# Patient Record
Sex: Female | Born: 1970
Health system: Southern US, Community
[De-identification: ages and names within clinical notes are randomized; demographics above are authoritative.]

## PROBLEM LIST (undated history)

## (undated) DIAGNOSIS — IMO0002 Reserved for concepts with insufficient information to code with codable children: Secondary | ICD-10-CM

## (undated) DIAGNOSIS — Z789 Other specified health status: Secondary | ICD-10-CM

## (undated) DIAGNOSIS — T85618A Breakdown (mechanical) of other specified internal prosthetic devices, implants and grafts, initial encounter: Secondary | ICD-10-CM

## (undated) DIAGNOSIS — R339 Retention of urine, unspecified: Secondary | ICD-10-CM

## (undated) DIAGNOSIS — M1990 Unspecified osteoarthritis, unspecified site: Secondary | ICD-10-CM

## (undated) DIAGNOSIS — R112 Nausea with vomiting, unspecified: Secondary | ICD-10-CM

## (undated) DIAGNOSIS — Z973 Presence of spectacles and contact lenses: Secondary | ICD-10-CM

## (undated) DIAGNOSIS — Z9889 Other specified postprocedural states: Secondary | ICD-10-CM

## (undated) DIAGNOSIS — J189 Pneumonia, unspecified organism: Secondary | ICD-10-CM

## (undated) DIAGNOSIS — Z8679 Personal history of other diseases of the circulatory system: Secondary | ICD-10-CM

## (undated) DIAGNOSIS — E876 Hypokalemia: Secondary | ICD-10-CM

## (undated) DIAGNOSIS — D3501 Benign neoplasm of right adrenal gland: Secondary | ICD-10-CM

## (undated) DIAGNOSIS — K219 Gastro-esophageal reflux disease without esophagitis: Secondary | ICD-10-CM

## (undated) DIAGNOSIS — G894 Chronic pain syndrome: Secondary | ICD-10-CM

## (undated) DIAGNOSIS — Z87442 Personal history of urinary calculi: Secondary | ICD-10-CM

## (undated) DIAGNOSIS — N319 Neuromuscular dysfunction of bladder, unspecified: Secondary | ICD-10-CM

## (undated) DIAGNOSIS — T85698A Other mechanical complication of other specified internal prosthetic devices, implants and grafts, initial encounter: Secondary | ICD-10-CM

## (undated) DIAGNOSIS — U071 COVID-19: Secondary | ICD-10-CM

## (undated) DIAGNOSIS — J45909 Unspecified asthma, uncomplicated: Secondary | ICD-10-CM

## (undated) DIAGNOSIS — E039 Hypothyroidism, unspecified: Secondary | ICD-10-CM

## (undated) DIAGNOSIS — Z8781 Personal history of (healed) traumatic fracture: Secondary | ICD-10-CM

## (undated) HISTORY — DX: COVID-19: U07.1

## (undated) HISTORY — DX: Hypothyroidism, unspecified: E03.9

## (undated) HISTORY — PX: INTERSTIM IMPLANT PLACEMENT: SHX5130

## (undated) HISTORY — PX: CARDIAC ELECTROPHYSIOLOGY STUDY AND ABLATION: SHX1294

---

## 1992-08-21 HISTORY — PX: APPENDECTOMY: SHX54

## 1996-08-21 HISTORY — PX: TONSILLECTOMY: SUR1361

## 1999-08-22 HISTORY — PX: LAPAROSCOPIC CHOLECYSTECTOMY: SUR755

## 2001-08-21 DIAGNOSIS — E278 Other specified disorders of adrenal gland: Secondary | ICD-10-CM | POA: Insufficient documentation

## 2001-08-21 HISTORY — PX: TOTAL VAGINAL HYSTERECTOMY: SHX2548

## 2001-08-21 HISTORY — PX: VAGINAL HYSTERECTOMY: SUR661

## 2005-09-23 ENCOUNTER — Ambulatory Visit: Payer: Self-pay

## 2005-09-29 DIAGNOSIS — G894 Chronic pain syndrome: Secondary | ICD-10-CM | POA: Insufficient documentation

## 2005-09-29 DIAGNOSIS — E039 Hypothyroidism, unspecified: Secondary | ICD-10-CM | POA: Insufficient documentation

## 2005-09-29 DIAGNOSIS — E876 Hypokalemia: Secondary | ICD-10-CM | POA: Insufficient documentation

## 2005-09-29 DIAGNOSIS — J45909 Unspecified asthma, uncomplicated: Secondary | ICD-10-CM | POA: Insufficient documentation

## 2006-02-20 ENCOUNTER — Emergency Department: Payer: Self-pay | Admitting: Emergency Medicine

## 2006-03-09 ENCOUNTER — Other Ambulatory Visit: Payer: Self-pay

## 2006-03-09 ENCOUNTER — Emergency Department: Payer: Self-pay | Admitting: Emergency Medicine

## 2006-03-14 ENCOUNTER — Ambulatory Visit: Payer: Self-pay | Admitting: Family Medicine

## 2006-03-16 ENCOUNTER — Ambulatory Visit: Payer: Self-pay | Admitting: Family Medicine

## 2006-03-21 ENCOUNTER — Ambulatory Visit: Payer: Self-pay | Admitting: Family Medicine

## 2006-03-28 ENCOUNTER — Ambulatory Visit: Payer: Self-pay | Admitting: Family Medicine

## 2006-04-05 ENCOUNTER — Ambulatory Visit: Payer: Self-pay | Admitting: Family Medicine

## 2006-04-24 ENCOUNTER — Ambulatory Visit: Payer: Self-pay | Admitting: Family Medicine

## 2006-04-24 DIAGNOSIS — R002 Palpitations: Secondary | ICD-10-CM | POA: Insufficient documentation

## 2006-05-08 ENCOUNTER — Ambulatory Visit: Payer: Self-pay | Admitting: Family Medicine

## 2006-05-11 ENCOUNTER — Emergency Department: Payer: Self-pay | Admitting: Emergency Medicine

## 2006-05-11 ENCOUNTER — Other Ambulatory Visit: Payer: Self-pay

## 2006-05-15 ENCOUNTER — Encounter: Payer: Self-pay | Admitting: Family Medicine

## 2006-05-17 ENCOUNTER — Inpatient Hospital Stay: Payer: Self-pay | Admitting: Internal Medicine

## 2006-05-17 ENCOUNTER — Other Ambulatory Visit: Payer: Self-pay

## 2006-05-18 ENCOUNTER — Other Ambulatory Visit: Payer: Self-pay

## 2006-05-21 ENCOUNTER — Encounter: Payer: Self-pay | Admitting: Family Medicine

## 2006-06-21 ENCOUNTER — Encounter: Payer: Self-pay | Admitting: Family Medicine

## 2006-07-21 ENCOUNTER — Encounter: Payer: Self-pay | Admitting: Family Medicine

## 2006-08-21 ENCOUNTER — Encounter: Payer: Self-pay | Admitting: Family Medicine

## 2006-09-21 ENCOUNTER — Encounter: Payer: Self-pay | Admitting: Family Medicine

## 2006-11-01 ENCOUNTER — Encounter: Payer: Self-pay | Admitting: Family Medicine

## 2006-11-20 ENCOUNTER — Encounter: Payer: Self-pay | Admitting: Family Medicine

## 2006-12-18 ENCOUNTER — Ambulatory Visit: Payer: Self-pay | Admitting: Nephrology

## 2007-04-18 ENCOUNTER — Encounter: Payer: Self-pay | Admitting: Family Medicine

## 2007-04-22 ENCOUNTER — Encounter: Payer: Self-pay | Admitting: Family Medicine

## 2007-10-18 ENCOUNTER — Other Ambulatory Visit: Payer: Self-pay

## 2007-10-18 ENCOUNTER — Ambulatory Visit: Payer: Self-pay | Admitting: Unknown Physician Specialty

## 2007-10-25 ENCOUNTER — Ambulatory Visit: Payer: Self-pay | Admitting: Unknown Physician Specialty

## 2010-04-21 ENCOUNTER — Ambulatory Visit: Payer: Self-pay | Admitting: Cardiothoracic Surgery

## 2010-04-22 ENCOUNTER — Ambulatory Visit: Payer: Self-pay | Admitting: Otolaryngology

## 2010-04-26 ENCOUNTER — Emergency Department: Payer: Self-pay | Admitting: Emergency Medicine

## 2010-05-13 ENCOUNTER — Ambulatory Visit: Payer: Self-pay | Admitting: Specialist

## 2010-05-18 ENCOUNTER — Ambulatory Visit: Payer: Self-pay | Admitting: Cardiothoracic Surgery

## 2010-06-24 ENCOUNTER — Emergency Department: Payer: Self-pay | Admitting: Emergency Medicine

## 2011-06-29 DIAGNOSIS — R0600 Dyspnea, unspecified: Secondary | ICD-10-CM | POA: Insufficient documentation

## 2011-06-29 DIAGNOSIS — R911 Solitary pulmonary nodule: Secondary | ICD-10-CM | POA: Insufficient documentation

## 2011-09-27 ENCOUNTER — Other Ambulatory Visit: Payer: Self-pay | Admitting: Urology

## 2011-09-29 ENCOUNTER — Encounter (HOSPITAL_BASED_OUTPATIENT_CLINIC_OR_DEPARTMENT_OTHER): Payer: Self-pay | Admitting: *Deleted

## 2011-10-02 ENCOUNTER — Encounter (HOSPITAL_BASED_OUTPATIENT_CLINIC_OR_DEPARTMENT_OTHER): Payer: Self-pay | Admitting: *Deleted

## 2011-10-02 NOTE — Progress Notes (Signed)
NPO AFTER MN. ARRIVES AT 0945. NEEDS ISTAT. CURRENT EKG, NOTE, STRESS TEST AND ECHO TO BE FAXED FROM [facility].  WILL TAKE GABAPENTIN, ZANTAC, AND SYNTHROID AM OF SURG. W/ SIP OF WATER (AND BRING INHALER).

## 2011-10-04 NOTE — H&P (Signed)
History of Present Illness   Kayla Curry again said that when the device was turned off she had little pain.  She understands the lead is currently on her left side and I palpated the generator on her low right side. My plan is to remove the generator and left lead and we talked about pros, cons, risks and also retained fragments. Unless it is very easy, I am not going to try to remove the right side. I am going to try to place the lead on the right away from the sensitized area and make certain I test sensation intraoperatively.  Review of systems: No change in bowel or neurologic status.   She understands that our goal is to get her voiding and not have pain. She understands that this cannot be guaranteed. She says the right side used to work in the past for her voiding dysfunction.    Past Medical History Problems  1. History of  Asthma 493.90 2. History of  Cardiac Cath Indications: Cardiac Arrhythmia 3. History of  Esophageal Reflux 530.81 4. History of  Hypothyroidism 244.9 5. History of  Murmurs 785.2  Surgical History Problems  1. History of  Appendectomy 2. History of  Gallbladder Surgery 3. History of  Heart Surgery 4. History of  Hysterectomy V45.77 5. History of  Tonsillectomy 6. History of  Tubal Ligation V25.2  Current Meds 1. Flexeril 10 MG Oral Tablet; Therapy: (Recorded:16Jan2013) to 2. Gabapentin 600 MG Oral Tablet; Therapy: (Recorded:16Jan2013) to 3. Lunesta 3 MG Oral Tablet; Therapy: (Recorded:16Jan2013) to 4. Nitrofurantoin Macrocrystal 100 MG Oral Capsule; Take 1 capsule twice daily; Therapy:  05Feb2013 to (Last Rx:05Feb2013)  Requested for: 05Feb2013; Status: ACTIVE - Retrospective  Authorization 5. Promethazine HCl 25 MG Oral Tablet; TAKE 1 TABLET Every 8 hours; Therapy: 29Jan2013 to  (Last Rx:29Jan2013)  Requested for: 29Jan2013 6. Ranitidine HCl 300 MG Oral Tablet; Therapy: (Recorded:16Jan2013) to 7. Spironolactone TABS; Therapy: (Recorded:16Jan2013)  to 8. Synthroid 75 MCG Oral Tablet; Therapy: (Recorded:16Jan2013) to  Allergies Medication  1. Levaquin TABS 2. Potassimin TABS 3. Advair Diskus MISC 4. Albuterol Powder 5. Ativan TABS 6. Atrovent HFA AERS 7. Bethanechol Chloride TABS 8. Compazine TABS 9. FLU 10. Symbicort AERO 11. Xopenex HFA AERO  Family History Problems  1. Family history of  Family Health Status Number Of Children 1 son1 daughter 2. Paternal history of  Hypertension V17.49  Social History Problems  1. Caffeine Use 4 drinks daily 2. Marital History - Currently Married 3. 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 History of  Alcohol Use 6. History of  Tobacco Use  Vitals Vital Signs [Data Includes: Last 1 Day]  05Feb2013 02:09PM  Blood Pressure: 136 / 85 Temperature: 98.8 F Heart Rate: 108  Assessment Assessed  1. Incomplete Emptying Of Bladder 788.21  Plan Incomplete Emptying Of Bladder (788.21)  1. Follow-up Schedule Surgery Office  Follow-up  Done: 05Feb2013  Discussion/Summary   [name] will be scheduled as soon as possible to replace her Interstim. We will proceed accordingly. We will make sure that she knows today that she had a positive urine culture and a prescription was sent to the pharmacy.  After a thorough review of the management options for the patient's condition the patient  elected to proceed with surgical therapy as noted above. We have discussed the potential benefits and risks of the procedure, side effects of the proposed treatment, the likelihood of the patient achieving the goals of the procedure, and any potential problems that might  occur during the procedure or recuperation. Informed consent has been obtained.

## 2011-10-05 ENCOUNTER — Ambulatory Visit (HOSPITAL_BASED_OUTPATIENT_CLINIC_OR_DEPARTMENT_OTHER): Payer: PRIVATE HEALTH INSURANCE | Admitting: Anesthesiology

## 2011-10-05 ENCOUNTER — Encounter (HOSPITAL_BASED_OUTPATIENT_CLINIC_OR_DEPARTMENT_OTHER): Payer: Self-pay | Admitting: *Deleted

## 2011-10-05 ENCOUNTER — Encounter (HOSPITAL_BASED_OUTPATIENT_CLINIC_OR_DEPARTMENT_OTHER): Payer: Self-pay | Admitting: Anesthesiology

## 2011-10-05 ENCOUNTER — Ambulatory Visit (HOSPITAL_BASED_OUTPATIENT_CLINIC_OR_DEPARTMENT_OTHER)
Admission: RE | Admit: 2011-10-05 | Discharge: 2011-10-05 | Disposition: A | Discharge status: Home / Self Care | Payer: PRIVATE HEALTH INSURANCE | Source: Ambulatory Visit | Attending: Urology | Admitting: Urology

## 2011-10-05 ENCOUNTER — Other Ambulatory Visit: Payer: Self-pay

## 2011-10-05 ENCOUNTER — Ambulatory Visit (HOSPITAL_COMMUNITY): Payer: PRIVATE HEALTH INSURANCE

## 2011-10-05 ENCOUNTER — Encounter (HOSPITAL_BASED_OUTPATIENT_CLINIC_OR_DEPARTMENT_OTHER)
Admission: RE | Disposition: A | Discharge status: Home / Self Care | Payer: Self-pay | Source: Ambulatory Visit | Attending: Urology

## 2011-10-05 DIAGNOSIS — K219 Gastro-esophageal reflux disease without esophagitis: Secondary | ICD-10-CM | POA: Insufficient documentation

## 2011-10-05 DIAGNOSIS — T8389XA Other specified complication of genitourinary prosthetic devices, implants and grafts, initial encounter: Secondary | ICD-10-CM | POA: Insufficient documentation

## 2011-10-05 DIAGNOSIS — E039 Hypothyroidism, unspecified: Secondary | ICD-10-CM | POA: Insufficient documentation

## 2011-10-05 DIAGNOSIS — J45909 Unspecified asthma, uncomplicated: Secondary | ICD-10-CM | POA: Insufficient documentation

## 2011-10-05 DIAGNOSIS — R339 Retention of urine, unspecified: Secondary | ICD-10-CM | POA: Insufficient documentation

## 2011-10-05 DIAGNOSIS — Y831 Surgical operation with implant of artificial internal device as the cause of abnormal reaction of the patient, or of later complication, without mention of misadventure at the time of the procedure: Secondary | ICD-10-CM | POA: Insufficient documentation

## 2011-10-05 DIAGNOSIS — Z01812 Encounter for preprocedural laboratory examination: Secondary | ICD-10-CM | POA: Insufficient documentation

## 2011-10-05 DIAGNOSIS — N949 Unspecified condition associated with female genital organs and menstrual cycle: Secondary | ICD-10-CM | POA: Insufficient documentation

## 2011-10-05 DIAGNOSIS — Z79899 Other long term (current) drug therapy: Secondary | ICD-10-CM | POA: Insufficient documentation

## 2011-10-05 DIAGNOSIS — Z01810 Encounter for preprocedural cardiovascular examination: Secondary | ICD-10-CM | POA: Insufficient documentation

## 2011-10-05 HISTORY — DX: Reserved for concepts with insufficient information to code with codable children: IMO0002

## 2011-10-05 HISTORY — DX: Personal history of other diseases of the circulatory system: Z86.79

## 2011-10-05 HISTORY — DX: Gastro-esophageal reflux disease without esophagitis: K21.9

## 2011-10-05 HISTORY — DX: Personal history of (healed) traumatic fracture: Z87.81

## 2011-10-05 HISTORY — DX: Other specified postprocedural states: Z98.890

## 2011-10-05 HISTORY — PX: INTERSTIM IMPLANT PLACEMENT: SHX5130

## 2011-10-05 HISTORY — DX: Retention of urine, unspecified: R33.9

## 2011-10-05 HISTORY — DX: Nausea with vomiting, unspecified: R11.2

## 2011-10-05 SURGERY — INSERTION, SACRAL NERVE STIMULATOR, INTERSTIM, STAGE 1
Anesthesia: Monitor Anesthesia Care | Site: Back | Wound class: Clean

## 2011-10-05 MED ORDER — ONDANSETRON HCL 4 MG/2ML IJ SOLN
INTRAMUSCULAR | Status: DC | PRN
  Administered 2011-10-05: 4 mg via INTRAVENOUS

## 2011-10-05 MED ORDER — OXYCODONE-ACETAMINOPHEN 10-650 MG PO TABS: 1.0000 | ORAL_TABLET | Freq: Four times a day (QID) | ORAL | Status: AC | PRN

## 2011-10-05 MED ORDER — LACTATED RINGERS IV SOLN
INTRAVENOUS | Status: DC
  Administered 2011-10-05 (×3): via INTRAVENOUS

## 2011-10-05 MED ORDER — PROPOFOL 10 MG/ML IV EMUL
INTRAVENOUS | Status: DC | PRN
  Administered 2011-10-05: 50 ug/kg/min via INTRAVENOUS
  Administered 2011-10-05: 75 ug/kg/min via INTRAVENOUS

## 2011-10-05 MED ORDER — CEPHALEXIN 250 MG PO CAPS: 250.0000 mg | ORAL_CAPSULE | Freq: Three times a day (TID) | ORAL | Status: AC

## 2011-10-05 MED ORDER — LIDOCAINE HCL (CARDIAC) 20 MG/ML IV SOLN
INTRAVENOUS | Status: DC | PRN
  Administered 2011-10-05: 60 mg via INTRAVENOUS

## 2011-10-05 MED ORDER — FENTANYL CITRATE 0.05 MG/ML IJ SOLN
INTRAMUSCULAR | Status: DC | PRN
  Administered 2011-10-05 (×5): 25 ug via INTRAVENOUS
  Administered 2011-10-05: 50 ug via INTRAVENOUS
  Administered 2011-10-05: 25 ug via INTRAVENOUS

## 2011-10-05 MED ORDER — MIDAZOLAM HCL 5 MG/5ML IJ SOLN
INTRAMUSCULAR | Status: DC | PRN
  Administered 2011-10-05: .5 mg via INTRAVENOUS
  Administered 2011-10-05: 1 mg via INTRAVENOUS
  Administered 2011-10-05: 0.5 mg via INTRAVENOUS

## 2011-10-05 MED ORDER — PROMETHAZINE HCL 25 MG/ML IJ SOLN
6.2500 mg | INTRAMUSCULAR | Status: DC | PRN
  Administered 2011-10-05: 6.25 mg via INTRAVENOUS

## 2011-10-05 MED ORDER — BUPIVACAINE-EPINEPHRINE 0.5% -1:200000 IJ SOLN
INTRAMUSCULAR | Status: DC | PRN
  Administered 2011-10-05: 4 mL

## 2011-10-05 MED ORDER — FENTANYL CITRATE 0.05 MG/ML IJ SOLN
25.0000 ug | INTRAMUSCULAR | Status: DC | PRN
  Administered 2011-10-05: 50 ug via INTRAVENOUS

## 2011-10-05 MED ORDER — STERILE WATER FOR IRRIGATION IR SOLN
Status: DC | PRN
  Administered 2011-10-05: 500 mL

## 2011-10-05 MED ORDER — CEFAZOLIN SODIUM 1-5 GM-% IV SOLN
1.0000 g | INTRAVENOUS | Status: AC
  Administered 2011-10-05: 1 g via INTRAVENOUS

## 2011-10-05 MED ORDER — PROPOFOL 10 MG/ML IV EMUL
INTRAVENOUS | Status: DC | PRN
  Administered 2011-10-05: 40 mg via INTRAVENOUS
  Administered 2011-10-05: 30 mg via INTRAVENOUS

## 2011-10-05 MED ORDER — LIDOCAINE-EPINEPHRINE (PF) 1 %-1:200000 IJ SOLN
INTRAMUSCULAR | Status: DC | PRN
  Administered 2011-10-05: 24 mL via INTRADERMAL

## 2011-10-05 SURGICAL SUPPLY — 54 items
BAG URINE DRAINAGE (UROLOGICAL SUPPLIES) ×2 IMPLANT
BAG URINE LEG 500ML (DRAIN) IMPLANT
BANDAGE ADHESIVE 1X3 (GAUZE/BANDAGES/DRESSINGS) ×4 IMPLANT
BENZOIN TINCTURE PRP APPL 2/3 (GAUZE/BANDAGES/DRESSINGS) ×4 IMPLANT
BLADE HEX COATED 2.75 (ELECTRODE) ×2 IMPLANT
BLADE SURG 15 STRL LF DISP TIS (BLADE) ×1 IMPLANT
BLADE SURG 15 STRL SS (BLADE) ×1
CATH FOLEY 2WAY SLVR  5CC 16FR (CATHETERS) ×1
CATH FOLEY 2WAY SLVR 5CC 16FR (CATHETERS) ×1 IMPLANT
CLOSURE STERI STRIP 1/2 X4 (GAUZE/BANDAGES/DRESSINGS) ×2 IMPLANT
CLOTH BEACON ORANGE TIMEOUT ST (SAFETY) ×2 IMPLANT
COVER MAYO STAND STRL (DRAPES) ×2 IMPLANT
COVER PROBE W GEL 5X96 (DRAPES) ×2 IMPLANT
COVER TABLE BACK 60X90 (DRAPES) ×2 IMPLANT
DERMABOND ADVANCED (GAUZE/BANDAGES/DRESSINGS)
DERMABOND ADVANCED .7 DNX12 (GAUZE/BANDAGES/DRESSINGS) IMPLANT
DRAPE C-ARM 42X72 X-RAY (DRAPES) ×2 IMPLANT
DRAPE INCISE 23X17 IOBAN STRL (DRAPES) ×1
DRAPE INCISE IOBAN 23X17 STRL (DRAPES) ×1 IMPLANT
DRAPE LAPAROSCOPIC ABDOMINAL (DRAPES) ×2 IMPLANT
DRAPE LG THREE QUARTER DISP (DRAPES) ×4 IMPLANT
DRESSING TELFA 8X3 (GAUZE/BANDAGES/DRESSINGS) ×2 IMPLANT
DRSG TEGADERM 4X4.75 (GAUZE/BANDAGES/DRESSINGS) ×2 IMPLANT
ELECT REM PT RETURN 9FT ADLT (ELECTROSURGICAL) ×2
ELECTRODE REM PT RTRN 9FT ADLT (ELECTROSURGICAL) ×1 IMPLANT
GAUZE SPONGE 4X4 12PLY STRL LF (GAUZE/BANDAGES/DRESSINGS) ×4 IMPLANT
GLOVE BIO SURGEON STRL SZ7.5 (GLOVE) ×2 IMPLANT
GOWN PREVENTION PLUS LG XLONG (DISPOSABLE) ×2 IMPLANT
GOWN STRL REIN XL XLG (GOWN DISPOSABLE) ×2 IMPLANT
HOLDER FOLEY CATH W/STRAP (MISCELLANEOUS) ×2 IMPLANT
INTRODUCER GUIDE DILATR SHEATH (SET/KITS/TRAYS/PACK) ×2 IMPLANT
LEAD (Lead) ×2 IMPLANT
MEDTRONIC LEAD INTRO KIT ×2 IMPLANT
NEEDLE FORAMEN 20GA 3.5  9CM (NEEDLE) IMPLANT
NEEDLE FORAMEN 20GA 5  12.5CM (NEEDLE) IMPLANT
NEEDLE HYPO 22GX1.5 SAFETY (NEEDLE) ×2 IMPLANT
PACK BASIN DAY SURGERY FS (CUSTOM PROCEDURE TRAY) ×2 IMPLANT
PENCIL BUTTON HOLSTER BLD 10FT (ELECTRODE) IMPLANT
PROGRAMMER ANTENNA EXT (UROLOGICAL SUPPLIES) ×2 IMPLANT
PROGRAMMER STIMUL 2.2X1.1X3.7 (UROLOGICAL SUPPLIES) ×2 IMPLANT
STAPLER VISISTAT 35W (STAPLE) IMPLANT
STIMULATOR INTERSTIM 2X1.7X.3 (Orthopedic Implant) ×2 IMPLANT
STRIP CLOSURE SKIN 1/2X4 (GAUZE/BANDAGES/DRESSINGS) ×2 IMPLANT
SUT SILK 2 0 (SUTURE) ×1
SUT SILK 2-0 18XBRD TIE 12 (SUTURE) ×1 IMPLANT
SUT VIC AB 3-0 SH 27 (SUTURE) ×2
SUT VIC AB 3-0 SH 27X BRD (SUTURE) ×2 IMPLANT
SUT VICRYL 4-0 PS2 18IN ABS (SUTURE) ×10 IMPLANT
SYR BULB IRRIGATION 50ML (SYRINGE) ×2 IMPLANT
SYR CONTROL 10ML LL (SYRINGE) ×2 IMPLANT
SYRINGE 10CC LL (SYRINGE) ×2 IMPLANT
TOWEL OR 17X24 6PK STRL BLUE (TOWEL DISPOSABLE) ×4 IMPLANT
TRAY DSU PREP LF (CUSTOM PROCEDURE TRAY) ×2 IMPLANT
WATER STERILE IRR 500ML POUR (IV SOLUTION) ×2 IMPLANT

## 2011-10-05 NOTE — Anesthesia Preprocedure Evaluation (Addendum)
Anesthesia Evaluation  Patient identified by MRN, date of birth, ID band Patient awake    Reviewed: Allergy & Precautions, H&P , NPO status , Patient's Chart, lab work & pertinent test results, reviewed documented beta blocker date and time   History of Anesthesia Complications (+) PONV  Airway Mallampati: II TM Distance: >3 FB Neck ROM: Full    Dental  (+) Teeth Intact   Pulmonary asthma ,  Inhaler prn clear to auscultation        Cardiovascular Regular Normal S/p ablation 2005 for SVT, no recurrence, no Rx EKG-NSR, short PR    Neuro/Psych Negative Neurological ROS  Negative Psych ROS   GI/Hepatic negative GI ROS, Neg liver ROS,   Endo/Other  Hypothyroidism Thyroid replacement  Renal/GU negative Renal ROS   Urinary retention Pelvic pain    Musculoskeletal negative musculoskeletal ROS (+)   Abdominal   Peds negative pediatric ROS (+)  Hematology negative hematology ROS (+)   Anesthesia Other Findings   Reproductive/Obstetrics negative OB ROS                          Anesthesia Physical Anesthesia Plan  ASA: II  Anesthesia Plan: MAC   Post-op Pain Management:    Induction: Intravenous  Airway Management Planned: Mask  Additional Equipment:   Intra-op Plan:   Post-operative Plan:   Informed Consent: I have reviewed the patients History and Physical, chart, labs and discussed the procedure including the risks, benefits and alternatives for the proposed anesthesia with the patient or authorized representative who has indicated his/her understanding and acceptance.   Dental advisory given  Plan Discussed with: CRNA and Surgeon  Anesthesia Plan Comments:         Anesthesia Quick Evaluation

## 2011-10-05 NOTE — Progress Notes (Signed)
Three times request for information from [facility] was requested.  The signed release form was sent also a phone call was made to let [facility] know that the request was sent.  [facility] called this afternoon and informed us that they never received the request even though they had [facility] phone number and fax number Kayla Curry [email or web address]

## 2011-10-05 NOTE — Interval H&P Note (Signed)
History and Physical Interval Note:  10/05/2011 7:20 AM  Kayla Curry  has presented today for surgery, with the diagnosis of URINARY RETENTION  The various methods of treatment have been discussed with the patient and family. After consideration of risks, benefits and other options for treatment, the patient has consented to  Procedure(s) (LRB): INTERSTIM IMPLANT FIRST STAGE (N/A) INTERSTIM IMPLANT SECOND STAGE (N/A) as a surgical intervention .  The patients' history has been reviewed, patient examined, no change in status, stable for surgery.  I have reviewed the patients' chart and labs.  Questions were answered to the patient's satisfaction.     Anjela Cassara A

## 2011-10-05 NOTE — Transfer of Care (Signed)
Immediate Anesthesia Transfer of Care Note  Patient: Kayla Curry  Procedure(s) Performed: Procedure(s) (LRB): INTERSTIM IMPLANT FIRST STAGE (N/A) INTERSTIM IMPLANT SECOND STAGE (N/A)  Patient Location: PACU  Anesthesia Type: General  Level of Consciousness: awake, sedated, patient cooperative and responds to stimulation  Airway & Oxygen Therapy: Patient Spontanous Breathing and Patient connected to face mask oxygen  Post-op Assessment: Report given to PACU RN, Post -op Vital signs reviewed and stable and Patient moving all extremities  Post vital signs: Reviewed and stable  Complications: No apparent anesthesia complications

## 2011-10-05 NOTE — Op Note (Signed)
Preoperative diagnosis: Malfunctioning InterStim plus urinary retention and pelvic pain Postoperative diagnosis: Malfunctioning InterStim plus urinary retention and pelvic pain Surgery: Removal of InterStim; removal of retained lead; stage I and stage II InterStim placement; impedance check Surgeon: Dr. Lorin Picket Chaeli Judy  Miss Notaro has a complicated InterStim history. I can easily palpate the generator on the right side. Her lead is on the left side. She hasn't an active retained lead on the right that was almost a cut at the level of a previous left-sided pocket. She consented to the above procedure. Preoperative antibiotics were given. MAC anesthesia was utilized. Fluoroscopy was utilized in AP and lateral view to get hard copies of the described InterStim.  After instilling of 50% Marcaine/lidocaine mixture of approximately 15 cc I opened her I buttock incision and entered the pseudocapsule easily removing the generator. I cut the lead and placed a hemostat on the retained part.  Using fluoroscopy and wiggling the lead and utilizing a previous skin incision I marked where her active left-sided lead was below the skin. I made a 2 cm incision and later a little bit longer cell I could easily find the lead and dissect sharply and bluntly down to the bone table. I used lateral fluoroscopy to make certain I had gone to the bone table with a hemostat as well as direct palpation. I was able to remove the entire lead under fluoroscopic guidance.  The same technique was used for the right-sided retained lead making another 2-3 cm incision. It was removed in its entirety  Through the right side incision and utilizing fluoroscopy I placed a short foramen needle through the S3 foramina. She had excellent toe and motor bellow response below 2-1/2. I tried checks injury after waking her up for several minutes but she did not feel a lot. I did not feel it the position could be improved.  I replaced the core  needle with the usual technique removing the framing needle. I placed the white trocar to the appropriate depth. I removed his inner sheath. I placed the lead to appropriate depth pulling back the white sheath to the most proximal white line. She excellent motor responses at all 4 positions  I removed the white trocar checking the position and was unchanged. All 4 positions were checked again with the same excellent responses  I used the tunneling device to bring the lead laterally out through the lateral incision. I hooked it to the generator appropriately. I laid in the generator in its pocket.  Impedance check was done under sterile technique and all 4 impedance checks were normal.  Irrigation was used for all incisions. 3-0 Vicryl for subcutaneous tissue and 4-0 interrupted was used for the midline incisions and a 4-0 subcuticular for the right buttock incision. Sterile dressings were applied  I was pleased with the procedure and x-rays were taken ended dictation

## 2011-10-05 NOTE — Anesthesia Postprocedure Evaluation (Signed)
  Anesthesia Post-op Note  Patient: Kayla Curry  Procedure(s) Performed: Procedure(s) (LRB): INTERSTIM IMPLANT FIRST STAGE (N/A) INTERSTIM IMPLANT SECOND STAGE (N/A)  Patient Location: PACU  Anesthesia Type: MAC  Level of Consciousness: oriented and sedated  Airway and Oxygen Therapy: Patient Spontanous Breathing  Post-op Pain: mild  Post-op Assessment: Post-op Vital signs reviewed, Patient's Cardiovascular Status Stable, Respiratory Function Stable and Patent Airway  Post-op Vital Signs: stable  Complications: No apparent anesthesia complications

## 2011-10-05 NOTE — Discharge Instructions (Signed)
I have reviewed discharge instructions in detail with the patient. They will follow-up with me or their physician as scheduled. My nurse will also be calling the patients as per protocol.   

## 2011-10-06 ENCOUNTER — Encounter (HOSPITAL_BASED_OUTPATIENT_CLINIC_OR_DEPARTMENT_OTHER): Payer: Self-pay | Admitting: Urology

## 2011-10-06 NOTE — Progress Notes (Signed)
Ok to call pt per pt

## 2011-12-05 ENCOUNTER — Encounter: Payer: Self-pay | Admitting: Family Medicine

## 2011-12-20 ENCOUNTER — Encounter: Payer: Self-pay | Admitting: Family Medicine

## 2012-01-20 ENCOUNTER — Encounter: Payer: Self-pay | Admitting: Family Medicine

## 2012-05-01 ENCOUNTER — Encounter: Payer: Self-pay | Admitting: Gastroenterology

## 2012-05-28 ENCOUNTER — Other Ambulatory Visit (INDEPENDENT_AMBULATORY_CARE_PROVIDER_SITE_OTHER): Payer: PRIVATE HEALTH INSURANCE

## 2012-05-28 ENCOUNTER — Ambulatory Visit (INDEPENDENT_AMBULATORY_CARE_PROVIDER_SITE_OTHER): Payer: PRIVATE HEALTH INSURANCE | Admitting: Gastroenterology

## 2012-05-28 ENCOUNTER — Encounter: Payer: Self-pay | Admitting: Gastroenterology

## 2012-05-28 VITALS — BP 122/70 | HR 67 | Ht 62.0 in | Wt 116.4 lb

## 2012-05-28 DIAGNOSIS — R112 Nausea with vomiting, unspecified: Secondary | ICD-10-CM

## 2012-05-28 DIAGNOSIS — R1013 Epigastric pain: Secondary | ICD-10-CM

## 2012-05-28 LAB — CBC WITH DIFFERENTIAL/PLATELET
Basophils Absolute: 0 10*3/uL (ref 0.0–0.1)
Eosinophils Relative: 0.8 % (ref 0.0–5.0)
Lymphocytes Relative: 25.2 % (ref 12.0–46.0)
Monocytes Relative: 2.6 % — ABNORMAL LOW (ref 3.0–12.0)
Neutrophils Relative %: 71 % (ref 43.0–77.0)
Platelets: 243 10*3/uL (ref 150.0–400.0)
RDW: 12.4 % (ref 11.5–14.6)
WBC: 6.9 10*3/uL (ref 4.5–10.5)

## 2012-05-28 LAB — HEPATIC FUNCTION PANEL
ALT: 19 U/L (ref 0–35)
AST: 22 U/L (ref 0–37)
Alkaline Phosphatase: 35 U/L — ABNORMAL LOW (ref 39–117)
Bilirubin, Direct: 0 mg/dL (ref 0.0–0.3)
Total Bilirubin: 0.5 mg/dL (ref 0.3–1.2)

## 2012-05-28 LAB — BASIC METABOLIC PANEL
BUN: 9 mg/dL (ref 6–23)
CO2: 23 mEq/L (ref 19–32)
Calcium: 9.5 mg/dL (ref 8.4–10.5)
Creatinine, Ser: 0.7 mg/dL (ref 0.4–1.2)
Glucose, Bld: 67 mg/dL — ABNORMAL LOW (ref 70–99)
Sodium: 139 mEq/L (ref 135–145)

## 2012-05-28 NOTE — Progress Notes (Signed)
History of Present Illness: This is a 42 year old female who relates a 6-8 week history of intermittent epigastric pain which has worsened over the past few weeks. It has been associated with intermittent nausea and vomiting for the past 4-5 weeks. She's been treated with ranitidine 300 mg daily for GERD for several years. She tried Prilosec and Prevacid recently with no change in symptoms. She denies aspirin and NSAID usage. Denies weight loss, constipation, diarrhea, change in stool caliber, melena, hematochezia, dysphagia, chest pain.  Review of Systems: Pertinent positive and negative review of systems were noted in the above HPI section. All other review of systems were otherwise negative.  Current Medications, Allergies, Past Medical History, Past Surgical History, Family History and Social History were reviewed in [employer] record.  Physical Exam: General: Well developed , well nourished, no acute distress Head: Normocephalic and atraumatic Eyes:  sclerae anicteric, EOMI Ears: Normal auditory acuity Mouth: No deformity or lesions Neck: Supple, no masses or thyromegaly Lungs: Clear throughout to auscultation Heart: Regular rate and rhythm; no murmurs, rubs or bruits Abdomen: Soft, moderate epigastric tenderness without rebound or guarding  and non distended. No masses, hepatosplenomegaly or hernias noted. Normal Bowel sounds Musculoskeletal: Symmetrical with no gross deformities  Skin: No lesions on visible extremities Pulses:  Normal pulses noted Extremities: No clubbing, cyanosis, edema or deformities noted Neurological: Alert oriented x 4, grossly nonfocal Cervical Nodes:  No significant cervical adenopathy Inguinal Nodes: No significant inguinal adenopathy Psychological:  Alert and cooperative. Normal mood and affect  Assessment and Recommendations:  1. Epigastric pain with nausea and vomiting. Rule out ulcer disease, gastritis, GERD, cholelithiasis.  Increase ranitidine to 300 mg twice a day. Obtain blood work today. Schedule upper endoscopy. The risks, benefits, and alternatives to endoscopy with possible biopsy and possible dilation were discussed with the patient and they consent to proceed. If the upper endoscopy is nondiagnostic proceed with an abdominal ultrasound.

## 2012-05-28 NOTE — Patient Instructions (Addendum)
Your physician has requested that you go to the basement for the following lab work before leaving today: CBC, Cmet, Lipase, TSH, LFT's.  You have been scheduled for an endoscopy with propofol. Please follow written instructions given to you at your visit today. If you use inhalers (even only as needed), please bring them with you on the day of your procedure.  cc: Mila Merry, MD

## 2012-05-29 ENCOUNTER — Telehealth: Payer: Self-pay | Admitting: *Deleted

## 2012-05-29 ENCOUNTER — Encounter: Payer: Self-pay | Admitting: Gastroenterology

## 2012-05-29 MED ORDER — ONDANSETRON HCL 4 MG PO TABS: ORAL_TABLET | ORAL | Status: DC

## 2012-05-29 MED ORDER — OMEPRAZOLE 40 MG PO CPDR: 40.0000 mg | DELAYED_RELEASE_CAPSULE | Freq: Two times a day (BID) | ORAL | Status: DC

## 2012-05-29 NOTE — Telephone Encounter (Signed)
Initially, when I used Pepto Bismol it helped with the epigastric pain and vomiting but now it is not working and the pain is unbearable today, can you give me any other suggestions to try. Thank Bonita Quin, Darl Pikes Avitabile-[phone]

## 2012-05-29 NOTE — Telephone Encounter (Signed)
Spoke with patient. She was seen yesterday by Dr. Russella Dar. She states she has increased the Ranitidine to BID. She has been taking Pepto Bismol and it had been helping her epigastric pain and vomiting but today it is not helping. States she has vomited x 2 today and feels like she is being punched in the stomach. She has taken Oxycodone today also. She is scheduled for EGD on 06/04/12. Please, advise.

## 2012-05-29 NOTE — Telephone Encounter (Signed)
Change to omeprazole 40 mg po bid, #60, 5 refills (hold ranitidine) Zofran 4 mg 1-2 po q4-6 prn N/V, #30, 2 refills (can also use before meals if this helps) Light diet

## 2012-05-29 NOTE — Telephone Encounter (Signed)
Rx sent. Spoke with patient and gave her Dr. Ardell Isaacs recommendations. She states her insurance will not pay for Zofran and it is expensive. Suggested she get a few pills and see if it helps her. She will think about doing this. She will get the Omeprazole rx.

## 2012-06-04 ENCOUNTER — Telehealth: Payer: Self-pay

## 2012-06-04 ENCOUNTER — Ambulatory Visit (AMBULATORY_SURGERY_CENTER): Payer: PRIVATE HEALTH INSURANCE | Admitting: Gastroenterology

## 2012-06-04 ENCOUNTER — Encounter: Payer: Self-pay | Admitting: Gastroenterology

## 2012-06-04 VITALS — BP 125/75 | HR 77 | Temp 97.8°F | Resp 17 | Ht 62.0 in | Wt 116.0 lb

## 2012-06-04 DIAGNOSIS — R112 Nausea with vomiting, unspecified: Secondary | ICD-10-CM

## 2012-06-04 DIAGNOSIS — R1013 Epigastric pain: Secondary | ICD-10-CM

## 2012-06-04 DIAGNOSIS — R1110 Vomiting, unspecified: Secondary | ICD-10-CM

## 2012-06-04 MED ORDER — SODIUM CHLORIDE 0.9 % IV SOLN: 500.0000 mL | INTRAVENOUS | Status: DC

## 2012-06-04 MED ORDER — ESOMEPRAZOLE MAGNESIUM 40 MG PO CPDR: 40.0000 mg | DELAYED_RELEASE_CAPSULE | Freq: Every day | ORAL | Status: DC

## 2012-06-04 NOTE — Telephone Encounter (Signed)
Per EGD report from 06/04/12, patient to have abdominal US.  It has been scheduled at [facility] for 06/11/12 8:30.  I have left a message for the patient to call back and discuss

## 2012-06-04 NOTE — Patient Instructions (Addendum)
YOU HAD AN ENDOSCOPIC PROCEDURE TODAY AT THE [facility]: Refer to the procedure report that was given to you for any specific questions about what was found during the examination.  If the procedure report does not answer your questions, please call your gastroenterologist to clarify.  If you requested that your care partner not be given the details of your procedure findings, then the procedure report has been included in a sealed envelope for you to review at your convenience later.  YOU SHOULD EXPECT: Some feelings of bloating in the abdomen. Passage of more gas than usual.  Walking can help get rid of the air that was put into your GI tract during the procedure and reduce the bloating. If you had a lower endoscopy (such as a colonoscopy or flexible sigmoidoscopy) you may notice spotting of blood in your stool or on the toilet paper. If you underwent a bowel prep for your procedure, then you may not have a normal bowel movement for a few days.  DIET: Your first meal following the procedure should be a light meal and then it is ok to progress to your normal diet.  A half-sandwich or bowl of soup is an example of a good first meal.  Heavy or fried foods are harder to digest and may make you feel nauseous or bloated.  Likewise meals heavy in dairy and vegetables can cause extra gas to form and this can also increase the bloating.  Drink plenty of fluids but you should avoid alcoholic beverages for 24 hours.  ACTIVITY: Your care partner should take you home directly after the procedure.  You should plan to take it easy, moving slowly for the rest of the day.  You can resume normal activity the day after the procedure however you should NOT DRIVE or use heavy machinery for 24 hours (because of the sedation medicines used during the test).    SYMPTOMS TO REPORT IMMEDIATELY: A gastroenterologist can be reached at any hour.  During normal business hours, 8:30 AM to 5:00 PM Monday through Friday,  call [phone].  After hours and on weekends, please call the GI answering service at [phone] who will take a message and have the physician on call contact you.    Following upper endoscopy (EGD)  Vomiting of blood or coffee ground material  New chest pain or pain under the shoulder blades  Painful or persistently difficult swallowing  New shortness of breath  Fever of 100F or higher  Black, tarry-looking stools  FOLLOW UP: If any biopsies were taken you will be contacted by phone or by letter within the next 1-3 weeks.  Call your gastroenterologist if you have not heard about the biopsies in 3 weeks.  Our staff will call the home number listed on your records the next business day following your procedure to check on you and address any questions or concerns that you may have at that time regarding the information given to you following your procedure. This is a courtesy call and so if there is no answer at the home number and we have not heard from you through the emergency physician on call, we will assume that you have returned to your regular daily activities without incident.  SIGNATURES/CONFIDENTIALITY: You and/or your care partner have signed paperwork which will be entered into your electronic medical record.  These signatures attest to the fact that that the information above on your After Visit Summary has been reviewed and is understood.  Full   responsibility of the confidentiality of this discharge information lies with you and/or your care-partner.   Resume medication. Call office to schedule follow up appt. For 4-6 weeks.

## 2012-06-04 NOTE — Op Note (Signed)
[facility] [address] [place], [address]   ENDOSCOPY PROCEDURE REPORT  PATIENT: Kayla, Curry  MR#: [id number] BIRTHDATE: [birth year] , 41  yrs. old GENDER: Female ENDOSCOPIST: Meryl Dare, MD, [facility] PROCEDURE DATE:  06/04/2012 PROCEDURE:  EGD, diagnostic ASA CLASS:     Class II INDICATIONS:  epigastric pain,  vomiting. MEDICATIONS: MAC sedation, administered by CRNA and propofol (Diprivan) 100mg  IV TOPICAL ANESTHETIC: none DESCRIPTION OF PROCEDURE: After the risks benefits and alternatives of the procedure were thoroughly explained, informed consent was obtained.  The LB [id number] endoscope was introduced through the mouth and advanced to the second portion of the duodenum. Without limitations.  The instrument was slowly withdrawn as the mucosa was fully examined.   ESOPHAGUS: The mucosa of the esophagus appeared normal. STOMACH: The mucosa of the stomach appeared normal.   The gastric folds were normal. DUODENUM: The duodenal mucosa showed no abnormalities in the bulb and second portion of the duodenum.  Retroflexed views revealed no abnormalities.     The scope was then withdrawn from the patient and the procedure completed.  COMPLICATIONS: There were no complications.  ENDOSCOPIC IMPRESSION: 1.   The EGD appeared normal  RECOMMENDATIONS: 1. My office will arrange for you to have an abdominal ultrasound performed.    eSigned:  Meryl Dare, MD, [facility] 06/04/2012 9:48 AM   XB:[id number] Sherrie Mustache, MD

## 2012-06-04 NOTE — Progress Notes (Addendum)
Pt. C/o nausea and dry hieving upon admission to recovery,physcian made aware. N.O. For Zofran 2mg  recived I.V. zofran diluted with 20ml of N.S. And administered slowly over . Pt. Denied any pain during administering medication.pt. Stated it felt a little better and then after reassessing pt. Stated it is not any better, physcian made aware and new order received to give pt. 6 more mg of zofran.zofran administered as order.pt. Stated it feels better. zofran diluted with more than 20ml of N.S. Cool compress applied to forehead and neck throughout stay here in recovery.pt. Stated that it helped a little,but it is coming back. physcian made aware and instructed writer to discharge pt. And instruct pt. To take ppi and eat some food, Instructions given to pt. And family pt. D/C.

## 2012-06-04 NOTE — Progress Notes (Signed)
Patient did not experience any of the following events: a burn prior to discharge; a fall within the facility; wrong site/side/patient/procedure/implant event; or a hospital transfer or hospital admission upon discharge from the facility. (G8907) Patient did not have preoperative order for IV antibiotic SSI prophylaxis. (G8918)  

## 2012-06-04 NOTE — Progress Notes (Signed)
Propofol per m smith crna. See scanned intra procedure report. ewm 

## 2012-06-05 ENCOUNTER — Telehealth: Payer: Self-pay | Admitting: *Deleted

## 2012-06-05 NOTE — Telephone Encounter (Signed)
  Follow up Call-  Call back number 06/04/2012  Post procedure Call Back phone  # [phone]  Permission to leave phone message Yes     Left message,no answer.

## 2012-06-05 NOTE — Telephone Encounter (Signed)
Patient was unable to come on 06/11/12, I have helped her reschedule to 06/12/12 8:30.  She verbalized understanding of instructions and to be NPO

## 2012-06-05 NOTE — Telephone Encounter (Signed)
Left message for patient to call back  

## 2012-06-11 ENCOUNTER — Ambulatory Visit (HOSPITAL_COMMUNITY): Payer: PRIVATE HEALTH INSURANCE

## 2012-06-12 ENCOUNTER — Ambulatory Visit (HOSPITAL_COMMUNITY)
Admission: RE | Admit: 2012-06-12 | Discharge: 2012-06-12 | Disposition: A | Discharge status: Home / Self Care | Payer: PRIVATE HEALTH INSURANCE | Source: Ambulatory Visit | Attending: Gastroenterology | Admitting: Gastroenterology

## 2012-06-12 DIAGNOSIS — R1013 Epigastric pain: Secondary | ICD-10-CM | POA: Insufficient documentation

## 2012-06-12 DIAGNOSIS — Z9089 Acquired absence of other organs: Secondary | ICD-10-CM | POA: Insufficient documentation

## 2012-06-12 DIAGNOSIS — R1110 Vomiting, unspecified: Secondary | ICD-10-CM | POA: Insufficient documentation

## 2012-10-05 ENCOUNTER — Other Ambulatory Visit: Payer: Self-pay

## 2013-01-31 ENCOUNTER — Other Ambulatory Visit: Payer: Self-pay | Admitting: Family Medicine

## 2013-01-31 LAB — COMPREHENSIVE METABOLIC PANEL
Albumin: 4.1 g/dL (ref 3.4–5.0)
BUN: 10 mg/dL (ref 7–18)
Chloride: 107 mmol/L (ref 98–107)
Co2: 29 mmol/L (ref 21–32)
Creatinine: 0.84 mg/dL (ref 0.60–1.30)
Glucose: 99 mg/dL (ref 65–99)
Osmolality: 282 (ref 275–301)
Potassium: 4.1 mmol/L (ref 3.5–5.1)
Total Protein: 6.9 g/dL (ref 6.4–8.2)

## 2013-01-31 LAB — TSH: Thyroid Stimulating Horm: 2.75 u[IU]/mL

## 2013-02-14 ENCOUNTER — Other Ambulatory Visit: Payer: Self-pay | Admitting: Urology

## 2013-02-28 ENCOUNTER — Encounter (HOSPITAL_BASED_OUTPATIENT_CLINIC_OR_DEPARTMENT_OTHER): Payer: Self-pay | Admitting: *Deleted

## 2013-02-28 NOTE — Progress Notes (Signed)
NPO AFTER MN. ARRIVES AT 0600. NEEDS HG AND EKG. WILL TAKE GABAPENTIN, NEXIUM, AND SYNTHROID AM OF SURG W/ SIP OF WATER.

## 2013-03-05 NOTE — H&P (Signed)
History of Present Illness   Kayla Curry had Interstim replaced in January 2013 for retention requiring clean intermittent catheterization. She had a low residual in May and was doing great. Three weeks ago, she said she started to cath again. She voided a small amount yesterday with a 500 cc residual but basically is cath dependent 3-4 times a day.  She did not fall. She said there was no other precipitating factors.   There is no other modifying factors or associated signs or symptoms. There is no other aggravating or relieving factors. The presentation is moderate to severe in severity and came on acutely.    Past Medical History Problems  1. History of  Asthma 493.90 2. History of  Cardiac Cath Indications: Cardiac Arrhythmia 3. History of  Esophageal Reflux 530.81 4. History of  Hypothyroidism 244.9 5. History of  Murmurs 785.2  Surgical History Problems  1. History of  Appendectomy 2. History of  Gallbladder Surgery 3. History of  Heart Surgery 4. History of  Hysterectomy V45.77 5. History of  Peripheral Nerve Neurostimulator Revision 6. History of  Peripheral Nerve Neurostimulator Revision Of Pulse Generator 7. History of  Tonsillectomy 8. History of  Tubal Ligation V25.2  Current Meds 1. Clobetasol Propionate 0.05 % External Cream; APPLY 1 INCH As needed; Therapy: 18Feb2013  to (Last Rx:18Feb2013)  Requested for: 19Feb2013 2. Flexeril 10 MG TABS; Therapy: (Recorded:16Jan2013) to 3. Gabapentin 600 MG Oral Tablet; Therapy: (Recorded:16Jan2013) to 4. Lunesta 3 MG Oral Tablet; Therapy: (Recorded:16Jan2013) to 5. Nitrofurantoin Macrocrystal 100 MG Oral Capsule; Take 1 capsule twice daily; Therapy:  05Feb2013 to (Last Rx:05Feb2013)  Requested for: 06Feb2013 6. Promethazine HCl 25 MG Oral Tablet; TAKE 1 TABLET Every 8 hours; Therapy: 06Feb2013 to  (Last Rx:06Feb2013)  Requested for: 06Feb2013 7. Promethazine HCl 25 MG Oral Tablet; TAKE 1 TABLET Every 8 hours; Therapy:  29Jan2013 to  (Last Rx:29Jan2013)  Requested for: 29Jan2013 8. Ranitidine HCl 300 MG Oral Tablet; Therapy: (Recorded:16Jan2013) to 9. Spironolactone TABS; Therapy: (Recorded:16Jan2013) to 10. Synthroid 75 MCG Oral Tablet; Therapy: (Recorded:16Jan2013) to  Allergies Medication  1. Levaquin TABS 2. Potassimin TABS 3. Advair Diskus MISC 4. Albuterol POWD 5. Ativan TABS 6. Atrovent HFA AERS 7. Bethanechol Chloride TABS 8. Compazine TABS 9. FLU 10. Symbicort AERO 11. Xopenex HFA AERO  Family History Problems  1. Family history of  Family Health Status Number Of Children 1 son1 daughter 2. Paternal history of  Hypertension V17.49  Social History Problems  1. Caffeine Use 4 drinks daily 2. Marital History - Currently Married 3. 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 History of  Alcohol Use 6. History of  Tobacco Use  Assessment Assessed  1. Incomplete Emptying Of Bladder 788.21 2. Urinary Stream Is Smaller 788.62  Plan   Discussion/Summary   Increased amplitude was required to get a motor response in the ____ because there was no vaginal or perirectal sensation.  I reviewed the APPROXIMATELY and lateral pelvic x-rays and I did not see any abnormalities or change in position that was obvious.  Impedance checks were all normal. There were no fast or slow circuits.   Kayla and I had a long talk. Of course, watchful waiting, staying on intermittent self-catheterization ____, she would like to do a lead revision. I do not think the battery needs to be changed. I would try to put it in the same position but I would use the motor and sensory responses that we normally do. She understands I cannot promise  that it would work, but hopefully it will. It likely has moved minimally, and that is a change that we cannot detect. Infection rates could be a little bit higher and this was discussed.  Pros, cons, success and failure rates of Interstim were discussed. We talked  about the test stimulation (office/operating room) and the second stage procedure. Risks were described but not limited to the risk of persistent, de novo, or worsening incontinence. Risks of pain, bleeding, infection, and neuropathy were discussed. Risk of malfunction, migration, and breakage were discussed. Trouble-shooting, battery life, and the need for explanation and reoperation were discussed. MRI issues were discussed. The patient understands that she might not reach her treatment goal and that she might be worse following surgery.  After a thorough review of the management options for the patient's condition the patient  elected to proceed with surgical therapy as noted above. We have discussed the potential benefits and risks of the procedure, side effects of the proposed treatment, the likelihood of the patient achieving the goals of the procedure, and any potential problems that might occur during the procedure or recuperation. Informed consent has been obtained.

## 2013-03-06 ENCOUNTER — Encounter (HOSPITAL_BASED_OUTPATIENT_CLINIC_OR_DEPARTMENT_OTHER)
Admission: RE | Disposition: A | Discharge status: Home / Self Care | Payer: Self-pay | Source: Ambulatory Visit | Attending: Urology

## 2013-03-06 ENCOUNTER — Encounter (HOSPITAL_BASED_OUTPATIENT_CLINIC_OR_DEPARTMENT_OTHER): Payer: Self-pay

## 2013-03-06 ENCOUNTER — Encounter (HOSPITAL_BASED_OUTPATIENT_CLINIC_OR_DEPARTMENT_OTHER): Payer: Self-pay | Admitting: Anesthesiology

## 2013-03-06 ENCOUNTER — Ambulatory Visit (HOSPITAL_BASED_OUTPATIENT_CLINIC_OR_DEPARTMENT_OTHER): Payer: PRIVATE HEALTH INSURANCE | Admitting: Anesthesiology

## 2013-03-06 ENCOUNTER — Ambulatory Visit (HOSPITAL_BASED_OUTPATIENT_CLINIC_OR_DEPARTMENT_OTHER)
Admission: RE | Admit: 2013-03-06 | Discharge: 2013-03-06 | Disposition: A | Discharge status: Home / Self Care | Payer: PRIVATE HEALTH INSURANCE | Source: Ambulatory Visit | Attending: Urology | Admitting: Urology

## 2013-03-06 ENCOUNTER — Ambulatory Visit (HOSPITAL_COMMUNITY): Payer: PRIVATE HEALTH INSURANCE

## 2013-03-06 DIAGNOSIS — K219 Gastro-esophageal reflux disease without esophagitis: Secondary | ICD-10-CM | POA: Insufficient documentation

## 2013-03-06 DIAGNOSIS — E039 Hypothyroidism, unspecified: Secondary | ICD-10-CM | POA: Insufficient documentation

## 2013-03-06 DIAGNOSIS — Y831 Surgical operation with implant of artificial internal device as the cause of abnormal reaction of the patient, or of later complication, without mention of misadventure at the time of the procedure: Secondary | ICD-10-CM | POA: Insufficient documentation

## 2013-03-06 DIAGNOSIS — T85695A Other mechanical complication of other nervous system device, implant or graft, initial encounter: Secondary | ICD-10-CM | POA: Insufficient documentation

## 2013-03-06 DIAGNOSIS — Z79899 Other long term (current) drug therapy: Secondary | ICD-10-CM | POA: Insufficient documentation

## 2013-03-06 HISTORY — PX: INTERSTIM IMPLANT REVISION: SHX5138

## 2013-03-06 HISTORY — DX: Other specified health status: Z78.9

## 2013-03-06 SURGERY — REVISION, SACRAL NERVE STIMULATOR, INTERSTIM
Anesthesia: Monitor Anesthesia Care | Site: Flank | Wound class: Clean

## 2013-03-06 MED ORDER — SODIUM CHLORIDE 0.9 % IV SOLN
INTRAVENOUS | Status: DC
  Filled 2013-03-06: qty 1000

## 2013-03-06 MED ORDER — PROPOFOL 10 MG/ML IV EMUL
INTRAVENOUS | Status: DC | PRN
  Administered 2013-03-06: 120 ug/kg/min via INTRAVENOUS

## 2013-03-06 MED ORDER — CEFAZOLIN SODIUM-DEXTROSE 2-3 GM-% IV SOLR
2.0000 g | INTRAVENOUS | Status: AC
  Administered 2013-03-06: 2 g via INTRAVENOUS
  Filled 2013-03-06: qty 50

## 2013-03-06 MED ORDER — LACTATED RINGERS IV SOLN
INTRAVENOUS | Status: DC
  Administered 2013-03-06 (×2): via INTRAVENOUS
  Filled 2013-03-06: qty 1000

## 2013-03-06 MED ORDER — KETOROLAC TROMETHAMINE 30 MG/ML IJ SOLN
15.0000 mg | Freq: Once | INTRAMUSCULAR | Status: DC | PRN
  Filled 2013-03-06: qty 1

## 2013-03-06 MED ORDER — PROMETHAZINE HCL 25 MG/ML IJ SOLN
6.2500 mg | INTRAMUSCULAR | Status: DC | PRN
  Filled 2013-03-06: qty 1

## 2013-03-06 MED ORDER — OXYCODONE-ACETAMINOPHEN 10-650 MG PO TABS: 1.0000 | ORAL_TABLET | Freq: Four times a day (QID) | ORAL | Status: DC | PRN

## 2013-03-06 MED ORDER — LIDOCAINE HCL (CARDIAC) 20 MG/ML IV SOLN
INTRAVENOUS | Status: DC | PRN
  Administered 2013-03-06: 50 mg via INTRAVENOUS

## 2013-03-06 MED ORDER — CEFAZOLIN SODIUM 1-5 GM-% IV SOLN
1.0000 g | INTRAVENOUS | Status: DC
  Filled 2013-03-06: qty 50

## 2013-03-06 MED ORDER — PROMETHAZINE HCL 25 MG/ML IJ SOLN
6.2500 mg | INTRAMUSCULAR | Status: AC | PRN
  Administered 2013-03-06 (×2): 6.25 mg via INTRAVENOUS
  Filled 2013-03-06: qty 1

## 2013-03-06 MED ORDER — DEXAMETHASONE SODIUM PHOSPHATE 4 MG/ML IJ SOLN
INTRAMUSCULAR | Status: DC | PRN
  Administered 2013-03-06: 4 mg via INTRAVENOUS

## 2013-03-06 MED ORDER — FENTANYL CITRATE 0.05 MG/ML IJ SOLN
25.0000 ug | INTRAMUSCULAR | Status: DC | PRN
  Administered 2013-03-06 (×2): 25 ug via INTRAVENOUS
  Filled 2013-03-06: qty 1

## 2013-03-06 MED ORDER — BUPIVACAINE-EPINEPHRINE 0.5% -1:200000 IJ SOLN
INTRAMUSCULAR | Status: DC | PRN
  Administered 2013-03-06: 13 mL

## 2013-03-06 MED ORDER — FENTANYL CITRATE 0.05 MG/ML IJ SOLN
INTRAMUSCULAR | Status: DC | PRN
  Administered 2013-03-06 (×4): 25 ug via INTRAVENOUS

## 2013-03-06 MED ORDER — LIDOCAINE-EPINEPHRINE (PF) 1 %-1:200000 IJ SOLN
INTRAMUSCULAR | Status: DC | PRN
  Administered 2013-03-06: 13 mL

## 2013-03-06 MED ORDER — MIDAZOLAM HCL 5 MG/5ML IJ SOLN
INTRAMUSCULAR | Status: DC | PRN
  Administered 2013-03-06 (×2): 1 mg via INTRAVENOUS

## 2013-03-06 MED ORDER — FENTANYL CITRATE 0.05 MG/ML IJ SOLN
25.0000 ug | INTRAMUSCULAR | Status: DC | PRN
  Filled 2013-03-06: qty 1

## 2013-03-06 SURGICAL SUPPLY — 48 items
BAG URINE LEG 500ML (DRAIN) ×2 IMPLANT
BANDAGE ADHESIVE 1X3 (GAUZE/BANDAGES/DRESSINGS) IMPLANT
BENZOIN TINCTURE PRP APPL 2/3 (GAUZE/BANDAGES/DRESSINGS) ×4 IMPLANT
BLADE HEX COATED 2.75 (ELECTRODE) IMPLANT
BLADE SURG 15 STRL LF DISP TIS (BLADE) ×1 IMPLANT
BLADE SURG 15 STRL SS (BLADE) ×1
CATH FOLEY 2WAY SLVR  5CC 16FR (CATHETERS) ×1
CATH FOLEY 2WAY SLVR 5CC 16FR (CATHETERS) ×1 IMPLANT
CLOTH BEACON ORANGE TIMEOUT ST (SAFETY) ×2 IMPLANT
COVER MAYO STAND STRL (DRAPES) ×2 IMPLANT
COVER PROBE W GEL 5X96 (DRAPES) ×2 IMPLANT
COVER TABLE BACK 60X90 (DRAPES) ×2 IMPLANT
DERMABOND ADVANCED (GAUZE/BANDAGES/DRESSINGS) ×1
DERMABOND ADVANCED .7 DNX12 (GAUZE/BANDAGES/DRESSINGS) ×1 IMPLANT
DRAPE C-ARM 42X72 X-RAY (DRAPES) ×2 IMPLANT
DRAPE INCISE 23X17 IOBAN STRL (DRAPES) ×1
DRAPE INCISE IOBAN 23X17 STRL (DRAPES) ×1 IMPLANT
DRAPE LAPAROSCOPIC ABDOMINAL (DRAPES) ×2 IMPLANT
DRESSING TELFA 8X3 (GAUZE/BANDAGES/DRESSINGS) ×2 IMPLANT
DRSG TEGADERM 2-3/8X2-3/4 SM (GAUZE/BANDAGES/DRESSINGS) ×2 IMPLANT
DRSG TEGADERM 4X4.75 (GAUZE/BANDAGES/DRESSINGS) ×2 IMPLANT
ELECT REM PT RETURN 9FT ADLT (ELECTROSURGICAL) ×2
ELECTRODE REM PT RTRN 9FT ADLT (ELECTROSURGICAL) ×1 IMPLANT
GAUZE SPONGE 4X4 12PLY STRL LF (GAUZE/BANDAGES/DRESSINGS) IMPLANT
GLOVE BIO SURGEON STRL SZ 6.5 (GLOVE) ×2 IMPLANT
GLOVE BIO SURGEON STRL SZ7.5 (GLOVE) ×2 IMPLANT
GLOVE ECLIPSE 6.0 STRL STRAW (GLOVE) ×2 IMPLANT
GLOVE INDICATOR 6.5 STRL GRN (GLOVE) ×2 IMPLANT
GOWN PREVENTION PLUS LG XLONG (DISPOSABLE) ×2 IMPLANT
GOWN STRL REIN XL XLG (GOWN DISPOSABLE) ×4 IMPLANT
INTRODUCER GUIDE DILATR SHEATH (SET/KITS/TRAYS/PACK) ×2 IMPLANT
LEAD (Lead) ×2 IMPLANT
NEEDLE FORAMEN 20GA 3.5  9CM (NEEDLE) IMPLANT
NEEDLE FORAMEN 20GA 5  12.5CM (NEEDLE) IMPLANT
NEEDLE HYPO 22GX1.5 SAFETY (NEEDLE) ×2 IMPLANT
PACK BASIN DAY SURGERY FS (CUSTOM PROCEDURE TRAY) ×2 IMPLANT
PENCIL BUTTON HOLSTER BLD 10FT (ELECTRODE) ×2 IMPLANT
PROGRAMMER ANTENNA EXT (UROLOGICAL SUPPLIES) ×2 IMPLANT
PROGRAMMER STIMUL 2.2X1.1X3.7 (UROLOGICAL SUPPLIES) ×2 IMPLANT
STAPLER VISISTAT 35W (STAPLE) IMPLANT
STIMULATOR INTERSTIM 2X1.7X.3 (Orthopedic Implant) ×2 IMPLANT
STRIP CLOSURE SKIN 1/2X4 (GAUZE/BANDAGES/DRESSINGS) IMPLANT
SUT VIC AB 3-0 SH 27 (SUTURE) ×2
SUT VIC AB 3-0 SH 27X BRD (SUTURE) ×2 IMPLANT
SUT VICRYL 4-0 PS2 18IN ABS (SUTURE) ×6 IMPLANT
SYR BULB IRRIGATION 50ML (SYRINGE) ×2 IMPLANT
SYR CONTROL 10ML LL (SYRINGE) ×2 IMPLANT
TOWEL OR 17X24 6PK STRL BLUE (TOWEL DISPOSABLE) ×4 IMPLANT

## 2013-03-06 NOTE — Transfer of Care (Signed)
Immediate Anesthesia Transfer of Care Note  Patient: Kayla Curry  Procedure(s) Performed: Procedure(s) (LRB): REVISION OF INTERSTIM (N/A)  Patient Location: PACU  Anesthesia Type:MAC  Level of Consciousness: awake, alert  and oriented  Airway & Oxygen Therapy: Patient Spontanous Breathing and Patient connected to face mask oxygen  Post-op Assessment: Report given to PACU RN and Post -op Vital signs reviewed and stable  Post vital signs: Reviewed and stable  Complications: No apparent anesthesia complications

## 2013-03-06 NOTE — Op Note (Signed)
Preoperative diagnosis: Malfunctioning InterStim Postoperative diagnosis: Malfunctioning InterStim Surgery: Replacement of InterStim stage I and stage II and impedance check Surgeon: Dr. Lorin Picket Jamael Hoffmann  The patient has the above diagnoses and consented above procedure. Preoperative antibiotics were given. Extra care was taken with th skin preparation and patient positioning  Appropriate anesthesia was given. I marked the right upper buttock incision and used a 15 blade after instilling 10 cc of a lidocaine epinephrine and Marcaine mixture. I open the pseudocapsule remove the IPG disconnecting it.  Using direct vision and fluoroscopy I could see the midline incision with some dimpling and marked where would open incision 2-1/2 cm. I dissected down with a hemostat and easily delivered the lead.  With sharp dissection good retraction I freed the lead almost to the bone table and under fluoroscopic guidance remove the lead in total with no retained tip  I then easily pass a 3.5 inch framing needle through the S3 foramina on the right. She had excellent bellows with almost no stimulus an excellent toe response. I was very happy with the angle of the framing needle and its medial aspect relative to the S3 foramina  Inner core of framing needle removed. Guide introduced appropriate depth. White trocar introduced to the appropriate depth. Flexible lead passed with good angle to the appropriate depth.  She had an excellent toe and bellows at position 0 one 2 and 3 with less than 1 in energy.  Under fluoroscopic guidance I removed the white trocar. AP and lateral x-rays were taken.  With the delivery device I passed the lead from medial to lateral. I connected to the IPG with the screwdriver. I urinated all incisions.  As a separate procedure impedance was checked in the impedance was normal in all 4 positions.  3-0 Vicryl subcutaneous and 4-0 subcuticular was used for both incisions. My usual  sterile dressing applied. Is very pleased the procedure

## 2013-03-06 NOTE — Anesthesia Preprocedure Evaluation (Signed)
Anesthesia Evaluation  Patient identified by MRN, date of birth, ID band Patient awake    Reviewed: Allergy & Precautions, H&P , NPO status , Patient's Chart, lab work & pertinent test results  Airway Mallampati: II TM Distance: >3 FB Neck ROM: Full    Dental no notable dental hx.    Pulmonary asthma ,  breath sounds clear to auscultation  Pulmonary exam normal       Cardiovascular negative cardio ROS  + dysrhythmias Supra Ventricular Tachycardia Rhythm:Regular Rate:Normal     Neuro/Psych negative neurological ROS  negative psych ROS   GI/Hepatic Neg liver ROS, GERD-  ,  Endo/Other  negative endocrine ROS  Renal/GU negative Renal ROS  negative genitourinary   Musculoskeletal negative musculoskeletal ROS (+)   Abdominal   Peds negative pediatric ROS (+)  Hematology negative hematology ROS (+)   Anesthesia Other Findings   Reproductive/Obstetrics negative OB ROS                           Anesthesia Physical Anesthesia Plan  ASA: II  Anesthesia Plan: MAC   Post-op Pain Management:    Induction: Intravenous  Airway Management Planned: Simple Face Mask  Additional Equipment:   Intra-op Plan:   Post-operative Plan:   Informed Consent: I have reviewed the patients History and Physical, chart, labs and discussed the procedure including the risks, benefits and alternatives for the proposed anesthesia with the patient or authorized representative who has indicated his/her understanding and acceptance.     Plan Discussed with: CRNA and Surgeon  Anesthesia Plan Comments:         Anesthesia Quick Evaluation

## 2013-03-06 NOTE — Interval H&P Note (Signed)
History and Physical Interval Note:  03/06/2013 7:19 AM  Kayla Curry  has presented today for surgery, with the diagnosis of MALFUNCTION INTERSTIM  The various methods of treatment have been discussed with the patient and family. After consideration of risks, benefits and other options for treatment, the patient has consented to  Procedure(s): REVISION OF INTERSTIM (N/A) as a surgical intervention .  The patient's history has been reviewed, patient examined, no change in status, stable for surgery.  I have reviewed the patient's chart and labs.  Questions were answered to the patient's satisfaction.     Giovonnie Trettel A

## 2013-03-06 NOTE — Anesthesia Postprocedure Evaluation (Signed)
  Anesthesia Post-op Note  Patient: Kayla Curry  Procedure(s) Performed: Procedure(s) (LRB): REVISION OF INTERSTIM (N/A)  Patient Location: PACU  Anesthesia Type: MAC  Level of Consciousness: awake and alert   Airway and Oxygen Therapy: Patient Spontanous Breathing  Post-op Pain: mild  Post-op Assessment: Post-op Vital signs reviewed, Patient's Cardiovascular Status Stable, Respiratory Function Stable, Patent Airway and No signs of Nausea or vomiting  Last Vitals:  Filed Vitals:   03/06/13 1000  BP: 132/66  Pulse: 71  Temp:   Resp: 19    Post-op Vital Signs: stable   Complications: No apparent anesthesia complications

## 2013-03-07 ENCOUNTER — Encounter (HOSPITAL_BASED_OUTPATIENT_CLINIC_OR_DEPARTMENT_OTHER): Payer: Self-pay | Admitting: Urology

## 2013-03-07 LAB — POCT HEMOGLOBIN-HEMACUE: Hemoglobin: 13.2 g/dL (ref 12.0–15.0)

## 2013-06-26 ENCOUNTER — Other Ambulatory Visit: Payer: Self-pay

## 2014-05-28 LAB — TSH: TSH: 6.56 u[IU]/mL — AB (ref <=5.90)

## 2014-05-28 LAB — BASIC METABOLIC PANEL
BUN: 21 mg/dL (ref 4–21)
Creatinine: 0.9 mg/dL (ref <=1.1)
GLUCOSE: 72 mg/dL
SODIUM: 139 mmol/L (ref 137–147)

## 2014-11-30 ENCOUNTER — Ambulatory Visit
Admit: 2014-11-30 | Disposition: A | Discharge status: Home / Self Care | Payer: Self-pay | Attending: General Practice | Admitting: General Practice

## 2014-12-10 ENCOUNTER — Institutional Professional Consult (permissible substitution): Payer: PRIVATE HEALTH INSURANCE | Admitting: Pulmonary Disease

## 2015-01-13 ENCOUNTER — Encounter: Payer: Self-pay | Admitting: General Practice

## 2015-01-13 ENCOUNTER — Other Ambulatory Visit: Payer: Self-pay

## 2015-01-13 ENCOUNTER — Emergency Department: Payer: PRIVATE HEALTH INSURANCE

## 2015-01-13 ENCOUNTER — Emergency Department
Admission: EM | Admit: 2015-01-13 | Discharge: 2015-01-13 | Disposition: A | Discharge status: Home / Self Care | Payer: PRIVATE HEALTH INSURANCE | Attending: Emergency Medicine | Admitting: Emergency Medicine

## 2015-01-13 DIAGNOSIS — A09 Infectious gastroenteritis and colitis, unspecified: Secondary | ICD-10-CM | POA: Insufficient documentation

## 2015-01-13 DIAGNOSIS — Z79899 Other long term (current) drug therapy: Secondary | ICD-10-CM | POA: Insufficient documentation

## 2015-01-13 DIAGNOSIS — K219 Gastro-esophageal reflux disease without esophagitis: Secondary | ICD-10-CM | POA: Insufficient documentation

## 2015-01-13 DIAGNOSIS — R1013 Epigastric pain: Secondary | ICD-10-CM | POA: Diagnosis present

## 2015-01-13 LAB — CBC WITH DIFFERENTIAL/PLATELET
Basophils Absolute: 0 10*3/uL (ref 0–0.1)
Basophils Relative: 0 %
Eosinophils Absolute: 0 10*3/uL (ref 0–0.7)
Eosinophils Relative: 0 %
HEMATOCRIT: 44.2 % (ref 35.0–47.0)
HEMOGLOBIN: 14.7 g/dL (ref 12.0–16.0)
Lymphocytes Relative: 9 %
Lymphs Abs: 1.1 10*3/uL (ref 1.0–3.6)
MCH: 29.7 pg (ref 26.0–34.0)
MCHC: 33.3 g/dL (ref 32.0–36.0)
MCV: 89.1 fL (ref 80.0–100.0)
MONOS PCT: 4 %
Monocytes Absolute: 0.4 10*3/uL (ref 0.2–0.9)
NEUTROS PCT: 87 %
Neutro Abs: 10.3 10*3/uL — ABNORMAL HIGH (ref 1.4–6.5)
PLATELETS: 282 10*3/uL (ref 150–440)
RBC: 4.96 MIL/uL (ref 3.80–5.20)
RDW: 12.8 % (ref 11.5–14.5)
WBC: 11.9 10*3/uL — ABNORMAL HIGH (ref 3.6–11.0)

## 2015-01-13 LAB — COMPREHENSIVE METABOLIC PANEL
ALT: 56 U/L — ABNORMAL HIGH (ref 14–54)
AST: 52 U/L — ABNORMAL HIGH (ref 15–41)
Albumin: 5.1 g/dL — ABNORMAL HIGH (ref 3.5–5.0)
Alkaline Phosphatase: 68 U/L (ref 38–126)
Anion gap: 11 (ref 5–15)
BUN: 15 mg/dL (ref 6–20)
CO2: 32 mmol/L (ref 22–32)
Calcium: 9.6 mg/dL (ref 8.9–10.3)
Chloride: 95 mmol/L — ABNORMAL LOW (ref 101–111)
Creatinine, Ser: 1.06 mg/dL — ABNORMAL HIGH (ref 0.44–1.00)
GFR calc Af Amer: >60 mL/min (ref >60)
GFR calc non Af Amer: >60 mL/min (ref >60)
Glucose, Bld: 116 mg/dL — ABNORMAL HIGH (ref 65–99)
Potassium: 2.9 mmol/L — CL (ref 3.5–5.1)
Sodium: 138 mmol/L (ref 135–145)
Total Bilirubin: 0.4 mg/dL (ref 0.3–1.2)
Total Protein: 8.3 g/dL — ABNORMAL HIGH (ref 6.5–8.1)

## 2015-01-13 LAB — TROPONIN I: Troponin I: <0.03 ng/mL (ref <0.031)

## 2015-01-13 LAB — LIPASE, BLOOD: LIPASE: 36 U/L (ref 22–51)

## 2015-01-13 MED ORDER — MORPHINE SULFATE 4 MG/ML IJ SOLN
4.0000 mg | Freq: Once | INTRAMUSCULAR | Status: AC
  Administered 2015-01-13: 4 mg via INTRAVENOUS

## 2015-01-13 MED ORDER — PROMETHAZINE HCL 25 MG PO TABS: 25.0000 mg | ORAL_TABLET | Freq: Four times a day (QID) | ORAL | Status: DC | PRN

## 2015-01-13 MED ORDER — PROMETHAZINE HCL 25 MG/ML IJ SOLN
25.0000 mg | Freq: Once | INTRAMUSCULAR | Status: AC
  Administered 2015-01-13: 25 mg via INTRAVENOUS

## 2015-01-13 MED ORDER — MORPHINE SULFATE 4 MG/ML IJ SOLN
INTRAMUSCULAR | Status: AC
  Administered 2015-01-13: 4 mg via INTRAVENOUS
  Filled 2015-01-13: qty 1

## 2015-01-13 MED ORDER — METRONIDAZOLE 250 MG PO TABS: 250.0000 mg | ORAL_TABLET | Freq: Three times a day (TID) | ORAL | Status: AC

## 2015-01-13 MED ORDER — PROMETHAZINE HCL 25 MG/ML IJ SOLN
INTRAMUSCULAR | Status: AC
  Administered 2015-01-13: 25 mg via INTRAVENOUS
  Filled 2015-01-13: qty 1

## 2015-01-13 MED ORDER — GI COCKTAIL ~~LOC~~
30.0000 mL | Freq: Once | ORAL | Status: AC
  Administered 2015-01-13: 30 mL via ORAL

## 2015-01-13 MED ORDER — SODIUM CHLORIDE 0.9 % IV SOLN
1000.0000 mL | Freq: Once | INTRAVENOUS | Status: AC
Start: 2015-01-13 — End: 2015-01-13
  Administered 2015-01-13: 1000 mL via INTRAVENOUS

## 2015-01-13 MED ORDER — IOHEXOL 240 MG/ML SOLN: 25.0000 mL | INTRAMUSCULAR | Status: AC

## 2015-01-13 MED ORDER — GI COCKTAIL ~~LOC~~
ORAL | Status: AC
  Administered 2015-01-13: 30 mL via ORAL
  Filled 2015-01-13: qty 30

## 2015-01-13 MED ORDER — IOHEXOL 350 MG/ML SOLN
80.0000 mL | Freq: Once | INTRAVENOUS | Status: AC | PRN
  Administered 2015-01-13: 80 mL via INTRAVENOUS

## 2015-01-13 NOTE — Discharge Instructions (Signed)

## 2015-01-13 NOTE — ED Notes (Signed)
Pt. Arrived to ed from home with reports of sudden onset of epigastric pain, upper abdomen, that started around 9am. PT verbalized nausea but denies vomiting at this time. Pt states "i have cold sweats".  Alert and oriented x 3.

## 2015-01-13 NOTE — ED Provider Notes (Signed)
[facility] Emergency Department Provider Note  ____________________________________________  Time seen: 2:50 PM  I have reviewed the triage vital signs and the nursing notes.   HISTORY  Chief Complaint Abdominal Pain; Nausea; and Chills      HPI Kayla Curry is a 44 y.o. female who presents with onset of severe epigastric pain at approximately 9:30 AM this morning.She reports she has had this pain in the past but typically resolves after taking some Pepto-Bismol. She has a history of cholecystectomy. She reports mild nausea but no vomiting. Normal bowel movements. No fevers chills. She reports she has had an endoscopy before which showed "reflux"     Past Medical History  Diagnosis Date  . Urinary retention SELF-CATH TID AND PRN  . Injury of pelvis or lower limb peripheral nerve, late effect HX PELVIC FX-- RESIDUAL URINARY RETENTION  . Asthma   . S/P ablation of ventricular arrhythmia HX SVT  [facility]--  LAST VISIT 2012 -- PT RELEASED FROM CARE ON PRN BASIS  . GERD (gastroesophageal reflux disease)   . Pelvic pain   . Recurrent UTI   . History of pelvic fracture   . History of supraventricular tachycardia     PER PT ON 02-28-2013 NO ISSUES SINCE ABLATION IN 2005  . Hypothyroidism   . Murmur   . Mass of adrenal gland   . Self-catheterizes urinary bladder     TID AND PRN  . Chronic pain     There are no active problems to display for this patient.   Past Surgical History  Procedure Laterality Date  . Interstim implant placement  [facility]  . Cardiac electrophysiology study and ablation  2005- AT [facility]    SVT--  PT STATES NO ISSUES SINCE   . Interstim implant placement  10/05/2011    Procedure: INTERSTIM IMPLANT FIRST STAGE;  Surgeon: [name], MD;  Location: [facility];  Service: Urology;  Laterality: N/A;  . Interstim implant placement  10/05/2011    Procedure:  INTERSTIM IMPLANT SECOND STAGE;  Surgeon: [name], MD;  Location: [facility];  Service: Urology;  Laterality: N/A;  . Laparoscopic cholecystectomy  2001  . Appendectomy  1994    EXPL. LAP.  Marland Kitchen Vaginal hysterectomy  2003  . Tonsillectomy  1998  . Interstim implant revision N/A 03/06/2013    Procedure: REVISION OF Barrie Lyme;  Surgeon: [name], MD;  Location: [facility];  Service: Urology;  Laterality: N/A;    Current Outpatient Rx  Name  Route  Sig  Dispense  Refill  . cyclobenzaprine (FLEXERIL) 10 MG tablet   Oral   Take 10 mg by mouth 2 (two) times daily as needed.         Marland Kitchen esomeprazole (NEXIUM) 40 MG capsule   Oral   Take 1 capsule (40 mg total) by mouth daily before breakfast.   30 capsule   11   . gabapentin (NEURONTIN) 600 MG tablet   Oral   Take 600 mg by mouth 3 (three) times daily.         Marland Kitchen levothyroxine (SYNTHROID, LEVOTHROID) 75 MCG tablet   Oral   Take 75 mcg by mouth every morning.         . Metaproterenol Sulfate (ALUPENT IN)   Inhalation   Inhale into the lungs as needed.         . metoCLOPramide (REGLAN)  10 MG tablet   Oral   Take 10 mg by mouth 3 (three) times daily.         . nitrofurantoin (MACRODANTIN) 100 MG capsule   Oral   Take 100 mg by mouth 2 (two) times daily.         Marland Kitchen oxyCODONE-acetaminophen (PERCOCET) 10-650 MG per tablet   Oral   Take 1 tablet by mouth every 6 (six) hours as needed for pain.   30 tablet   0   . oxyCODONE-acetaminophen (PERCOCET) 5-325 MG per tablet   Oral   Take 1 tablet by mouth every 4 (four) hours as needed.         Marland Kitchen spironolactone (ALDACTONE) 100 MG tablet   Oral   Take 100 mg by mouth 2 (two) times daily.           Allergies Advair diskus; Atrovent; Bethanechol; Budesonide-formoterol fumarate; Influenza virus vaccine split; Xopenex; Compazine; Fluoride preparations; Lorazepam; Levofloxacin; Potassium-containing compounds; and  Zofran  Family History  Problem Relation Age of Onset  . Colon cancer Neg Hx     Social History History  Substance Use Topics  . Smoking status: Never Smoker   . Smokeless tobacco: Never Used  . Alcohol Use: No    Review of Systems  Constitutional: Negative for fever. Eyes: Negative for visual changes. ENT: Negative for sore throat Cardiovascular: Negative for chest pain. Respiratory: Negative for shortness of breath. Gastrointestinal: Positive for abdominal pain, nausea Genitourinary: Negative for dysuria. Musculoskeletal: Negative for back pain. Skin: Negative for rash. Neurological: Negative for headaches or focal weakness   10-point ROS otherwise negative.  ____________________________________________   PHYSICAL EXAM:  VITAL SIGNS: ED Triage Vitals  Enc Vitals Group     BP 01/13/15 1314 125/75 mmHg     Pulse Rate 01/13/15 1314 101     Resp 01/13/15 1314 19     Temp 01/13/15 1314 97.8 F (36.6 C)     Temp Source 01/13/15 1314 Oral     SpO2 01/13/15 1314 100 %     Weight 01/13/15 1314 120 lb (54.432 kg)     Height 01/13/15 1314 5\' 2"  (1.575 m)     Head Cir --      Peak Flow --      Pain Score 01/13/15 1315 10     Pain Loc --      Pain Edu? --      Excl. in [place]? --      Constitutional: Alert and oriented. Appears uncomfortable Eyes: Conjunctivae are normal. PERRL. ENT   Head: Normocephalic and atraumatic.   Nose: No rhinnorhea.   Mouth/Throat: Mucous membranes are moist. Cardiovascular: Normal rate, regular rhythm. Normal and symmetric distal pulses are present in all extremities. No murmurs, rubs, or gallops. Respiratory: Normal respiratory effort without tachypnea nor retractions. Breath sounds are clear and equal bilaterally.  Gastrointestinal: Tender in the right upper quadrant and epigastrium. No distention. There is no CVA tenderness. Genitourinary: deferred Musculoskeletal: Nontender with normal range of motion in all extremities. No  lower extremity tenderness nor edema. Neurologic:  Normal speech and language. No gross focal neurologic deficits are appreciated. Skin:  Skin is warm, dry and intact. No rash noted. Psychiatric: Mood and affect are normal. Patient exhibits appropriate insight and judgment.  ____________________________________________    LABS (pertinent positives/negatives)  Labs Reviewed  CBC WITH DIFFERENTIAL/PLATELET - Abnormal; Notable for the following:    WBC 11.9 (*)    Neutro Abs 10.3 (*)    All  other components within normal limits  COMPREHENSIVE METABOLIC PANEL - Abnormal; Notable for the following:    Potassium 2.9 (*)    Chloride 95 (*)    Glucose, Bld 116 (*)    Creatinine, Ser 1.06 (*)    Total Protein 8.3 (*)    Albumin 5.1 (*)    AST 52 (*)    ALT 56 (*)    All other components within normal limits  LIPASE, BLOOD  TROPONIN I  URINALYSIS COMPLETEWITH MICROSCOPIC ([facility])     ____________________________________________   EKG  ED ECG REPORT I, Lavonia Drafts, the attending physician, personally viewed and interpreted this ECG.   Date: 01/13/2015  EKG Time: 1:22 PM  Rate: 96  Rhythm: normal sinus rhythm, nonspecific ST and T waves changes  Axis: Normal axis  Intervals:none  ST&T Change: Nonspecific changes   ____________________________________________    RADIOLOGY  CT abdomen pelvis consistent with enteritis  ____________________________________________   PROCEDURES  Procedure(s) performed:  18-gauge peripheral IV placed in the right antecubital fossa under ultrasound guidance by me. Patient tolerated well.  Critical Care performed: none  ____________________________________________   INITIAL IMPRESSION / ASSESSMENT AND PLAN / ED COURSE  Pertinent labs & imaging results that were available during my care of the patient were reviewed by me and considered in my medical decision making (see chart for details).  Patient obviously uncomfortable, pain  is limited to the epigastrium. We will try GI cocktail and that relieve her pain we will try IV analgesia.  ____________________________________________ ----------------------------------------- 9:41 PM on 01/13/2015 -----------------------------------------  Patient with enteritis on CT abdomen pelvis. We will treat her with by mouth antibiotics pain medication and antiemetics. She will follow-up with her PCP. Patient agrees with plan. Return precautions given.  FINAL CLINICAL IMPRESSION(S) / ED DIAGNOSES  Final diagnoses:  Infectious enteritis, unspecified infectious agent     Lavonia Drafts, MD 01/13/15 2142

## 2015-01-29 ENCOUNTER — Other Ambulatory Visit: Payer: Self-pay | Admitting: Family Medicine

## 2015-02-04 ENCOUNTER — Other Ambulatory Visit: Payer: Self-pay | Admitting: Family Medicine

## 2015-02-04 NOTE — Telephone Encounter (Signed)
Pt contacted office for refill request on the following medications:oxyCODONE-acetaminophen (PERCOCET) 10-650 MG.  CB#[phone]/MJ

## 2015-02-05 MED ORDER — OXYCODONE-ACETAMINOPHEN 10-650 MG PO TABS: 1.0000 | ORAL_TABLET | ORAL | Status: DC | PRN

## 2015-02-08 ENCOUNTER — Other Ambulatory Visit: Payer: Self-pay | Admitting: Family Medicine

## 2015-02-10 ENCOUNTER — Other Ambulatory Visit: Payer: Self-pay | Admitting: Family Medicine

## 2015-02-10 DIAGNOSIS — R102 Pelvic and perineal pain: Secondary | ICD-10-CM

## 2015-02-11 NOTE — Telephone Encounter (Signed)
Your patient 

## 2015-03-03 ENCOUNTER — Other Ambulatory Visit: Payer: Self-pay | Admitting: Family Medicine

## 2015-03-03 NOTE — Telephone Encounter (Signed)
Pt contacted office for refill request on the following medications: oxyCODONE-acetaminophen (PERCOCET) 7.5-325 MG  Pt stated she will run out Monday 03/08/15. Thanks TNP

## 2015-03-04 MED ORDER — OXYCODONE-ACETAMINOPHEN 7.5-325 MG PO TABS: ORAL_TABLET | ORAL | Status: DC

## 2015-03-04 NOTE — Telephone Encounter (Signed)
This medication does not appear to have been filled by provider before. Please advise?

## 2015-03-30 ENCOUNTER — Other Ambulatory Visit: Payer: Self-pay | Admitting: Family Medicine

## 2015-03-30 NOTE — Telephone Encounter (Signed)
Pt contacted office for refill request on the following medications: oxyCODONE-acetaminophen (PERCOCET) 7.5-325 MG per tablet. Pt stated she will be out of medication Saturday 04/03/15. Rx was last written on 03/04/15 and last OV was on 05/29/15. Thanks TNP

## 2015-03-31 MED ORDER — OXYCODONE-ACETAMINOPHEN 7.5-325 MG PO TABS: ORAL_TABLET | ORAL | Status: DC

## 2015-04-28 ENCOUNTER — Other Ambulatory Visit: Payer: Self-pay | Admitting: Family Medicine

## 2015-04-28 MED ORDER — OXYCODONE-ACETAMINOPHEN 7.5-325 MG PO TABS: ORAL_TABLET | ORAL | Status: DC

## 2015-04-28 NOTE — Telephone Encounter (Signed)
Pt contacted office for refill request on the following medications: °oxyCODONE-acetaminophen (PERCOCET) 7.5-325 MG per tablet. Thanks TNP °

## 2015-05-19 ENCOUNTER — Other Ambulatory Visit: Payer: Self-pay | Admitting: Family Medicine

## 2015-05-19 NOTE — Telephone Encounter (Signed)
Pt needs refill on oxyCODONE-acetaminophen (PERCOCET) 7.5-325 MG per tablet 04/28/15 -- [name], MD every four to six hours as needed  Call back is [phone]  Thanks Con Memos

## 2015-05-20 MED ORDER — OXYCODONE-ACETAMINOPHEN 7.5-325 MG PO TABS: ORAL_TABLET | ORAL | Status: DC

## 2015-05-23 ENCOUNTER — Other Ambulatory Visit: Payer: Self-pay | Admitting: Family Medicine

## 2015-05-31 ENCOUNTER — Other Ambulatory Visit: Payer: Self-pay | Admitting: Family Medicine

## 2015-06-06 ENCOUNTER — Other Ambulatory Visit: Payer: Self-pay | Admitting: Family Medicine

## 2015-06-10 DIAGNOSIS — R609 Edema, unspecified: Secondary | ICD-10-CM | POA: Insufficient documentation

## 2015-06-10 DIAGNOSIS — N959 Unspecified menopausal and perimenopausal disorder: Secondary | ICD-10-CM | POA: Insufficient documentation

## 2015-06-10 DIAGNOSIS — M549 Dorsalgia, unspecified: Secondary | ICD-10-CM | POA: Insufficient documentation

## 2015-06-10 DIAGNOSIS — K219 Gastro-esophageal reflux disease without esophagitis: Secondary | ICD-10-CM | POA: Insufficient documentation

## 2015-06-10 DIAGNOSIS — G4700 Insomnia, unspecified: Secondary | ICD-10-CM | POA: Insufficient documentation

## 2015-06-10 DIAGNOSIS — N319 Neuromuscular dysfunction of bladder, unspecified: Secondary | ICD-10-CM | POA: Insufficient documentation

## 2015-06-11 ENCOUNTER — Ambulatory Visit (INDEPENDENT_AMBULATORY_CARE_PROVIDER_SITE_OTHER): Payer: PRIVATE HEALTH INSURANCE | Admitting: Family Medicine

## 2015-06-11 ENCOUNTER — Encounter: Payer: Self-pay | Admitting: Family Medicine

## 2015-06-11 VITALS — BP 120/70 | HR 83 | Temp 98.0°F | Resp 16 | Ht 62.0 in | Wt 110.0 lb

## 2015-06-11 DIAGNOSIS — E876 Hypokalemia: Secondary | ICD-10-CM | POA: Diagnosis not present

## 2015-06-11 DIAGNOSIS — K219 Gastro-esophageal reflux disease without esophagitis: Secondary | ICD-10-CM | POA: Diagnosis not present

## 2015-06-11 DIAGNOSIS — E039 Hypothyroidism, unspecified: Secondary | ICD-10-CM | POA: Diagnosis not present

## 2015-06-11 DIAGNOSIS — J45909 Unspecified asthma, uncomplicated: Secondary | ICD-10-CM | POA: Diagnosis not present

## 2015-06-11 MED ORDER — OXYCODONE-ACETAMINOPHEN 7.5-325 MG PO TABS: ORAL_TABLET | ORAL | Status: DC

## 2015-06-11 MED ORDER — TIZANIDINE HCL 4 MG PO TABS: 4.0000 mg | ORAL_TABLET | Freq: Two times a day (BID) | ORAL | Status: DC

## 2015-06-11 NOTE — Progress Notes (Signed)
Patient: Kayla Curry Female    DOB: 1971/03/03   44 y.o.   MRN: [id number] Visit Date: 06/11/2015  Today's Provider: Lelon Huh, MD   Chief Complaint  Patient presents with  . Hypothyroidism    follow up  . Gastroesophageal Reflux    follow up  . Pain    follow up   Subjective:    HPI   GERD, Follow up:  The patient was last seen for GERD 1 months ago. Changes made since that visit include none.  She reports good compliance with treatment. She is not having side effects. .  She IS experiencing stomach pain. She is NOT experiencing abdominal bloating, chest pain, cough or dysphagia  Patient has an appointment to see GI specialist 06/22/2015.  ------------------------------------------------------------------------  Hypothyroidism Follow up: Last office visit was 1 year ago and no changes were made. Patient reports good compliance with treatment and good tolerance.   Follow up Chronic pain: Last office visit was 1 year ago and no changes were made. Patient currently takes 4-5 Percocet  Tablets daily. Patient reports current treatment has provided good symptom control. However she states that she took tizanidine in the past which worked better than cyclobenzaprine, and she would like to change medication.   Vitamin D Deficiency: Last visit was 1 year ago and no changes were made. Patient is not currently taking any OTC Vitamin supplements.   Hypokalemia She has long history of idiopathic hypokalemia which had been normalized on spironolactone. She was in the ER for abdominal pain, nausea and vomiting in May and potassium was found to be 2.9. Ct at that time found stable right adrenal nodule which has been worked up previously.      Allergies  Allergen Reactions  . Advair Diskus [Fluticasone-Salmeterol] Shortness Of Breath  . Albuterol Sulfate Shortness Of Breath  . Atrovent Shortness Of Breath  . Bethanechol Shortness Of Breath and Other (See Comments)     RESPIRATORY DISTRESS  . Budesonide-Formoterol Fumarate Shortness Of Breath  . Influenza Virus Vaccine Split Anaphylaxis  . Xopenex [Levalbuterol] Shortness Of Breath  . Compazine Other (See Comments)    DYSTONIC  . Fluoride Preparations   . Lorazepam Other (See Comments)    "HAS OPPOSITE EFFECT"  . Levofloxacin Rash  . Potassium-Containing Compounds Nausea And Vomiting    Just the po potassium  No problem with iv  . Zofran [Ondansetron Hcl] Rash   Previous Medications   BACLOFEN (LIORESAL) 20 MG TABLET    TAKE 1 TABLET BY MOUTH 3 TIMES A DAY AS NEEDED   CYCLOBENZAPRINE (FLEXERIL) 10 MG TABLET    TAKE 1 TABLET BY MOUTH TWICE A DAY   ESOMEPRAZOLE (NEXIUM) 40 MG CAPSULE    Take 1 capsule (40 mg total) by mouth daily before breakfast.   GABAPENTIN (NEURONTIN) 600 MG TABLET    Take 600 mg by mouth 3 (three) times daily.   METAPROTERENOL SULFATE (ALUPENT IN)    Inhale into the lungs as needed.   METOCLOPRAMIDE (REGLAN) 10 MG TABLET    Take 1 tablet (10 mg total) by mouth 3 (three) times daily before meals.   OXYCODONE-ACETAMINOPHEN (PERCOCET) 7.5-325 MG TABLET    every four to six hours as needed   PROMETHAZINE (PHENERGAN) 25 MG TABLET    Take 1 tablet (25 mg total) by mouth every 6 (six) hours as needed for nausea or vomiting.   SPIRONOLACTONE (ALDACTONE) 100 MG TABLET    Take 100 mg by  mouth 2 (two) times daily.   SYNTHROID 75 MCG TABLET    TAKE 1 TABLET BY MOUTH DAILY    Review of Systems  Constitutional: Negative for fever, chills, appetite change and fatigue.  Respiratory: Negative for chest tightness and shortness of breath.   Cardiovascular: Negative for chest pain and palpitations.  Gastrointestinal: Positive for abdominal pain. Negative for nausea and vomiting.  Neurological: Negative for dizziness and weakness.    Social History  Substance Use Topics  . Smoking status: Never Smoker   . Smokeless tobacco: Never Used  . Alcohol Use: No   Objective:   BP 120/70 mmHg   Pulse 83  Temp(Src) 98 F (36.7 C) (Oral)  Resp 16  Ht 5\' 2"  (1.575 m)  Wt 110 lb (49.896 kg)  BMI 20.11 kg/m2  SpO2 100%  Physical Exam   General Appearance:    Alert, cooperative, no distress  Eyes:    PERRL, conjunctiva/corneas clear, EOM's intact       Lungs:     Clear to auscultation bilaterally, respirations unlabored  Heart:    Regular rate and rhythm  Neurologic:   Awake, alert, oriented x 3. No apparent focal neurological           defect.          Assessment & Plan:     1. Asthma, extrinsic, unspecified asthma severity, uncomplicated Well controlled, rarely requiring inhaled beta-agonists  2. Gastroesophageal reflux disease without esophagitis Asymptomatic on OTC Nexium and QAC Reglan.   3. Hypothyroidism, unspecified hypothyroidism type  - TSH  4. Hypokalemia Continue spironolactone.  - Renal function panel       Lelon Huh, MD  [facility]

## 2015-06-12 LAB — RENAL FUNCTION PANEL
Albumin: 4.8 g/dL (ref 3.5–5.5)
BUN/Creatinine Ratio: 10 (ref 9–23)
BUN: 7 mg/dL (ref 6–24)
CO2: 26 mmol/L (ref 18–29)
CREATININE: 0.68 mg/dL (ref 0.57–1.00)
Calcium: 9.6 mg/dL (ref 8.7–10.2)
Chloride: 98 mmol/L (ref 97–106)
GFR calc Af Amer: 124 mL/min/{1.73_m2} (ref >59)
GFR calc non Af Amer: 107 mL/min/{1.73_m2} (ref >59)
Glucose: 82 mg/dL (ref 65–99)
PHOSPHORUS: 3.7 mg/dL (ref 2.5–4.5)
Potassium: 2.9 mmol/L — ABNORMAL LOW (ref 3.5–5.2)
SODIUM: 143 mmol/L (ref 136–144)

## 2015-06-12 LAB — TSH: TSH: 1.69 u[IU]/mL (ref 0.450–4.500)

## 2015-06-14 ENCOUNTER — Encounter: Payer: Self-pay | Admitting: Family Medicine

## 2015-06-14 ENCOUNTER — Telehealth: Payer: Self-pay | Admitting: *Deleted

## 2015-06-14 NOTE — Telephone Encounter (Signed)
Patient was notified of results. Patient stated that she has an allergic reaction to potassium. Patient stated that the only way for her to take potassium was through IV, however she does not want to do that at this time. Patient also said that she takes spirolactone to help with potassium and wanted to know if that could be increased instead?

## 2015-06-14 NOTE — Telephone Encounter (Signed)
She is already taking high dose of spironolactone. Just try to get eat more potassium rich foods.

## 2015-06-14 NOTE — Telephone Encounter (Signed)
-----   Message from [name], MD sent at 06/12/2015  8:14 AM EDT ----- Normal thyroid functions. Potassium level is low at 2.9. Recommend she start klor-con 10 meQ one tablet daily and recheck potassium levels in a month. Continue other medications unchanged.

## 2015-06-15 NOTE — Telephone Encounter (Signed)
Patient notified. Patient expressed understanding.  

## 2015-06-21 ENCOUNTER — Other Ambulatory Visit: Payer: Self-pay

## 2015-06-22 ENCOUNTER — Ambulatory Visit (INDEPENDENT_AMBULATORY_CARE_PROVIDER_SITE_OTHER): Payer: PRIVATE HEALTH INSURANCE | Admitting: Gastroenterology

## 2015-06-22 ENCOUNTER — Encounter: Payer: Self-pay | Admitting: Gastroenterology

## 2015-06-22 VITALS — BP 138/79 | HR 94 | Temp 97.7°F | Ht 62.0 in | Wt 113.6 lb

## 2015-06-22 DIAGNOSIS — R1013 Epigastric pain: Secondary | ICD-10-CM | POA: Diagnosis not present

## 2015-06-22 DIAGNOSIS — G8929 Other chronic pain: Secondary | ICD-10-CM | POA: Diagnosis not present

## 2015-06-22 NOTE — Progress Notes (Signed)
Gastroenterology Consultation  Referring Provider:     [name], MD Primary Care Physician:  Lelon Huh, MD Primary Gastroenterologist:  Dr. Allen Norris     Reason for Consultation:     Abdominal pain        HPI:   Kayla Curry is a 44 y.o. y/o female referred for consultation & management of abdominal pain by Dr. Lelon Huh, MD.  Patient comes today with a report of episodes every 1-2 months when she has severe abdominal pain in the epigastric area that goes down to the rest of her abdomen. The patient states it is so painful she is unable to even touch her abdomen. The patient was in the emergency room in the past for this. The patient states that she has been helped in the past with Pepto-Bismol but states it takes a few hours to relieve the symptoms. She also states that she was given pain medication emergency room which did not help her much. The patient reports the pain to be a constant sharp pain but not a colicky pain. She denies any unexplained weight loss. There is no association with eating or drinking when she has the pain. She also denies any change in bowel habits. There is no report of any vomiting although she states when the pain gets very severe she can have some nausea. She denies any dysphagia. There is also no association with any fatty foods or greasy foods.  Past Medical History  Diagnosis Date  . Urinary retention     Self cath TID and prn  . Injury of pelvis or lower limb peripheral nerve, late effect     HX PELVIC FX-- RESIDUAL URINARY RETENTION  . S/P ablation of ventricular arrhythmia HX SVT  [place]--  LAST VISIT 2012 -- PT RELEASED FROM CARE ON PRN BASIS  . Pelvic pain   . History of pelvic fracture   . History of supraventricular tachycardia     PER PT ON 02-28-2013 NO ISSUES SINCE ABLATION IN 2005  . Mass of adrenal gland [facility])     Past Surgical History  Procedure Laterality Date  . Interstim implant  placement  2009[facility]  . Cardiac electrophysiology study and ablation  2005- AT [facility]    SVT--  PT STATES NO ISSUES SINCE   . Interstim implant placement  10/05/2011    Procedure: INTERSTIM IMPLANT FIRST STAGE;  Surgeon: [name], MD;  Location: [facility];  Service: Urology;  Laterality: N/A;  . Interstim implant placement  10/05/2011    Procedure: INTERSTIM IMPLANT SECOND STAGE;  Surgeon: [name], MD;  Location[facility];  Service: Urology;  Laterality: N/A;  . Laparoscopic cholecystectomy  2001  . Appendectomy  1994    EXPL. LAP.  Marland Kitchen Vaginal hysterectomy  2003  . Tonsillectomy  1998  . Interstim implant revision N/A 03/06/2013    Procedure: REVISION OF Barrie Lyme;  Surgeon: [name], MD;  Location: [facility];  Service: Urology;  Laterality: N/A;    Prior to Admission medications   Medication Sig Start Date End Date Taking? Authorizing Provider  baclofen (LIORESAL) 20 MG tablet TAKE 1 TABLET BY MOUTH 3 TIMES A DAY AS NEEDED 06/06/15  Yes [name], MD  esomeprazole (NEXIUM) 40 MG capsule Take 1 capsule (40 mg total) by mouth daily before breakfast. 06/04/12  Yes Ladene Artist, MD  gabapentin (NEURONTIN) 600 MG tablet Take 600 mg by mouth 3 (three) times daily.   Yes Historical Provider, MD  Metaproterenol Sulfate (ALUPENT IN) Inhale into the lungs as needed.   Yes Historical Provider, MD  metoCLOPramide (REGLAN) 10 MG tablet Take 1 tablet (10 mg total) by mouth 3 (three) times daily before meals. 01/29/15  Yes [name], MD  oxyCODONE-acetaminophen (PERCOCET) 7.5-325 MG tablet every four to six hours as needed 06/11/15  Yes [name], MD  spironolactone (ALDACTONE) 100 MG tablet Take 100 mg by mouth 2 (two) times daily.   Yes Historical Provider, MD  SYNTHROID 75 MCG tablet TAKE 1 TABLET BY MOUTH DAILY 05/31/15  Yes [name], MD  tiZANidine (ZANAFLEX) 4 MG tablet Take 1  tablet (4 mg total) by mouth 2 (two) times daily. 06/11/15  Yes [name], MD  cyclobenzaprine (FLEXERIL) 10 MG tablet Take 10 mg by mouth 2 (two) times daily. 05/23/15   Historical Provider, MD  promethazine (PHENERGAN) 25 MG tablet Take 1 tablet (25 mg total) by mouth every 6 (six) hours as needed for nausea or vomiting. Patient not taking: Reported on 06/11/2015 01/13/15   Lavonia Drafts, MD    Family History  Problem Relation Age of Onset  . Colon cancer Neg Hx   . Hypertension Father      Social History  Substance Use Topics  . Smoking status: Never Smoker   . Smokeless tobacco: Never Used  . Alcohol Use: No    Allergies as of 06/22/2015 - Review Complete 06/22/2015  Allergen Reaction Noted  . Advair diskus [fluticasone-salmeterol] Shortness Of Breath 10/05/2011  . Albuterol sulfate Shortness Of Breath 06/10/2015  . Atrovent Shortness Of Breath 10/05/2011  . Bethanechol Shortness Of Breath and Other (See Comments) 10/02/2011  . Budesonide-formoterol fumarate Shortness Of Breath 10/02/2011  . Influenza virus vaccine split Anaphylaxis 10/05/2011  . Levofloxacin in d5w Rash 06/21/2015  . Lorazepam Other (See Comments) 06/21/2015  . Xopenex [levalbuterol] Shortness Of Breath 10/02/2011  . Compazine Other (See Comments) 10/02/2011  . Fluoride preparations  10/05/2011  . Lorazepam Other (See Comments) 10/02/2011  . Levofloxacin Rash 10/02/2011  . Potassium-containing compounds Nausea And Vomiting 10/05/2011  . Zofran [ondansetron hcl] Rash 02/28/2013    Review of Systems:    All systems reviewed and negative except where noted in HPI.   Physical Exam:  BP 138/79 mmHg  Pulse 94  Temp(Src) 97.7 F (36.5 C) (Oral)  Ht 5\' 2"  (1.575 m)  Wt 113 lb 9.6 oz (51.529 kg)  BMI 20.77 kg/m2 No LMP recorded. Patient has had a hysterectomy. Psych:  Alert and cooperative. Normal mood and affect. General:   Alert,  Well-developed, well-nourished, pleasant and cooperative in  NAD Head:  Normocephalic and atraumatic. Eyes:  Sclera clear, no icterus.   Conjunctiva pink. Ears:  Normal auditory acuity. Nose:  No deformity, discharge, or lesions. Mouth:  No deformity or lesions,oropharynx pink & moist. Neck:  Supple; no masses or thyromegaly. Lungs:  Respirations even and unlabored.  Clear throughout to auscultation.   No wheezes, crackles, or rhonchi. No acute distress. Heart:  Regular rate and rhythm; no murmurs, clicks, rubs, or gallops. Abdomen:  Normal bowel sounds.  No bruits.  Soft, non-tender and non-distended without masses, hepatosplenomegaly or hernias noted.  No guarding or rebound tenderness.  Negative Carnett sign.   Rectal:  Deferred.  Msk:  Symmetrical without gross deformities.  Good, equal movement & strength bilaterally. Pulses:  Normal pulses noted. Extremities:  No clubbing or edema.  No cyanosis. Neurologic:  Alert and oriented x3;  grossly normal neurologically. Skin:  Intact without significant lesions or rashes.  No jaundice. Lymph Nodes:  No significant cervical adenopathy. Psych:  Alert and cooperative. Normal mood and affect.  Imaging Studies: No results found.  Assessment and Plan:   Kayla Curry is a 44 y.o. y/o female who comes in today with episodic abdominal pain that is a her pain in the epigastric area that radiates downwards and is sensitive to the touch. She also reports chronic back pain. The patient has no association of eating or drinking as exacerbating or relieving the symptoms. The patient also had a CT scan that showed a thickening of the ileum attributes to an infection back in May. The patient has been told to pay attention She has the discomfort and tried to raise her legs thereby flexing the abdominal wall muscles and see if this exacerbates the pain to 1 finger palpation. She has no GI symptoms associated with the pain such as dyspepsia or pain being made worse by what she eats or drinks. If the patient's pain is not  muscular skeletal at the next occurrence she will contact me and she will be set up for an upper endoscopy. Patient has been explained the plan and agrees with it.   Note: This dictation was prepared with Dragon dictation along with smaller phrase technology. Any transcriptional errors that result from this process are unintentional.

## 2015-06-29 ENCOUNTER — Other Ambulatory Visit: Payer: Self-pay | Admitting: *Deleted

## 2015-06-29 MED ORDER — BACLOFEN 20 MG PO TABS: 20.0000 mg | ORAL_TABLET | Freq: Four times a day (QID) | ORAL | Status: DC

## 2015-06-29 NOTE — Telephone Encounter (Signed)
Patient is requesting that rx for baclofen be changed to x4 tablets qd?

## 2015-07-08 ENCOUNTER — Other Ambulatory Visit: Payer: Self-pay | Admitting: Family Medicine

## 2015-07-08 NOTE — Telephone Encounter (Signed)
Pt needs refill on her oxyCODONE-acetaminophen (PERCOCET) 7.5-325 MG tablet   Call back when ready [phone]  Thanks teri

## 2015-07-09 MED ORDER — OXYCODONE-ACETAMINOPHEN 7.5-325 MG PO TABS: ORAL_TABLET | ORAL | Status: DC

## 2015-07-12 ENCOUNTER — Telehealth: Payer: Self-pay | Admitting: Family Medicine

## 2015-07-12 NOTE — Telephone Encounter (Signed)
Pharmacist needs the number of pills pt. Is to take every 4-6 hours prn.  It was not on prescription.

## 2015-07-12 NOTE — Telephone Encounter (Signed)
Clarification given. 

## 2015-07-12 NOTE — Telephone Encounter (Signed)
one

## 2015-07-12 NOTE — Telephone Encounter (Signed)
Pt called about the RX and request that we call [facility] back today if possible b/c she will be out tomorrow. Please see messages below. Thanks TNP

## 2015-08-04 ENCOUNTER — Other Ambulatory Visit: Payer: Self-pay | Admitting: Family Medicine

## 2015-08-04 NOTE — Telephone Encounter (Signed)
Pt contacted office for refill request on the following medications: ° °oxyCODONE-acetaminophen (PERCOCET) 7.5-325 MG tablet  ° °CB#[phone]/MW °

## 2015-08-05 MED ORDER — OXYCODONE-ACETAMINOPHEN 7.5-325 MG PO TABS: ORAL_TABLET | ORAL | Status: DC

## 2015-08-06 ENCOUNTER — Other Ambulatory Visit: Payer: Self-pay | Admitting: Family Medicine

## 2015-08-06 MED ORDER — OXYCODONE-ACETAMINOPHEN 7.5-325 MG PO TABS: ORAL_TABLET | ORAL | Status: DC

## 2015-08-06 MED ORDER — OXYCODONE-ACETAMINOPHEN 7.5-325 MG PO TABS: 1.0000 | ORAL_TABLET | Freq: Four times a day (QID) | ORAL | Status: DC | PRN

## 2015-08-18 ENCOUNTER — Ambulatory Visit: Payer: Self-pay | Admitting: Physician Assistant

## 2015-08-18 ENCOUNTER — Encounter: Payer: Self-pay | Admitting: Physician Assistant

## 2015-08-18 VITALS — BP 130/70 | HR 75 | Temp 98.1°F

## 2015-08-18 DIAGNOSIS — J069 Acute upper respiratory infection, unspecified: Secondary | ICD-10-CM

## 2015-08-18 MED ORDER — FLUCONAZOLE 150 MG PO TABS: 150.0000 mg | ORAL_TABLET | Freq: Once | ORAL | Status: DC

## 2015-08-18 MED ORDER — AMOXICILLIN 875 MG PO TABS: 875.0000 mg | ORAL_TABLET | Freq: Two times a day (BID) | ORAL | Status: DC

## 2015-08-18 NOTE — Progress Notes (Signed)
S: C/o runny nose and congestion for 6 days, no fever, chills, cp/sob, v/d; mucus is green and thick, cough is sporadic, c/o of facial and dental pain.   Using otc meds:   O: PE: vitals wnl, nad,  perrl eomi, normocephalic, tms dull, nasal mucosa red and swollen, throat injected, neck supple no lymph, lungs c t a, cv rrr, neuro intact  A:  Acute uri   P: amoxil 875mg  bid, diflucan, drink fluids, continue regular meds , use otc meds of choice, return if not improving in 5 days, return earlier if worsening

## 2015-08-26 ENCOUNTER — Telehealth: Payer: Self-pay | Admitting: *Deleted

## 2015-08-26 MED ORDER — ESZOPICLONE 2 MG PO TABS: 2.0000 mg | ORAL_TABLET | Freq: Every evening | ORAL | Status: DC | PRN

## 2015-08-26 NOTE — Telephone Encounter (Signed)
Medication called into the pharmacy. Patient advised.

## 2015-08-26 NOTE — Telephone Encounter (Signed)
Please call in Lunesta

## 2015-08-26 NOTE — Telephone Encounter (Signed)
Patient is requesting an rx for lunesta. Patient stated that she use to take Lunesta a few years ago. Now to difficulty sleeping she wants to go back on it.

## 2015-09-01 ENCOUNTER — Other Ambulatory Visit: Payer: Self-pay | Admitting: Family Medicine

## 2015-09-01 MED ORDER — OXYCODONE-ACETAMINOPHEN 7.5-325 MG PO TABS: ORAL_TABLET | ORAL | Status: DC

## 2015-09-01 NOTE — Telephone Encounter (Signed)
Pt contacted office for refill request on the following medications: °oxyCODONE-acetaminophen (PERCOCET) 7.5-325 MG tablet. Thanks TNP °

## 2015-09-28 ENCOUNTER — Other Ambulatory Visit: Payer: Self-pay | Admitting: Family Medicine

## 2015-09-28 MED ORDER — OXYCODONE-ACETAMINOPHEN 7.5-325 MG PO TABS: ORAL_TABLET | ORAL | Status: DC

## 2015-09-28 NOTE — Telephone Encounter (Signed)
Pt contacted office for refill request on the following medications: ° °oxyCODONE-acetaminophen (PERCOCET) 7.5-325 MG tablet  ° °CB#[phone]/MW °

## 2015-10-22 ENCOUNTER — Other Ambulatory Visit: Payer: Self-pay | Admitting: Family Medicine

## 2015-10-22 MED ORDER — OXYCODONE-ACETAMINOPHEN 7.5-325 MG PO TABS: ORAL_TABLET | ORAL | Status: DC

## 2015-10-22 NOTE — Telephone Encounter (Signed)
Pt contacted office for refill request on the following medications: ° °oxyCODONE-acetaminophen (PERCOCET) 7.5-325 MG tablet  ° °CB#[phone]/MW °

## 2015-11-16 ENCOUNTER — Other Ambulatory Visit: Payer: Self-pay | Admitting: Family Medicine

## 2015-11-16 MED ORDER — OXYCODONE-ACETAMINOPHEN 7.5-325 MG PO TABS: ORAL_TABLET | ORAL | Status: DC

## 2015-11-16 NOTE — Telephone Encounter (Signed)
Pt contacted office for refill request on the following medications: oxyCODONE-acetaminophen (PERCOCET) 7.5-325 MG tablet. Pt stated that she will run out of the medication on Friday 11/19/15. Last written on 10/22/15. Please advise. Thanks TNP

## 2015-11-22 ENCOUNTER — Other Ambulatory Visit: Payer: Self-pay | Admitting: Family Medicine

## 2015-12-14 ENCOUNTER — Other Ambulatory Visit: Payer: Self-pay | Admitting: Family Medicine

## 2015-12-14 MED ORDER — OXYCODONE-ACETAMINOPHEN 7.5-325 MG PO TABS: ORAL_TABLET | ORAL | Status: DC

## 2015-12-14 NOTE — Telephone Encounter (Signed)
Pt needs refill oxyCODONE-acetaminophen (PERCOCET) 7.5-325 MG tablet  Thanks Con Memos

## 2015-12-16 ENCOUNTER — Ambulatory Visit: Payer: Self-pay | Admitting: Physician Assistant

## 2015-12-16 ENCOUNTER — Encounter: Payer: Self-pay | Admitting: Physician Assistant

## 2015-12-16 VITALS — BP 150/70 | HR 88 | Temp 98.5°F

## 2015-12-16 DIAGNOSIS — J069 Acute upper respiratory infection, unspecified: Secondary | ICD-10-CM

## 2015-12-16 MED ORDER — AZITHROMYCIN 250 MG PO TABS: ORAL_TABLET | ORAL | Status: DC

## 2015-12-16 MED ORDER — METHYLPREDNISOLONE 4 MG PO TBPK: ORAL_TABLET | ORAL | Status: DC

## 2015-12-16 NOTE — Progress Notes (Signed)
S: C/o cough and congestion for 2 weeks, no fever, chills, cp/sob, v/d; , cough is sporadic, dry, no body aches, used regular meds and alupent nebules which she just ran out of, does ok on methlyprednisone but not reg prednisone  Using otc meds:   O: PE: vitals wnl, nad, perrl eomi, normocephalic, tms dull, nasal mucosa red and swollen, throat injected, neck supple no lymph, lungs c t a, cv rrr, neuro intact  A:  Acute  uri   P: zpack, medrol dose pack, drink fluids, continue regular meds , use otc meds of choice, return if not improving in 5 days, return earlier if worsening , called several pharmacies for alupent without success

## 2015-12-21 ENCOUNTER — Other Ambulatory Visit: Payer: Self-pay | Admitting: Family Medicine

## 2016-01-07 ENCOUNTER — Other Ambulatory Visit: Payer: Self-pay | Admitting: Family Medicine

## 2016-01-07 NOTE — Telephone Encounter (Signed)
Pt needs refill oxyCODONE-acetaminophen (PERCOCET) 7.5-325 MG tablet Taking 12/14/15 -- [name], MD One tablet every 4-5 hours as needed   Thanks Con Memos

## 2016-01-10 MED ORDER — OXYCODONE-ACETAMINOPHEN 7.5-325 MG PO TABS: ORAL_TABLET | ORAL | Status: DC

## 2016-01-18 ENCOUNTER — Other Ambulatory Visit: Payer: Self-pay | Admitting: *Deleted

## 2016-01-18 MED ORDER — GABAPENTIN 600 MG PO TABS: 600.0000 mg | ORAL_TABLET | Freq: Three times a day (TID) | ORAL | Status: DC

## 2016-02-02 ENCOUNTER — Other Ambulatory Visit: Payer: Self-pay | Admitting: Family Medicine

## 2016-02-02 MED ORDER — OXYCODONE-ACETAMINOPHEN 7.5-325 MG PO TABS: ORAL_TABLET | ORAL | Status: DC

## 2016-02-02 NOTE — Telephone Encounter (Signed)
Pt is requesting a refill on oxyCODONE-acetaminophen (PERCOCET) 7.5-325 MG tablet.  Please let pt know when it is ready for pick-up

## 2016-02-08 ENCOUNTER — Telehealth: Payer: Self-pay | Admitting: *Deleted

## 2016-02-09 NOTE — Telephone Encounter (Signed)
Per Liam Graham message from patient, patient states albuterol aerosol for nebulizer is no longer being made. Please check with pharmacist about this. Ii have not heard of it being discontinued. This message doesn't sound right. Thanks.

## 2016-02-09 NOTE — Telephone Encounter (Signed)
Pharmacist stated that this medication is unavailable in solution and pill form. Patient stated that she is unable to take albuterol inhaler due to med causing her respiratory distress. Patient stated that she [facility] ago about what she thought was the alupent in pill form however she could have been mistaken. Pt said that she will [facility] and talk to pharmacist again tomorrow to see which medication he was talking about.

## 2016-02-09 NOTE — Telephone Encounter (Signed)
Patient is requesting rx for Alupent in pill form.

## 2016-02-14 MED ORDER — METAPROTERENOL SULFATE 20 MG PO TABS: 20.0000 mg | ORAL_TABLET | Freq: Three times a day (TID) | ORAL | Status: DC | PRN

## 2016-02-14 NOTE — Telephone Encounter (Signed)
Pharmacist Ro from [facility] called back regarding this medication. Patient has been adamant about getting  Metaproterenol inhaler refilled. Ro  States that it is no longer made as an inhaler, but is available in pill form. The directions for the pill is Metaproterenol 20mg  1 tablet 3-4 times daily as needed. Pharmacist wants to know if you would like to prescribe the pill form that is indicated for patient with Asthma and COPD. Patient told Ro then she cant use albuterol inhalers.  I advised Ro that Dr. Caryn Section was out of the office this week and a message would be sent to him. Call back for Pharmacist Ro is [phone]

## 2016-02-29 ENCOUNTER — Other Ambulatory Visit: Payer: Self-pay | Admitting: Family Medicine

## 2016-02-29 NOTE — Telephone Encounter (Signed)
Pt contacted office for refill request on the following medications: ° °oxyCODONE-acetaminophen (PERCOCET) 7.5-325 MG tablet  ° °CB#[phone]/MW °

## 2016-03-01 MED ORDER — OXYCODONE-ACETAMINOPHEN 7.5-325 MG PO TABS: ORAL_TABLET | ORAL | Status: DC

## 2016-03-28 ENCOUNTER — Other Ambulatory Visit: Payer: Self-pay | Admitting: Family Medicine

## 2016-03-28 MED ORDER — OXYCODONE-ACETAMINOPHEN 7.5-325 MG PO TABS: ORAL_TABLET | ORAL | 0 refills | Status: DC

## 2016-03-28 NOTE — Telephone Encounter (Signed)
Pt contacted office for refill request on the following medications:  oxyCODONE-acetaminophen (PERCOCET) 7.5-325 MG tablet  Pt stated she will be out of the medication on Friday 03/31/16.  Last written: 03/01/16 Last OV: 06/11/15  Please advise. Thanks TNP

## 2016-04-04 ENCOUNTER — Telehealth: Payer: Self-pay | Admitting: Family Medicine

## 2016-04-04 DIAGNOSIS — E876 Hypokalemia: Secondary | ICD-10-CM

## 2016-04-04 NOTE — Telephone Encounter (Signed)
Patient advised.

## 2016-04-04 NOTE — Telephone Encounter (Signed)
Pt is requesting a lab slip to have her potassium checked due to having a lot of throwing up.  CB#[phone]/MW

## 2016-04-04 NOTE — Telephone Encounter (Signed)
Order is ready to pick up at front desk.

## 2016-04-05 ENCOUNTER — Other Ambulatory Visit: Payer: Self-pay | Admitting: Family Medicine

## 2016-04-05 ENCOUNTER — Telehealth: Payer: Self-pay

## 2016-04-05 LAB — POTASSIUM: Potassium: 2.6 mmol/L — ABNORMAL LOW (ref 3.5–5.2)

## 2016-04-05 NOTE — Telephone Encounter (Signed)
-----   Message from [name], MD sent at 04/05/2016  7:48 AM EDT ----- Potassium level is very low at 2.6. If she is having nausea and vomiting she should go to ER for IV fluids. If not she needs to double her potassium supplement.

## 2016-04-05 NOTE — Telephone Encounter (Signed)
Patient advised as below. Patient reports that she has tried taking potassium supplements in the past and has caused her to have more vomiting. Patient reports that she will go to ER tomorrow morning if she is not feeling better.

## 2016-04-06 ENCOUNTER — Emergency Department: Payer: Managed Care, Other (non HMO)

## 2016-04-06 ENCOUNTER — Encounter: Payer: Self-pay | Admitting: Emergency Medicine

## 2016-04-06 ENCOUNTER — Observation Stay
Admission: EM | Admit: 2016-04-06 | Discharge: 2016-04-07 | Disposition: A | Discharge status: Home / Self Care | Payer: Managed Care, Other (non HMO) | Attending: Specialist | Admitting: Specialist

## 2016-04-06 DIAGNOSIS — R079 Chest pain, unspecified: Secondary | ICD-10-CM | POA: Diagnosis present

## 2016-04-06 DIAGNOSIS — Z888 Allergy status to other drugs, medicaments and biological substances status: Secondary | ICD-10-CM | POA: Diagnosis not present

## 2016-04-06 DIAGNOSIS — Z9049 Acquired absence of other specified parts of digestive tract: Secondary | ICD-10-CM | POA: Insufficient documentation

## 2016-04-06 DIAGNOSIS — E876 Hypokalemia: Principal | ICD-10-CM | POA: Insufficient documentation

## 2016-04-06 DIAGNOSIS — Z9889 Other specified postprocedural states: Secondary | ICD-10-CM | POA: Insufficient documentation

## 2016-04-06 DIAGNOSIS — Z90710 Acquired absence of both cervix and uterus: Secondary | ICD-10-CM | POA: Diagnosis not present

## 2016-04-06 DIAGNOSIS — E278 Other specified disorders of adrenal gland: Secondary | ICD-10-CM | POA: Diagnosis not present

## 2016-04-06 DIAGNOSIS — G8929 Other chronic pain: Secondary | ICD-10-CM | POA: Diagnosis not present

## 2016-04-06 DIAGNOSIS — R1013 Epigastric pain: Secondary | ICD-10-CM

## 2016-04-06 DIAGNOSIS — K219 Gastro-esophageal reflux disease without esophagitis: Secondary | ICD-10-CM | POA: Insufficient documentation

## 2016-04-06 DIAGNOSIS — Z79891 Long term (current) use of opiate analgesic: Secondary | ICD-10-CM | POA: Insufficient documentation

## 2016-04-06 DIAGNOSIS — Z8679 Personal history of other diseases of the circulatory system: Secondary | ICD-10-CM | POA: Diagnosis not present

## 2016-04-06 DIAGNOSIS — Z8249 Family history of ischemic heart disease and other diseases of the circulatory system: Secondary | ICD-10-CM | POA: Insufficient documentation

## 2016-04-06 DIAGNOSIS — R339 Retention of urine, unspecified: Secondary | ICD-10-CM | POA: Insufficient documentation

## 2016-04-06 DIAGNOSIS — Z79899 Other long term (current) drug therapy: Secondary | ICD-10-CM | POA: Insufficient documentation

## 2016-04-06 DIAGNOSIS — R109 Unspecified abdominal pain: Secondary | ICD-10-CM | POA: Diagnosis not present

## 2016-04-06 DIAGNOSIS — Z9109 Other allergy status, other than to drugs and biological substances: Secondary | ICD-10-CM | POA: Diagnosis not present

## 2016-04-06 DIAGNOSIS — E039 Hypothyroidism, unspecified: Secondary | ICD-10-CM | POA: Insufficient documentation

## 2016-04-06 DIAGNOSIS — Z887 Allergy status to serum and vaccine status: Secondary | ICD-10-CM | POA: Insufficient documentation

## 2016-04-06 LAB — CBC
HEMATOCRIT: 43.6 % (ref 35.0–47.0)
HEMOGLOBIN: 15.3 g/dL (ref 12.0–16.0)
MCH: 31.3 pg (ref 26.0–34.0)
MCHC: 35.1 g/dL (ref 32.0–36.0)
MCV: 89.3 fL (ref 80.0–100.0)
Platelets: 236 10*3/uL (ref 150–440)
RBC: 4.88 MIL/uL (ref 3.80–5.20)
RDW: 12.7 % (ref 11.5–14.5)
WBC: 9.4 10*3/uL (ref 3.6–11.0)

## 2016-04-06 LAB — BASIC METABOLIC PANEL
Anion gap: 12 (ref 5–15)
BUN: 8 mg/dL (ref 6–20)
CO2: 29 mmol/L (ref 22–32)
Calcium: 10.5 mg/dL — ABNORMAL HIGH (ref 8.9–10.3)
Chloride: 98 mmol/L — ABNORMAL LOW (ref 101–111)
Creatinine, Ser: 0.67 mg/dL (ref 0.44–1.00)
GFR calc Af Amer: >60 mL/min (ref >60)
GLUCOSE: 98 mg/dL (ref 65–99)
POTASSIUM: 2.1 mmol/L — AB (ref 3.5–5.1)
Sodium: 139 mmol/L (ref 135–145)

## 2016-04-06 LAB — POTASSIUM: Potassium: 2.5 mmol/L — CL (ref 3.5–5.1)

## 2016-04-06 LAB — MAGNESIUM: Magnesium: 1.8 mg/dL (ref 1.7–2.4)

## 2016-04-06 LAB — TSH: TSH: 2.304 u[IU]/mL (ref 0.350–4.500)

## 2016-04-06 LAB — TROPONIN I: Troponin I: <0.03 ng/mL (ref <0.03)

## 2016-04-06 MED ORDER — ENOXAPARIN SODIUM 40 MG/0.4ML ~~LOC~~ SOLN
40.0000 mg | SUBCUTANEOUS | Status: DC
  Administered 2016-04-06: 40 mg via SUBCUTANEOUS
  Filled 2016-04-06: qty 0.4

## 2016-04-06 MED ORDER — ATORVASTATIN CALCIUM 10 MG PO TABS
10.0000 mg | ORAL_TABLET | Freq: Every day | ORAL | Status: DC
  Filled 2016-04-06: qty 1

## 2016-04-06 MED ORDER — PROMETHAZINE HCL 25 MG/ML IJ SOLN
12.5000 mg | Freq: Once | INTRAMUSCULAR | Status: AC
  Administered 2016-04-06: 12.5 mg via INTRAVENOUS
  Filled 2016-04-06: qty 1

## 2016-04-06 MED ORDER — GABAPENTIN 300 MG PO CAPS
600.0000 mg | ORAL_CAPSULE | Freq: Three times a day (TID) | ORAL | Status: DC
  Administered 2016-04-06 – 2016-04-07 (×3): 600 mg via ORAL
  Filled 2016-04-06 (×3): qty 2

## 2016-04-06 MED ORDER — ASPIRIN EC 325 MG PO TBEC
325.0000 mg | DELAYED_RELEASE_TABLET | Freq: Every day | ORAL | Status: DC
  Administered 2016-04-06 – 2016-04-07 (×2): 325 mg via ORAL
  Filled 2016-04-06: qty 1

## 2016-04-06 MED ORDER — MORPHINE SULFATE (PF) 4 MG/ML IV SOLN
4.0000 mg | Freq: Once | INTRAVENOUS | Status: AC
  Administered 2016-04-06: 4 mg via INTRAVENOUS
  Filled 2016-04-06: qty 1

## 2016-04-06 MED ORDER — SODIUM CHLORIDE 0.9% FLUSH: 3.0000 mL | Freq: Two times a day (BID) | INTRAVENOUS | Status: DC

## 2016-04-06 MED ORDER — PROMETHAZINE HCL 25 MG PO TABS
12.5000 mg | ORAL_TABLET | Freq: Four times a day (QID) | ORAL | Status: DC | PRN
  Administered 2016-04-07: 12.5 mg via ORAL
  Filled 2016-04-06: qty 1

## 2016-04-06 MED ORDER — SPIRONOLACTONE 25 MG PO TABS
100.0000 mg | ORAL_TABLET | Freq: Two times a day (BID) | ORAL | Status: DC
  Administered 2016-04-06 – 2016-04-07 (×2): 100 mg via ORAL
  Filled 2016-04-06 (×2): qty 4

## 2016-04-06 MED ORDER — PANTOPRAZOLE SODIUM 40 MG PO TBEC
40.0000 mg | DELAYED_RELEASE_TABLET | Freq: Every day | ORAL | Status: DC
  Administered 2016-04-07: 40 mg via ORAL
  Filled 2016-04-06: qty 1

## 2016-04-06 MED ORDER — OXYCODONE-ACETAMINOPHEN 7.5-325 MG PO TABS: 1.0000 | ORAL_TABLET | Freq: Four times a day (QID) | ORAL | Status: DC | PRN

## 2016-04-06 MED ORDER — POTASSIUM CHLORIDE IN NACL 20-0.9 MEQ/L-% IV SOLN
INTRAVENOUS | Status: AC
  Administered 2016-04-06 – 2016-04-07 (×2): via INTRAVENOUS
  Filled 2016-04-06 (×2): qty 1000

## 2016-04-06 MED ORDER — BACLOFEN 10 MG PO TABS
20.0000 mg | ORAL_TABLET | Freq: Four times a day (QID) | ORAL | Status: DC
  Administered 2016-04-06 – 2016-04-07 (×3): 20 mg via ORAL
  Filled 2016-04-06 (×3): qty 2

## 2016-04-06 MED ORDER — ASPIRIN EC 325 MG PO TBEC
DELAYED_RELEASE_TABLET | ORAL | Status: AC
  Filled 2016-04-06: qty 1

## 2016-04-06 MED ORDER — ASPIRIN EC 325 MG PO TBEC: 325.0000 mg | DELAYED_RELEASE_TABLET | Freq: Every day | ORAL | Status: DC

## 2016-04-06 MED ORDER — POTASSIUM CHLORIDE 10 MEQ/100ML IV SOLN
10.0000 meq | INTRAVENOUS | Status: AC
  Administered 2016-04-06 (×2): 10 meq via INTRAVENOUS
  Filled 2016-04-06 (×4): qty 100

## 2016-04-06 MED ORDER — KETOROLAC TROMETHAMINE 15 MG/ML IJ SOLN: 15.0000 mg | Freq: Four times a day (QID) | INTRAMUSCULAR | Status: DC

## 2016-04-06 MED ORDER — KETOROLAC TROMETHAMINE 15 MG/ML IJ SOLN
15.0000 mg | Freq: Four times a day (QID) | INTRAMUSCULAR | Status: DC | PRN
  Administered 2016-04-06 – 2016-04-07 (×3): 15 mg via INTRAVENOUS
  Filled 2016-04-06 (×3): qty 1

## 2016-04-06 MED ORDER — ACETAMINOPHEN 650 MG RE SUPP: 650.0000 mg | Freq: Four times a day (QID) | RECTAL | Status: DC | PRN

## 2016-04-06 MED ORDER — ASPIRIN 81 MG PO CHEW
CHEWABLE_TABLET | ORAL | Status: AC
  Filled 2016-04-06: qty 4

## 2016-04-06 MED ORDER — ACETAMINOPHEN 325 MG PO TABS: 650.0000 mg | ORAL_TABLET | Freq: Four times a day (QID) | ORAL | Status: DC | PRN

## 2016-04-06 MED ORDER — LEVOTHYROXINE SODIUM 75 MCG PO TABS
75.0000 ug | ORAL_TABLET | Freq: Every day | ORAL | Status: DC
  Filled 2016-04-06: qty 1

## 2016-04-06 MED ORDER — POTASSIUM CHLORIDE IN NACL 20-0.9 MEQ/L-% IV SOLN
INTRAVENOUS | Status: AC
  Filled 2016-04-06: qty 1000

## 2016-04-06 MED ORDER — DOCUSATE SODIUM 100 MG PO CAPS
100.0000 mg | ORAL_CAPSULE | Freq: Two times a day (BID) | ORAL | Status: DC
  Administered 2016-04-06 – 2016-04-07 (×2): 100 mg via ORAL
  Filled 2016-04-06 (×2): qty 1

## 2016-04-06 NOTE — Progress Notes (Signed)
Anticoagulation monitoring(Lovenox):  45 yo  female ordered Lovenox 30 mg Q24h  Filed Weights   04/06/16 1141 04/06/16 1644  Weight: 103 lb (46.7 kg) 102 lb 4.8 oz (46.4 kg)   BMI    Lab Results  Component Value Date   CREATININE 0.67 04/06/2016   CREATININE 0.68 06/11/2015   CREATININE 1.06 (H) 01/13/2015   Estimated Creatinine Clearance: 66.4 mL/min (by C-G formula based on SCr of 0.8 mg/dL). Hemoglobin & Hematocrit     Component Value Date/Time   HGB 15.3 04/06/2016 1036   HCT 43.6 04/06/2016 1036     Per Protocol for Patient with estCrcl > 30 ml/min and BMI > 40, will transition to Lovenox 40 mg Q24h.

## 2016-04-06 NOTE — ED Triage Notes (Signed)
Reports ongoing abd pain for several months, seeing a specialist for it but today having chest pain also. Skin w/d with good color.

## 2016-04-06 NOTE — ED Provider Notes (Signed)
[facility] Emergency Department Provider Note  ____________________________________________   First MD Initiated Contact with Patient 04/06/16 1138     (approximate)  I have reviewed the triage vital signs and the nursing notes.   HISTORY  Chief Complaint Chest Pain    HPI Kayla Curry is a 45 y.o. female with a history of chronic abdominal pain as well as nausea vomiting and low potassium was presenting today for chest pain. Said that the chest pain is in the middle of her chest and is 9 out of 10. Says that the pain feels sharp and radiates through her back. Does not report any associated shortness of breath. Says that she also has epigastric pain which is chronic. She's been seen by Dr. Allen Norris in the past gastroenterology service who thought it was more musculoskeletal pain. She says the pain is epigastric and sharp. She says that antacids help to relieve the pain. Says that she has a chronic vomiting every several days but denies any diarrhea.   Past Medical History:  Diagnosis Date  . History of pelvic fracture   . History of supraventricular tachycardia    PER PT ON 02-28-2013 NO ISSUES SINCE ABLATION IN 2005  . 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 Mass of adrenal gland ([place])   . Pelvic pain   . S/P ablation of ventricular arrhythmia HX SVT  [place]--  LAST VISIT 2012 -- PT RELEASED FROM CARE ON PRN BASIS  . Urinary retention    Self cath TID and prn    Patient Active Problem List   Diagnosis Date Noted  . Back ache 06/10/2015  . GERD (gastroesophageal reflux disease) 06/10/2015  . Insomnia 06/10/2015  . Menopausal disorder 06/10/2015  . Neurogenic bladder disorder 06/10/2015  . Pulmonary nodule 06/29/2011  . Vitamin D deficiency 11/07/2009  . Alkalosis 11/04/2009  . Palpitations 04/24/2006  . Asthma, extrinsic 09/29/2005  .  Chronic pain associated with significant psychosocial dysfunction 09/29/2005  . Hypokalemia 09/29/2005  . Hypothyroidism 09/29/2005  . Adrenal mass ([place]) 08/21/2001    Past Surgical History:  Procedure Laterality Date  . APPENDECTOMY  1994   EXPL. LAP.  Marland Kitchen CARDIAC ELECTROPHYSIOLOGY STUDY AND ABLATION  2005- AT [facility]   SVT--  PT STATES NO ISSUES SINCE   . INTERSTIM IMPLANT PLACEMENT  [facility]  . INTERSTIM IMPLANT PLACEMENT  10/05/2011   Procedure: Barrie Lyme IMPLANT FIRST STAGE;  Surgeon: [name], MD;  Location: [facility];  Service: Urology;  Laterality: N/A;  . INTERSTIM IMPLANT PLACEMENT  10/05/2011   Procedure: Barrie Lyme IMPLANT SECOND STAGE;  Surgeon: [name], MD;  Location: [facility];  Service: Urology;  Laterality: N/A;  . INTERSTIM IMPLANT REVISION N/A 03/06/2013   Procedure: REVISION OF Barrie Lyme;  Surgeon: [name], MD;  Location: [facility];  Service: Urology;  Laterality: N/A;  . LAPAROSCOPIC CHOLECYSTECTOMY  2001  . TONSILLECTOMY  1998  . VAGINAL HYSTERECTOMY  2003    Prior to Admission medications   Medication Sig Start Date End Date Taking? Authorizing Provider  baclofen (LIORESAL) 20 MG tablet TAKE 1 TABLET (20 MG TOTAL) BY MOUTH 4 (FOUR) TIMES DAILY. 12/21/15  Yes [name], MD  esomeprazole (NEXIUM) 40 MG capsule Take 1 capsule (40 mg total) by mouth daily before breakfast. 06/04/12  Yes Norberto Sorenson  Sindy Guadeloupe, MD  gabapentin (NEURONTIN) 600 MG tablet Take 1 tablet (600 mg total) by mouth 3 (three) times daily. 01/18/16  Yes [name], MD  metaproterenol (ALUPENT) 20 MG tablet Take 1 tablet (20 mg total) by mouth 3 (three) times daily as needed. 02/14/16  Yes [name], MD  oxyCODONE-acetaminophen (PERCOCET) 7.5-325 MG tablet One tablet every 4-5 hours as needed Patient taking differently: Take 1 tablet by mouth every 6 (six) hours as needed. One tablet every 4-5 hours as  needed 03/28/16  Yes [name], MD  spironolactone (ALDACTONE) 100 MG tablet Take 100 mg by mouth 2 (two) times daily.   Yes Historical Provider, MD  SYNTHROID 75 MCG tablet TAKE 1 TABLET BY MOUTH DAILY 05/31/15  Yes [name], MD  tiZANidine (ZANAFLEX) 4 MG tablet TAKE 1 TABLET (4 MG TOTAL) BY MOUTH 2 (TWO) TIMES DAILY. 11/22/15  Yes [name], MD    Allergies Advair diskus [fluticasone-salmeterol]; Albuterol sulfate; Atrovent; Bethanechol; Budesonide-formoterol fumarate; Influenza virus vaccine split; Levofloxacin in d5w; Lorazepam; Xopenex [levalbuterol]; Compazine; Fluoride preparations; Lorazepam; Levofloxacin; Potassium-containing compounds; and Zofran [ondansetron hcl]  Family History  Problem Relation Age of Onset  . Colon cancer Neg Hx   . Hypertension Father     Social History Social History  Substance Use Topics  . Smoking status: Never Smoker  . Smokeless tobacco: Never Used  . Alcohol use No    Review of Systems Constitutional: No fever/chills Eyes: No visual changes. ENT: No sore throat. Cardiovascular: As above Respiratory: Denies shortness of breath. Gastrointestinal: No abdominal pain.  No nausea, no vomiting.  No diarrhea.  No constipation. Genitourinary: Negative for dysuria. Musculoskeletal: Negative for back pain. Skin: Negative for rash. Neurological: Negative for headaches, focal weakness or numbness.  10-point ROS otherwise negative.  ____________________________________________   PHYSICAL EXAM:  VITAL SIGNS: ED Triage Vitals  Enc Vitals Group     BP 04/06/16 1141 (!) 157/77     Pulse Rate 04/06/16 1141 78     Resp 04/06/16 1141 18     Temp --      Temp src --      SpO2 04/06/16 1141 99 %     Weight 04/06/16 1141 103 lb (46.7 kg)     Height 04/06/16 1141 5\' 2"  (1.575 m)     Head Circumference --      Peak Flow --      Pain Score 04/06/16 1035 6     Pain Loc --      Pain Edu? --      Excl. in [place]? --     Constitutional:  Alert and oriented. Well appearing and in no acute distress. Eyes: Conjunctivae are normal. PERRL. EOMI. Head: Atraumatic. Nose: No congestion/rhinnorhea. Mouth/Throat: Mucous membranes are moist.   Neck: No stridor.   Cardiovascular: Normal rate, regular rhythm. Grossly normal heart sounds.  Reproducible chest pain with palpation to the mid sternum. Respiratory: Normal respiratory effort.  No retractions. Lungs CTAB. Gastrointestinal: Soft with moderate epigastric tenderness. There is a negative Murphy sign. There is no left upper quadrant tenderness. No distention. No abdominal bruits. No CVA tenderness. Musculoskeletal: No lower extremity tenderness nor edema.  No joint effusions. Neurologic:  Normal speech and language. No gross focal neurologic deficits are appreciated.  Skin:  Skin is warm, dry and intact. No rash noted. Psychiatric: Mood and affect are normal. Speech and behavior are normal.  ____________________________________________   LABS (all labs ordered are listed, but only  abnormal results are displayed)  Labs Reviewed  BASIC METABOLIC PANEL - Abnormal; Notable for the following:       Result Value   Potassium 2.1 (*)    Chloride 98 (*)    Calcium 10.5 (*)    All other components within normal limits  CBC  TROPONIN I  TSH   ____________________________________________  EKG  ED ECG REPORT I, Doran Stabler, the attending physician, personally viewed and interpreted this ECG.   Date: 04/06/2016  EKG Time: 1029  Rate: 89  Rhythm: Sinus rhythm with short PR  Axis: Normal axis  Intervals: Short PR  ST&T Change: No ST segment elevation or depression. No abnormal T-wave inversion. U waves are present.  ____________________________________________  RADIOLOGY  DG Chest 2 View (Accession [id number]) (Order [id number])  Imaging  Date: 04/06/2016 Department: [facility] EMERGENCY DEPARTMENT Released By: Kerney Elbe, RN (auto-released)  Authorizing: Orbie Pyo, MD  PACS Images   Show images for DG Chest 2 View  Study Result   CLINICAL DATA:  Chest pain  EXAM: CHEST  2 VIEW  COMPARISON:  11/30/2014  FINDINGS: The heart size and mediastinal contours are within normal limits. Both lungs are clear. The visualized skeletal structures are unremarkable.  IMPRESSION: No active cardiopulmonary disease.   Electronically Signed   By: Franchot Gallo M.D.   On: 04/06/2016 11:34     ____________________________________________   PROCEDURES  Procedure(s) performed:   Procedures  Critical Care performed:   ____________________________________________   INITIAL IMPRESSION / ASSESSMENT AND PLAN / ED COURSE  Pertinent labs & imaging results that were available during my care of the patient were reviewed by me and considered in my medical decision making (see chart for details).  Patient with critically low potassium with U waves on the EKG. Patient says that she is unable to take by mouth potassium because it makes her vomit. Will be given IV potassium and admitted to the hospital. Signed out to Dr. Margaretmary Eddy.  Explained the diagnosis as well as the need for [place] and plan to the patient and she is understanding like to comply.  Clinical Course     ____________________________________________   FINAL CLINICAL IMPRESSION(S) / ED DIAGNOSES  Chest pain. Hypokalemia.    NEW MEDICATIONS STARTED DURING THIS VISIT:  New Prescriptions   No medications on file     Note:  This document was prepared using Dragon voice recognition software and may include unintentional dictation errors.    Orbie Pyo, MD 04/06/16 1210

## 2016-04-06 NOTE — H&P (Signed)
Kayla Curry at [place] NAME: Kayla Curry    MR#:  [id number]  DATE OF BIRTH:  1970-11-26  DATE OF ADMISSION:  04/06/2016  PRIMARY CARE PHYSICIAN: Lelon Huh, MD   REQUESTING/REFERRING PHYSICIAN: Orbie Pyo, MD  CHIEF COMPLAINT:   Chest pain HISTORY OF PRESENT ILLNESS:  Kayla Curry  is a 45 y.o. female with a known history of Chronic hypokalemia, baseline potassium at around 3, sees [facility] nephrology for adrenal mass is present in the emergency department with chief complaint of chest pain. Patient reports chest pain in the midsternal area without any radiation. After giving IV morphine pain is much better. Denies any shortness of breath or dizziness. No similar complaints in the past. Has history of ventricular arrhythmia in the past and had ablation done several years ago. Resting comfortably during my examination. Feels weak and tired  PAST MEDICAL HISTORY:   Past Medical History:  Diagnosis Date  . History of pelvic fracture   . History of supraventricular tachycardia    PER PT ON 02-28-2013 NO ISSUES SINCE ABLATION IN 2005  . 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 Mass of adrenal gland ([place])   . Pelvic pain   . S/P ablation of ventricular arrhythmia HX SVT  [place]--  LAST VISIT 2012 -- PT RELEASED FROM CARE ON PRN BASIS  . Urinary retention    Self cath TID and prn    PAST SURGICAL HISTOIRY:   Past Surgical History:  Procedure Laterality Date  . APPENDECTOMY  1994   EXPL. LAP.  Marland Kitchen CARDIAC ELECTROPHYSIOLOGY STUDY AND ABLATION  2005- AT [facility]   SVT--  PT STATES NO ISSUES SINCE   . INTERSTIM IMPLANT PLACEMENT  [facility]  . INTERSTIM IMPLANT PLACEMENT  10/05/2011   Procedure: Barrie Lyme IMPLANT FIRST STAGE;  Surgeon: Kayla Packer, MD;  Location[facility];  Service:  Urology;  Laterality: N/A;  . INTERSTIM IMPLANT PLACEMENT  10/05/2011   Procedure: Barrie Lyme IMPLANT SECOND STAGE;  Surgeon: Kayla Packer, MD;  Location: [facility];  Service: Urology;  Laterality: N/A;  . INTERSTIM IMPLANT REVISION N/A 03/06/2013   Procedure: REVISION OF Barrie Lyme;  Surgeon: Kayla Packer, MD;  Location: [facility];  Service: Urology;  Laterality: N/A;  . LAPAROSCOPIC CHOLECYSTECTOMY  2001  . TONSILLECTOMY  1998  . VAGINAL HYSTERECTOMY  2003    SOCIAL HISTORY:   Social History  Substance Use Topics  . Smoking status: Never Smoker  . Smokeless tobacco: Never Used  . Alcohol use No    FAMILY HISTORY:   Family History  Problem Relation Age of Onset  . Colon cancer Neg Hx   . Hypertension Father     DRUG ALLERGIES:   Allergies  Allergen Reactions  . Advair Diskus [Fluticasone-Salmeterol] Shortness Of Breath  . Albuterol Sulfate Shortness Of Breath  . Atrovent Shortness Of Breath  . Bethanechol Shortness Of Breath and Other (See Comments)    RESPIRATORY DISTRESS  . Budesonide-Formoterol Fumarate Shortness Of Breath  . Influenza Virus Vaccine Split Anaphylaxis  . Levofloxacin In D5w Rash  . Lorazepam Other (See Comments)    Other Reaction: OTHER REACTION  . Xopenex [Levalbuterol] Shortness Of Breath  . Compazine Other (See Comments)    DYSTONIC  . Fluoride Preparations   . Lorazepam Other (  See Comments)    "HAS OPPOSITE EFFECT"  . Levofloxacin Rash  . Potassium-Containing Compounds Nausea And Vomiting    Just the po potassium  No problem with iv  . Zofran [Ondansetron Hcl] Rash    REVIEW OF SYSTEMS:  CONSTITUTIONAL: No fever, fatigue or weakness.  EYES: No blurred or double vision.  EARS, NOSE, AND THROAT: No tinnitus or ear pain.  RESPIRATORY: No cough, shortness of breath, wheezing or hemoptysis.  CARDIOVASCULAR: Reports chest pain in the midsternal area, denies orthopnea, edema.  GASTROINTESTINAL:    reportsnausea,  deniesvomiting, diarrhea  , chronic abdominal pain.  GENITOURINARY: No dysuria, hematuria.  ENDOCRINE: No polyuria, nocturia,  HEMATOLOGY: No anemia, easy bruising or bleeding SKIN: No rash or lesion. MUSCULOSKELETAL: No joint pain or arthritis.   NEUROLOGIC: No tingling, numbness, weakness.  PSYCHIATRY: No anxiety or depression.   MEDICATIONS AT HOME:   Prior to Admission medications   Medication Sig Start Date End Date Taking? Authorizing Provider  baclofen (LIORESAL) 20 MG tablet TAKE 1 TABLET (20 MG TOTAL) BY MOUTH 4 (FOUR) TIMES DAILY. 12/21/15  Yes [name], MD  esomeprazole (NEXIUM) 40 MG capsule Take 1 capsule (40 mg total) by mouth daily before breakfast. 06/04/12  Yes Ladene Artist, MD  gabapentin (NEURONTIN) 600 MG tablet Take 1 tablet (600 mg total) by mouth 3 (three) times daily. 01/18/16  Yes [name], MD  metaproterenol (ALUPENT) 20 MG tablet Take 1 tablet (20 mg total) by mouth 3 (three) times daily as needed. 02/14/16  Yes [name], MD  oxyCODONE-acetaminophen (PERCOCET) 7.5-325 MG tablet One tablet every 4-5 hours as needed Patient taking differently: Take 1 tablet by mouth every 6 (six) hours as needed. One tablet every 4-5 hours as needed 03/28/16  Yes [name], MD  spironolactone (ALDACTONE) 100 MG tablet Take 100 mg by mouth 2 (two) times daily.   Yes Historical Provider, MD  SYNTHROID 75 MCG tablet TAKE 1 TABLET BY MOUTH DAILY 05/31/15  Yes [name], MD  tiZANidine (ZANAFLEX) 4 MG tablet TAKE 1 TABLET (4 MG TOTAL) BY MOUTH 2 (TWO) TIMES DAILY. 11/22/15  Yes [name], MD      VITAL SIGNS:  Blood pressure 123/70, pulse 86, resp. rate 16, height 5\' 2"  (1.575 m), weight 46.7 kg (103 lb), SpO2 100 %.  PHYSICAL EXAMINATION:  GENERAL:  45 y.o.-year-old patient lying in the bed with no acute distress.  cachectic  EYES: Pupils equal, round, reactive to light and accommodation. No scleral icterus. Extraocular muscles  intact.  HEENT: Head atraumatic, normocephalic. Oropharynx and nasopharynx clear.  NECK:  Supple, no jugular venous distention. No thyroid enlargement, no tenderness.  LUNGS: Normal breath sounds bilaterally, no wheezing, rales,rhonchi or crepitation. No use of accessory muscles of respiration.  CARDIOVASCULAR:  reproducible midsternal chest tenderness S1, S2 normal. No murmurs, rubs, or gallops.  ABDOMEN: Soft, nontender, nondistended. Bowel sounds present. No organomegaly or mass.  EXTREMITIES: No pedal edema, cyanosis, or clubbing.  NEUROLOGIC: Cranial nerves II through XII are intact. Muscle strength 5/5 in all extremities. Sensation intact. Gait not checked.  PSYCHIATRIC: The patient is alert and oriented x 3.  SKIN: No obvious rash, lesion, or ulcer.   LABORATORY PANEL:   CBC  Recent Labs Lab 04/06/16 1036  WBC 9.4  HGB 15.3  HCT 43.6  PLT 236   ------------------------------------------------------------------------------------------------------------------  Chemistries   Recent Labs Lab 04/06/16 1036  NA 139  K 2.1*  CL 98*  CO2 29  GLUCOSE 98  BUN 8  CREATININE 0.67  CALCIUM 10.5*  MG 1.8   ------------------------------------------------------------------------------------------------------------------  Cardiac Enzymes  Recent Labs Lab 04/06/16 1036  TROPONINI <0.03   ------------------------------------------------------------------------------------------------------------------  RADIOLOGY:  Dg Chest 2 View  Result Date: 04/06/2016 CLINICAL DATA:  Chest pain EXAM: CHEST  2 VIEW COMPARISON:  11/30/2014 FINDINGS: The heart size and mediastinal contours are within normal limits. Both lungs are clear. The visualized skeletal structures are unremarkable. IMPRESSION: No active cardiopulmonary disease. Electronically Signed   By: Franchot Gallo M.D.   On: 04/06/2016 11:34    EKG:   Orders placed or performed during the hospital encounter of 04/06/16  .  EKG 12-Lead  . EKG 12-Lead  . ED EKG within 10 minutes  . ED EKG within 10 minutes    IMPRESSION AND PLAN:    Patient is coming with chest pain. Has a history of chronic hypokalemia secondary to internal mass and follows up with [facility] nephrology. Baseline potassium is at around 3.0 could not take by mouth potassium and taking spironolactone as recommended by her nephrology to increase her potassium level. Today her potassium was found to be at 2.1  EKG with no ST-T wave changes   #Acute on chronic hypokalemia secondary to chronic adrenal mass Admit patient to telemetry Provide IV potassium and IV fluids with potassium Magnesium level is normal at 1.8 Recheck potassium at 1700 and repeat as needed Continue spironolactone which is her home medication Consult placed to [facility] nephrology  #Chest pain Monitor patient on telemetry Cycle cardiac biomarkers Provide aspirin and statin Check fasting lipid panel Consult cardiology  #Hypothyroidism continue Synthroid  #Chronic abdominal pain probably muscular per GI Pain medications as needed basis Outpatient follow-up with Dr. Allen Norris  as suggested by him  #Chronic adrenal mass-right-sided Nonresectable as reported by the patient Continue follow-up with oncology as recommended at [facility]     All the records are reviewed and case discussed with ED provider. Management plans discussed with the patient,  husband and 2 children at bedside and they are in agreement.  CODE STATUS: FC,Husband  TOTAL TIME TAKING CARE OF THIS PATIENT: 45 minutes.   Note: This dictation was prepared with Dragon dictation along with smaller phrase technology. Any transcriptional errors that result from this process are unintentional.  Nicholes Mango M.D on 04/06/2016 at 3:03 PM  Between 7am to 6pm - Pager - [phone]  After 6pm go to [email or web address] - password [email or web address] [facility]  Office  [phone]  CC: Primary care physician; Lelon Huh, MD

## 2016-04-07 ENCOUNTER — Inpatient Hospital Stay: Admit: 2016-04-07 | Payer: Managed Care, Other (non HMO)

## 2016-04-07 ENCOUNTER — Telehealth: Payer: Self-pay

## 2016-04-07 DIAGNOSIS — R079 Chest pain, unspecified: Secondary | ICD-10-CM | POA: Diagnosis present

## 2016-04-07 DIAGNOSIS — E876 Hypokalemia: Secondary | ICD-10-CM

## 2016-04-07 LAB — CBC
HCT: 36.5 % (ref 35.0–47.0)
Hemoglobin: 12.5 g/dL (ref 12.0–16.0)
MCH: 31.2 pg (ref 26.0–34.0)
MCHC: 34.3 g/dL (ref 32.0–36.0)
MCV: 91.1 fL (ref 80.0–100.0)
PLATELETS: 169 10*3/uL (ref 150–440)
RBC: 4 MIL/uL (ref 3.80–5.20)
RDW: 12.9 % (ref 11.5–14.5)
WBC: 5.5 10*3/uL (ref 3.6–11.0)

## 2016-04-07 LAB — LIPID PANEL
CHOLESTEROL: 138 mg/dL (ref 0–200)
HDL: 60 mg/dL (ref >40)
LDL Cholesterol: 58 mg/dL (ref 0–99)
Total CHOL/HDL Ratio: 2.3 RATIO
Triglycerides: 99 mg/dL (ref <150)
VLDL: 20 mg/dL (ref 0–40)

## 2016-04-07 LAB — BASIC METABOLIC PANEL
Anion gap: 4 — ABNORMAL LOW (ref 5–15)
BUN: 11 mg/dL (ref 6–20)
CALCIUM: 8.1 mg/dL — AB (ref 8.9–10.3)
CO2: 30 mmol/L (ref 22–32)
CREATININE: 0.56 mg/dL (ref 0.44–1.00)
Chloride: 107 mmol/L (ref 101–111)
GFR calc Af Amer: >60 mL/min (ref >60)
GFR calc non Af Amer: >60 mL/min (ref >60)
GLUCOSE: 90 mg/dL (ref 65–99)
Potassium: 2.5 mmol/L — CL (ref 3.5–5.1)
Sodium: 141 mmol/L (ref 135–145)

## 2016-04-07 LAB — TROPONIN I: Troponin I: <0.03 ng/mL (ref <0.03)

## 2016-04-07 MED ORDER — POTASSIUM CHLORIDE 10 MEQ/100ML IV SOLN
10.0000 meq | INTRAVENOUS | Status: AC
  Administered 2016-04-07 (×4): 10 meq via INTRAVENOUS
  Filled 2016-04-07 (×4): qty 100

## 2016-04-07 MED ORDER — PROMETHAZINE HCL 12.5 MG PO TABS: 12.5000 mg | ORAL_TABLET | Freq: Four times a day (QID) | ORAL | 0 refills | Status: DC | PRN

## 2016-04-07 NOTE — Progress Notes (Deleted)
[facility] NAME: Kayla Curry    MR#:  [id number]  DATE OF BIRTH:  [birth year]  DATE OF ADMISSION:  04/06/2016 ADMITTING PHYSICIAN: Nicholes Mango, MD  DATE OF DISCHARGE: 04/07/2016  PRIMARY CARE PHYSICIAN: Lelon Huh, MD    ADMISSION DIAGNOSIS:  Hypokalemia [E87.6] Epigastric abdominal pain [R10.13] Chest pain, unspecified chest pain type [R07.9]  DISCHARGE DIAGNOSIS:  Active Problems:   Hypokalemia   Chest pain   SECONDARY DIAGNOSIS:   Past Medical History:  Diagnosis Date  . History of pelvic fracture   . History of supraventricular tachycardia    PER PT ON 02-28-2013 NO ISSUES SINCE ABLATION IN 2005  . 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 Mass of adrenal gland ([place])   . Pelvic pain   . S/P ablation of ventricular arrhythmia HX SVT  [place]--  LAST VISIT 2012 -- PT RELEASED FROM CARE ON PRN BASIS  . Urinary retention    Self cath TID and prn    HOSPITAL COURSE:   45 year old female with past medical history of SVT, pelvic fracture, adrenal mass, urinary retention, who presented to the hospital with vague chest pain also noted to have hypokalemia.  1. Chest pain-this was atypical and noncardiac in nature. Suspect to be musculoskeletal. -She was observed on telemetry and had 3 sets of cardiac markers checked which were negative. -She was seen by cardiology who recommended outpatient stress test.  2. Hypokalemia-suspected to be secondary to hyperaldosteronism. -Patient is hemodynamically stable with no acute EKG changes and not hypertensive. She cannot tolerate oral potassium. She was given multiple rounds of IV potassium and this has improved and she will be discharged home with follow-up potassium as an outpatient. -She was seen by nephrology and they recommend outpatient workup for her possible  hyperaldosteronism. -We will continue Aldactone.  3. Hypothyroidism-she will resume her Synthroid.  4. GERD-she will resume her Nexium.  DISCHARGE CONDITIONS:   Stable  CONSULTS OBTAINED:  Treatment Team:  Nicholes Mango, MD Dionisio David, MD Raven Jarome Matin, MD  DRUG ALLERGIES:   Allergies  Allergen Reactions  . Advair Diskus [Fluticasone-Salmeterol] Shortness Of Breath  . Albuterol Sulfate Shortness Of Breath  . Atrovent Shortness Of Breath  . Bethanechol Shortness Of Breath and Other (See Comments)    RESPIRATORY DISTRESS  . Budesonide-Formoterol Fumarate Shortness Of Breath  . Influenza Virus Vaccine Split Anaphylaxis  . Levofloxacin In D5w Rash  . Lorazepam Other (See Comments)    Other Reaction: OTHER REACTION  . Xopenex [Levalbuterol] Shortness Of Breath  . Compazine Other (See Comments)    DYSTONIC  . Fluoride Preparations   . Lorazepam Other (See Comments)    "HAS OPPOSITE EFFECT"  . Levofloxacin Rash  . Potassium-Containing Compounds Nausea And Vomiting    Just the po potassium  No problem with iv  . Zofran [Ondansetron Hcl] Rash    DISCHARGE MEDICATIONS:     Medication List    TAKE these medications   ALDACTONE 100 MG tablet Generic drug:  spironolactone Take 100 mg by mouth 2 (two) times daily.   baclofen 20 MG tablet Commonly known as:  LIORESAL TAKE 1 TABLET (20 MG TOTAL) BY MOUTH 4 (FOUR) TIMES DAILY.   esomeprazole 40 MG capsule Commonly known as:  NEXIUM Take 1 capsule (40 mg total) by mouth daily before breakfast.  gabapentin 600 MG tablet Commonly known as:  NEURONTIN Take 1 tablet (600 mg total) by mouth 3 (three) times daily.   metaproterenol 20 MG tablet Commonly known as:  ALUPENT Take 1 tablet (20 mg total) by mouth 3 (three) times daily as needed.   oxyCODONE-acetaminophen 7.5-325 MG tablet Commonly known as:  PERCOCET One tablet every 4-5 hours as needed What changed:  how much to take  how to take this  when to take  this  reasons to take this  additional instructions   promethazine 12.5 MG tablet Commonly known as:  PHENERGAN Take 1 tablet (12.5 mg total) by mouth every 6 (six) hours as needed for nausea.   SYNTHROID 75 MCG tablet Generic drug:  levothyroxine TAKE 1 TABLET BY MOUTH DAILY   tiZANidine 4 MG tablet Commonly known as:  ZANAFLEX TAKE 1 TABLET (4 MG TOTAL) BY MOUTH 2 (TWO) TIMES DAILY.         DISCHARGE INSTRUCTIONS:   DIET:  Regular diet  DISCHARGE CONDITION:  Stable  ACTIVITY:  Activity as tolerated  OXYGEN:  Home Oxygen: No.   Oxygen Delivery: room air  DISCHARGE LOCATION:  home   If you experience worsening of your admission symptoms, develop shortness of breath, life threatening emergency, suicidal or homicidal thoughts you must seek medical attention immediately by calling 911 or calling your MD immediately  if symptoms less severe.  You Must read complete instructions/literature along with all the possible adverse reactions/side effects for all the Medicines you take and that have been prescribed to you. Take any new Medicines after you have completely understood and accpet all the possible adverse reactions/side effects.   Please note  You were cared for by a hospitalist during your hospital stay. If you have any questions about your discharge medications or the care you received while you were in the hospital after you are discharged, you can call the unit and asked to speak with the hospitalist on call if the hospitalist that took care of you is not available. Once you are discharged, your primary care physician will handle any further medical issues. Please note that NO REFILLS for any discharge medications will be authorized once you are discharged, as it is imperative that you return to your primary care physician (or establish a relationship with a primary care physician if you do not have one) for your aftercare needs so that they can reassess your need  for medications and monitor your lab values.     Today   No further chest pain. No nausea, vomiting. No other associated symptoms presently.  VITAL SIGNS:  Blood pressure 124/62, pulse 77, temperature 98.3 F (36.8 C), temperature source Oral, resp. rate 18, height 5\' 2"  (1.575 m), weight 46.4 kg (102 lb 4.8 oz), SpO2 98 %.  I/O:   Intake/Output Summary (Last 24 hours) at 04/07/16 1500 Last data filed at 04/07/16 1400  Gross per 24 hour  Intake          2236.25 ml  Output             1950 ml  Net           286.25 ml    PHYSICAL EXAMINATION:  GENERAL:  45 y.o.-year-old thin patient lying in the bed in no acute distress.  EYES: Pupils equal, round, reactive to light and accommodation. No scleral icterus. Extraocular muscles intact.  HEENT: Head atraumatic, normocephalic. Oropharynx and nasopharynx clear.  NECK:  Supple, no jugular venous distention. No thyroid  enlargement, no tenderness.  LUNGS: Normal breath sounds bilaterally, no wheezing, rales,rhonchi. No use of accessory muscles of respiration.  CARDIOVASCULAR: S1, S2 normal. No murmurs, rubs, or gallops.  ABDOMEN: Soft, non-tender, non-distended. Bowel sounds present. No organomegaly or mass.  EXTREMITIES: No pedal edema, cyanosis, or clubbing.  NEUROLOGIC: Cranial nerves II through XII are intact. No focal motor or sensory defecits b/l.  PSYCHIATRIC: The patient is alert and oriented x 3. Good affect.  SKIN: No obvious rash, lesion, or ulcer.   DATA REVIEW:   CBC  Recent Labs Lab 04/07/16 0507  WBC 5.5  HGB 12.5  HCT 36.5  PLT 169    Chemistries   Recent Labs Lab 04/06/16 1036  04/07/16 0507  NA 139  --  141  K 2.1*  < > 2.5*  CL 98*  --  107  CO2 29  --  30  GLUCOSE 98  --  90  BUN 8  --  11  CREATININE 0.67  --  0.56  CALCIUM 10.5*  --  8.1*  MG 1.8  --   --   < > = values in this interval not displayed.  Cardiac Enzymes  Recent Labs Lab 04/07/16 0507  TROPONINI <0.03    Microbiology  Results  No results found for this or any previous visit.  RADIOLOGY:  Dg Chest 2 View  Result Date: 04/06/2016 CLINICAL DATA:  Chest pain EXAM: CHEST  2 VIEW COMPARISON:  11/30/2014 FINDINGS: The heart size and mediastinal contours are within normal limits. Both lungs are clear. The visualized skeletal structures are unremarkable. IMPRESSION: No active cardiopulmonary disease. Electronically Signed   By: Franchot Gallo M.D.   On: 04/06/2016 11:34      Management plans discussed with the patient, family and they are in agreement.  CODE STATUS:     Code Status Orders        Start     Ordered   04/06/16 1650  Full code  Continuous     04/06/16 1649    Code Status History    Date Active Date Inactive Code Status Order ID Comments User Context   04/06/2016  4:49 PM 04/07/2016  7:59 AM Full Code [id number]  Nicholes Mango, MD Inpatient      TOTAL TIME TAKING CARE OF THIS PATIENT: 40 minutes.    Henreitta Leber M.D on 04/07/2016 at 3:00 PM  Between 7am to 6pm - Pager - [phone]  After 6pm go to [email or web address] - Proofreader  [facility]  Office  [phone]  CC: Primary care physician; Lelon Huh, MD

## 2016-04-07 NOTE — Telephone Encounter (Signed)
Please advise. Bridget  Drozdowski, CMA  

## 2016-04-07 NOTE — Telephone Encounter (Signed)
there is a 4:15 slot open Monday afternoon, or she can have the 9:45 same day slot Tuesday morning.

## 2016-04-07 NOTE — Care Management (Signed)
No discharge needs identified by members of care team 

## 2016-04-07 NOTE — Progress Notes (Signed)
Kayla Curry is a 45 y.o. female  [id number]  Primary Cardiologist: Neoma Laming Reason for Consultation: Chest pain and hypokalemia  HPI: This is a 45 year old pleasant white female with a past medical history of hypokalemia is usually seen at [facility] nephrology for adrenal mass presented to the hospital with chest pain and chest pain was described as pressure type associated with shortness of breath and diaphoresis. She still has pressure type chest pain occasionally. F2   Review of Systems: No orthopnea PND or leg swelling   Past Medical History:  Diagnosis Date  . History of pelvic fracture   . History of supraventricular tachycardia    PER PT ON 02-28-2013 NO ISSUES SINCE ABLATION IN 2005  . 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 Mass of adrenal gland ([place])   . Pelvic pain   . S/P ablation of ventricular arrhythmia HX SVT  [place]--  LAST VISIT 2012 -- PT RELEASED FROM CARE ON PRN BASIS  . Urinary retention    Self cath TID and prn    Medications Prior to Admission  Medication Sig Dispense Refill  . baclofen (LIORESAL) 20 MG tablet TAKE 1 TABLET (20 MG TOTAL) BY MOUTH 4 (FOUR) TIMES DAILY. 120 tablet 5  . esomeprazole (NEXIUM) 40 MG capsule Take 1 capsule (40 mg total) by mouth daily before breakfast. 30 capsule 11  . gabapentin (NEURONTIN) 600 MG tablet Take 1 tablet (600 mg total) by mouth 3 (three) times daily. 90 tablet 6  . metaproterenol (ALUPENT) 20 MG tablet Take 1 tablet (20 mg total) by mouth 3 (three) times daily as needed. 60 tablet 3  . oxyCODONE-acetaminophen (PERCOCET) 7.5-325 MG tablet One tablet every 4-5 hours as needed (Patient taking differently: Take 1 tablet by mouth every 6 (six) hours as needed. One tablet every 4-5 hours as needed) 120 tablet 0  . spironolactone (ALDACTONE) 100 MG tablet Take 100 mg by mouth 2 (two) times daily.    Marland Kitchen SYNTHROID  75 MCG tablet TAKE 1 TABLET BY MOUTH DAILY 30 tablet 50  . tiZANidine (ZANAFLEX) 4 MG tablet TAKE 1 TABLET (4 MG TOTAL) BY MOUTH 2 (TWO) TIMES DAILY. 60 tablet 5     . aspirin EC  325 mg Oral Daily  . atorvastatin  10 mg Oral q1800  . baclofen  20 mg Oral QID  . docusate sodium  100 mg Oral BID  . enoxaparin (LOVENOX) injection  40 mg Subcutaneous Q24H  . gabapentin  600 mg Oral TID  . levothyroxine  75 mcg Oral QAC breakfast  . pantoprazole  40 mg Oral Daily  . sodium chloride flush  3 mL Intravenous Q12H  . spironolactone  100 mg Oral BID    Infusions: . 0.9 % NaCl with KCl 20 mEq / L 75 mL/hr at 04/07/16 0758    Allergies  Allergen Reactions  . Advair Diskus [Fluticasone-Salmeterol] Shortness Of Breath  . Albuterol Sulfate Shortness Of Breath  . Atrovent Shortness Of Breath  . Bethanechol Shortness Of Breath and Other (See Comments)    RESPIRATORY DISTRESS  . Budesonide-Formoterol Fumarate Shortness Of Breath  . Influenza Virus Vaccine Split Anaphylaxis  . Levofloxacin In D5w Rash  . Lorazepam Other (See Comments)    Other Reaction: OTHER REACTION  . Xopenex [Levalbuterol] Shortness Of Breath  . Compazine Other (See Comments)    DYSTONIC  .  Fluoride Preparations   . Lorazepam Other (See Comments)    "HAS OPPOSITE EFFECT"  . Levofloxacin Rash  . Potassium-Containing Compounds Nausea And Vomiting    Just the po potassium  No problem with iv  . Zofran [Ondansetron Hcl] Rash    Social History   Social History  . Marital status: Married    Spouse name: N/A  . Number of children: 2  . Years of education: N/A   Occupational History  . Nurse [employer]  .  [place] History Main Topics  . Smoking status: Never Smoker  . Smokeless tobacco: Never Used  . Alcohol use No  . Drug use: No  . Sexual activity: Not on file   Other Topics Concern  . Not on file   Social History Narrative  . No narrative on file    Family History   Problem Relation Age of Onset  . Hypertension Father   . Colon cancer Neg Hx     PHYSICAL EXAM: Vitals:   04/07/16 0403 04/07/16 0759  BP: (!) 102/59 118/65  Pulse: 63 (!) 58  Resp: 18 16  Temp: 98.3 F (36.8 C) 98.2 F (36.8 C)     Intake/Output Summary (Last 24 hours) at 04/07/16 0810 Last data filed at 04/07/16 0404  Gross per 24 hour  Intake                0 ml  Output              500 ml  Net             -500 ml    General:  Well appearing. No respiratory difficulty HEENT: normal Neck: supple. no JVD. Carotids 2+ bilat; no bruits. No lymphadenopathy or thryomegaly appreciated. Cor: PMI nondisplaced. Regular rate & rhythm. No rubs, gallops or murmurs. Lungs: clear Abdomen: soft, nontender, nondistended. No hepatosplenomegaly. No bruits or masses. Good bowel sounds. Extremities: no cyanosis, clubbing, rash, edema Neuro: alert & oriented x 3, cranial nerves grossly intact. moves all 4 extremities w/o difficulty. Affect pleasant.  [id number] sinus rhythm nonspecific ST-T changes  Results for orders placed or performed during the hospital encounter of 04/06/16 (from the past 24 hour(s))  Basic metabolic panel     Status: Abnormal   Collection Time: 04/06/16 10:36 AM  Result Value Ref Range   Sodium 139 135 - 145 mmol/L   Potassium 2.1 (LL) 3.5 - 5.1 mmol/L   Chloride 98 (L) 101 - 111 mmol/L   CO2 29 22 - 32 mmol/L   Glucose, Bld 98 65 - 99 mg/dL   BUN 8 6 - 20 mg/dL   Creatinine, Ser 0.67 0.44 - 1.00 mg/dL   Calcium 10.5 (H) 8.9 - 10.3 mg/dL   GFR calc non Af Amer >60 >60 mL/min   GFR calc Af Amer >60 >60 mL/min   Anion gap 12 5 - 15  CBC     Status: None   Collection Time: 04/06/16 10:36 AM  Result Value Ref Range   WBC 9.4 3.6 - 11.0 K/uL   RBC 4.88 3.80 - 5.20 MIL/uL   Hemoglobin 15.3 12.0 - 16.0 g/dL   HCT 43.6 35.0 - 47.0 %   MCV 89.3 80.0 - 100.0 fL   MCH 31.3 26.0 - 34.0 pg   MCHC 35.1 32.0 - 36.0 g/dL   RDW 12.7 11.5 - 14.5 %   Platelets 236 150  - 440 K/uL  Troponin I  Status: None   Collection Time: 04/06/16 10:36 AM  Result Value Ref Range   Troponin I <0.03 <0.03 ng/mL  TSH     Status: None   Collection Time: 04/06/16 10:36 AM  Result Value Ref Range   TSH 2.304 0.350 - 4.500 uIU/mL  Magnesium     Status: None   Collection Time: 04/06/16 10:36 AM  Result Value Ref Range   Magnesium 1.8 1.7 - 2.4 mg/dL  Potassium     Status: Abnormal   Collection Time: 04/06/16  4:49 PM  Result Value Ref Range   Potassium 2.5 (LL) 3.5 - 5.1 mmol/L  Troponin I     Status: None   Collection Time: 04/06/16  4:49 PM  Result Value Ref Range   Troponin I <0.03 <0.03 ng/mL  Troponin I     Status: None   Collection Time: 04/06/16 10:45 PM  Result Value Ref Range   Troponin I <0.03 <0.03 ng/mL  Troponin I     Status: None   Collection Time: 04/07/16  5:07 AM  Result Value Ref Range   Troponin I <0.03 <0.03 ng/mL  Basic metabolic panel     Status: Abnormal   Collection Time: 04/07/16  5:07 AM  Result Value Ref Range   Sodium 141 135 - 145 mmol/L   Potassium 2.5 (LL) 3.5 - 5.1 mmol/L   Chloride 107 101 - 111 mmol/L   CO2 30 22 - 32 mmol/L   Glucose, Bld 90 65 - 99 mg/dL   BUN 11 6 - 20 mg/dL   Creatinine, Ser 0.56 0.44 - 1.00 mg/dL   Calcium 8.1 (L) 8.9 - 10.3 mg/dL   GFR calc non Af Amer >60 >60 mL/min   GFR calc Af Amer >60 >60 mL/min   Anion gap 4 (L) 5 - 15  CBC     Status: None   Collection Time: 04/07/16  5:07 AM  Result Value Ref Range   WBC 5.5 3.6 - 11.0 K/uL   RBC 4.00 3.80 - 5.20 MIL/uL   Hemoglobin 12.5 12.0 - 16.0 g/dL   HCT 36.5 35.0 - 47.0 %   MCV 91.1 80.0 - 100.0 fL   MCH 31.2 26.0 - 34.0 pg   MCHC 34.3 32.0 - 36.0 g/dL   RDW 12.9 11.5 - 14.5 %   Platelets 169 150 - 440 K/uL  Lipid panel     Status: None   Collection Time: 04/07/16  5:07 AM  Result Value Ref Range   Cholesterol 138 0 - 200 mg/dL   Triglycerides 99 <150 mg/dL   HDL 60 >40 mg/dL   Total CHOL/HDL Ratio 2.3 RATIO   VLDL 20 0 - 40 mg/dL    LDL Cholesterol 58 0 - 99 mg/dL   Dg Chest 2 View  Result Date: 04/06/2016 CLINICAL DATA:  Chest pain EXAM: CHEST  2 VIEW COMPARISON:  11/30/2014 FINDINGS: The heart size and mediastinal contours are within normal limits. Both lungs are clear. The visualized skeletal structures are unremarkable. IMPRESSION: No active cardiopulmonary disease. Electronically Signed   By: Franchot Gallo M.D.   On: 04/06/2016 11:34     ASSESSMENT AND PLAN:Atypical chest pain with myocardial infarction ruled out and EKG unremarkable. Patient is low-risk thus after hypokalemia is corrected patient can go home with follow-up in the office on Tuesday at 9 AM. Patient will then have outpatient stress test scheduled.  Aladdin Kollmann A

## 2016-04-07 NOTE — Telephone Encounter (Signed)
Erica from [facility] called to schedule a one week hospital follow-up for Hypokalemia.  Where can I schedule her?  Please call [phone] for appointment time.    Thanks,    -Mickel Baas

## 2016-04-07 NOTE — Progress Notes (Signed)
Discharge instructions given to patient along with paper prescription. Patient verbalized understanding. IV and tele removed. Patient in no distress at this time. Husband at bedside and will be transporting patient home.

## 2016-04-10 NOTE — Telephone Encounter (Signed)
Called pt to schedule hospital follow up. Pt stated that she was advised she should just have her Potassium rechecked on Wednesday by Dr. Caryn Section b/c the hospital scheduled her appts with other offices. Pt stated she would like to get the lab done and if she needs to follow up she would like to come in next week. Can she come in next week instead?  Please advise. Thanks TNP

## 2016-04-11 NOTE — Discharge Summary (Signed)
[facility] NAME: Kayla Curry    MR#:  [id number]  DATE OF BIRTH:  12-Mar-1971  DATE OF ADMISSION:  04/06/2016 ADMITTING PHYSICIAN: Nicholes Mango, MD  DATE OF DISCHARGE: 04/07/2016  3:27 PM  PRIMARY CARE PHYSICIAN: Lelon Huh, MD    ADMISSION DIAGNOSIS:  Hypokalemia [E87.6] Epigastric abdominal pain [R10.13] Chest pain, unspecified chest pain type [R07.9]  DISCHARGE DIAGNOSIS:  Active Problems:   Hypokalemia   Chest pain   SECONDARY DIAGNOSIS:   Past Medical History:  Diagnosis Date  . History of pelvic fracture   . History of supraventricular tachycardia    PER PT ON 02-28-2013 NO ISSUES SINCE ABLATION IN 2005  . 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 Mass of adrenal gland ([place])   . Pelvic pain   . S/P ablation of ventricular arrhythmia HX SVT  [place]--  LAST VISIT 2012 -- PT RELEASED FROM CARE ON PRN BASIS  . Urinary retention    Self cath TID and prn    HOSPITAL COURSE:   45 year old female with past medical history of SVT, pelvic fracture, adrenal mass, urinary retention, who presented to the hospital with vague chest pain also noted to have hypokalemia.  1. Chest pain-this was atypical and noncardiac in nature. Suspect to be musculoskeletal. -She was observed on telemetry and had 3 sets of cardiac markers checked which were negative. -She was seen by cardiology who recommended outpatient stress test.  2. Hypokalemia-suspected to be secondary to hyperaldosteronism. -Patient is hemodynamically stable with no acute EKG changes and not hypertensive. She cannot tolerate oral potassium. She was given multiple rounds of IV potassium and this has improved and she will be discharged home with follow-up potassium as an outpatient. -She was seen by nephrology and they recommend outpatient workup for her possible  hyperaldosteronism. -We will continue Aldactone.  3. Hypothyroidism-she will resume her Synthroid.  4. GERD-she will resume her Nexium.   DISCHARGE CONDITIONS:   Stable  CONSULTS OBTAINED:  Treatment Team:  Nicholes Mango, MD Dionisio David, MD Raven Jarome Matin, MD  DRUG ALLERGIES:   Allergies  Allergen Reactions  . Advair Diskus [Fluticasone-Salmeterol] Shortness Of Breath  . Albuterol Sulfate Shortness Of Breath  . Atrovent Shortness Of Breath  . Bethanechol Shortness Of Breath and Other (See Comments)    RESPIRATORY DISTRESS  . Budesonide-Formoterol Fumarate Shortness Of Breath  . Influenza Virus Vaccine Split Anaphylaxis  . Levofloxacin In D5w Rash  . Lorazepam Other (See Comments)    Other Reaction: OTHER REACTION  . Xopenex [Levalbuterol] Shortness Of Breath  . Compazine Other (See Comments)    DYSTONIC  . Fluoride Preparations   . Lorazepam Other (See Comments)    "HAS OPPOSITE EFFECT"  . Levofloxacin Rash  . Potassium-Containing Compounds Nausea And Vomiting    Just the po potassium  No problem with iv  . Zofran [Ondansetron Hcl] Rash    DISCHARGE MEDICATIONS:     Medication List    TAKE these medications   ALDACTONE 100 MG tablet Generic drug:  spironolactone Take 100 mg by mouth 2 (two) times daily.   baclofen 20 MG tablet Commonly known as:  LIORESAL TAKE 1 TABLET (20 MG TOTAL) BY MOUTH 4 (FOUR) TIMES DAILY.   esomeprazole 40 MG capsule Commonly known as:  NEXIUM Take 1 capsule (40 mg total) by  mouth daily before breakfast.   gabapentin 600 MG tablet Commonly known as:  NEURONTIN Take 1 tablet (600 mg total) by mouth 3 (three) times daily.   metaproterenol 20 MG tablet Commonly known as:  ALUPENT Take 1 tablet (20 mg total) by mouth 3 (three) times daily as needed.   oxyCODONE-acetaminophen 7.5-325 MG tablet Commonly known as:  PERCOCET One tablet every 4-5 hours as needed What changed:  how much to take  how to take this  when to  take this  reasons to take this  additional instructions   promethazine 12.5 MG tablet Commonly known as:  PHENERGAN Take 1 tablet (12.5 mg total) by mouth every 6 (six) hours as needed for nausea.   SYNTHROID 75 MCG tablet Generic drug:  levothyroxine TAKE 1 TABLET BY MOUTH DAILY   tiZANidine 4 MG tablet Commonly known as:  ZANAFLEX TAKE 1 TABLET (4 MG TOTAL) BY MOUTH 2 (TWO) TIMES DAILY.         DISCHARGE INSTRUCTIONS:   DIET:  Regular diet  DISCHARGE CONDITION:  Stable  ACTIVITY:  Activity as tolerated  OXYGEN:  Home Oxygen: No.   Oxygen Delivery: room air  DISCHARGE LOCATION:  home   If you experience worsening of your admission symptoms, develop shortness of breath, life threatening emergency, suicidal or homicidal thoughts you must seek medical attention immediately by calling 911 or calling your MD immediately  if symptoms less severe.  You Must read complete instructions/literature along with all the possible adverse reactions/side effects for all the Medicines you take and that have been prescribed to you. Take any new Medicines after you have completely understood and accpet all the possible adverse reactions/side effects.   Please note  You were cared for by a hospitalist during your hospital stay. If you have any questions about your discharge medications or the care you received while you were in the hospital after you are discharged, you can call the unit and asked to speak with the hospitalist on call if the hospitalist that took care of you is not available. Once you are discharged, your primary care physician will handle any further medical issues. Please note that NO REFILLS for any discharge medications will be authorized once you are discharged, as it is imperative that you return to your primary care physician (or establish a relationship with a primary care physician if you do not have one) for your aftercare needs so that they can reassess your  need for medications and monitor your lab values.     Today   Complaining of some nausea.   Potassium level improved.   VITAL SIGNS:  Blood pressure 124/62, pulse 77, temperature 98.3 F (36.8 C), temperature source Oral, resp. rate 18, height 5\' 2"  (1.575 m), weight 46.4 kg (102 lb 4.8 oz), SpO2 98 %.  I/O:  No intake or output data in the 24 hours ending 04/11/16 1021  PHYSICAL EXAMINATION:  GENERAL:  45 y.o.-year-old patient lying in the bed with no acute distress.  EYES: Pupils equal, round, reactive to light and accommodation. No scleral icterus. Extraocular muscles intact.  HEENT: Head atraumatic, normocephalic. Oropharynx and nasopharynx clear.  NECK:  Supple, no jugular venous distention. No thyroid enlargement, no tenderness.  LUNGS: Normal breath sounds bilaterally, no wheezing, rales,rhonchi. No use of accessory muscles of respiration.  CARDIOVASCULAR: S1, S2 normal. No murmurs, rubs, or gallops.  ABDOMEN: Soft, non-tender, non-distended. Bowel sounds present. No organomegaly or mass.  EXTREMITIES: No pedal edema, cyanosis, or clubbing.  NEUROLOGIC: Cranial nerves II through XII are intact. No focal motor or sensory defecits b/l.  PSYCHIATRIC: The patient is alert and oriented x 3. Good affect.  SKIN: No obvious rash, lesion, or ulcer.   DATA REVIEW:   CBC  Recent Labs Lab 04/07/16 0507  WBC 5.5  HGB 12.5  HCT 36.5  PLT 169    Chemistries   Recent Labs Lab 04/06/16 1036  04/07/16 0507  NA 139  --  141  K 2.1*  < > 2.5*  CL 98*  --  107  CO2 29  --  30  GLUCOSE 98  --  90  BUN 8  --  11  CREATININE 0.67  --  0.56  CALCIUM 10.5*  --  8.1*  MG 1.8  --   --   < > = values in this interval not displayed.  Cardiac Enzymes  Recent Labs Lab 04/07/16 0507  TROPONINI <0.03    Microbiology Results  No results found for this or any previous visit.  RADIOLOGY:  No results found.    Management plans discussed with the patient, family and they  are in agreement.  CODE STATUS:  Code Status History    Date Active Date Inactive Code Status Order ID Comments User Context   04/06/2016  4:49 PM 04/07/2016  7:59 AM Full Code [id number]  Nicholes Mango, MD Inpatient      TOTAL TIME TAKING CARE OF THIS PATIENT: 40 minutes.    Henreitta Leber M.D on 04/11/2016 at 10:21 AM  Between 7am to 6pm - Pager - [phone]  After 6pm go to [email or web address] - Proofreader  [facility]  Office  [phone]  CC: Primary care physician; Lelon Huh, MD

## 2016-04-11 NOTE — Telephone Encounter (Signed)
Order printed

## 2016-04-13 ENCOUNTER — Ambulatory Visit (INDEPENDENT_AMBULATORY_CARE_PROVIDER_SITE_OTHER): Payer: Managed Care, Other (non HMO) | Admitting: Gastroenterology

## 2016-04-13 ENCOUNTER — Encounter: Payer: Self-pay | Admitting: Gastroenterology

## 2016-04-13 ENCOUNTER — Other Ambulatory Visit: Payer: Self-pay

## 2016-04-13 VITALS — BP 143/69 | HR 80 | Temp 98.6°F | Ht 62.0 in | Wt 107.0 lb

## 2016-04-13 DIAGNOSIS — R1013 Epigastric pain: Secondary | ICD-10-CM | POA: Diagnosis not present

## 2016-04-13 DIAGNOSIS — G8929 Other chronic pain: Secondary | ICD-10-CM

## 2016-04-13 LAB — RENAL FUNCTION PANEL
Albumin: 4.4 g/dL (ref 3.5–5.5)
BUN / CREAT RATIO: 10 (ref 9–23)
BUN: 6 mg/dL (ref 6–24)
CO2: 21 mmol/L (ref 18–29)
Calcium: 9.6 mg/dL (ref 8.7–10.2)
Chloride: 102 mmol/L (ref 96–106)
Creatinine, Ser: 0.58 mg/dL (ref 0.57–1.00)
GFR, EST AFRICAN AMERICAN: 130 mL/min/{1.73_m2} (ref >59)
GFR, EST NON AFRICAN AMERICAN: 112 mL/min/{1.73_m2} (ref >59)
GLUCOSE: 77 mg/dL (ref 65–99)
PHOSPHORUS: 3.2 mg/dL (ref 2.5–4.5)
POTASSIUM: 3.2 mmol/L — AB (ref 3.5–5.2)
SODIUM: 143 mmol/L (ref 134–144)

## 2016-04-13 NOTE — Progress Notes (Signed)
Primary Care Physician: Lelon Huh, MD  Primary Gastroenterologist:  Dr. Lucilla Lame  Chief Complaint  Patient presents with  . Abdominal Pain    HPI: Kayla Curry is a 45 y.o. female here for follow-up of her abdominal pain.  The patient had been seen in the past with symptoms consistent with musculoskeletal pain but at that time the patient wasn't having pain.  The patient now comes in a reports that she has not been eating due to the pain.  She also states that the pain causes her to have nausea and vomiting.  The patient was recently in the hospital with decreased potassium. She is presently on baclofen for chronic back pain.  As in the past she denies that food makes her symptoms any better or worse.  Current Outpatient Prescriptions  Medication Sig Dispense Refill  . baclofen (LIORESAL) 20 MG tablet TAKE 1 TABLET (20 MG TOTAL) BY MOUTH 4 (FOUR) TIMES DAILY. 120 tablet 5  . EPINEPHrine (EPIPEN 2-PAK) 0.3 mg/0.3 mL IJ SOAJ injection Inject into the muscle.    . esomeprazole (NEXIUM) 40 MG capsule Take 1 capsule (40 mg total) by mouth daily before breakfast. 30 capsule 11  . gabapentin (NEURONTIN) 600 MG tablet Take 1 tablet (600 mg total) by mouth 3 (three) times daily. 90 tablet 6  . metaproterenol (ALUPENT) 20 MG tablet Take 1 tablet (20 mg total) by mouth 3 (three) times daily as needed. 60 tablet 3  . oxyCODONE-acetaminophen (PERCOCET) 7.5-325 MG tablet One tablet every 4-5 hours as needed (Patient taking differently: Take 1 tablet by mouth every 6 (six) hours as needed. One tablet every 4-5 hours as needed) 120 tablet 0  . Potassium 99 MG TABS Take by mouth 2 (two) times daily.    . promethazine (PHENERGAN) 12.5 MG tablet Take 1 tablet (12.5 mg total) by mouth every 6 (six) hours as needed for nausea. 30 tablet 0  . spironolactone (ALDACTONE) 100 MG tablet Take 100 mg by mouth 2 (two) times daily.    Marland Kitchen SYNTHROID 75 MCG tablet TAKE 1 TABLET BY MOUTH DAILY 30 tablet 50  .  tiZANidine (ZANAFLEX) 4 MG tablet TAKE 1 TABLET (4 MG TOTAL) BY MOUTH 2 (TWO) TIMES DAILY. 60 tablet 5   No current facility-administered medications for this visit.     Allergies as of 04/13/2016 - Review Complete 04/13/2016  Allergen Reaction Noted  . Advair diskus [fluticasone-salmeterol] Shortness Of Breath 10/05/2011  . Albuterol sulfate Shortness Of Breath 06/10/2015  . Atrovent Shortness Of Breath 10/05/2011  . Bethanechol Shortness Of Breath and Other (See Comments) 10/02/2011  . Budesonide-formoterol fumarate Shortness Of Breath 10/02/2011  . Influenza virus vaccine split Anaphylaxis 10/05/2011  . Levofloxacin in d5w Rash 06/21/2015  . Lorazepam Other (See Comments) 06/21/2015  . Xopenex [levalbuterol] Shortness Of Breath 10/02/2011  . Compazine Other (See Comments) 10/02/2011  . Fluoride preparations  10/05/2011  . Lorazepam Other (See Comments) 10/02/2011  . Levofloxacin Rash 10/02/2011  . Potassium-containing compounds Nausea And Vomiting 10/05/2011  . Zofran [ondansetron hcl] Rash 02/28/2013    ROS:  General: Negative for anorexia, weight loss, fever, chills, fatigue, weakness. ENT: Negative for hoarseness, difficulty swallowing , nasal congestion. CV: Negative for chest pain, angina, palpitations, dyspnea on exertion, peripheral edema.  Respiratory: Negative for dyspnea at rest, dyspnea on exertion, cough, sputum, wheezing.  GI: See history of present illness. GU:  Negative for dysuria, hematuria, urinary incontinence, urinary frequency, nocturnal urination.  Endo: Negative for unusual weight change.  Physical Examination:   BP (!) 143/69   Pulse 80   Temp 98.6 F (37 C) (Oral)   Ht 5\' 2"  (1.575 m)   Wt 107 lb (48.5 kg)   BMI 19.57 kg/m   General: Well-nourished, well-developed in no acute distress.  Eyes: No icterus. Conjunctivae pink. Mouth: Oropharyngeal mucosa moist and pink , no lesions erythema or exudate. Lungs: Clear to auscultation bilaterally.  Non-labored. Heart: Regular rate and rhythm, no murmurs rubs or gallops.  Abdomen: Bowel sounds are normal, Positive tenderness to one finger palpation while flexing the abdominal wall muscles, nondistended, no hepatosplenomegaly or masses, no abdominal bruits or hernia , no rebound or guarding.   Extremities: No lower extremity edema. No clubbing or deformities. Neuro: Alert and oriented x 3.  Grossly intact. Skin: Warm and dry, no jaundice.   Psych: Alert and cooperative, normal mood and affect.  Labs:    Imaging Studies: Dg Chest 2 View  Result Date: 04/06/2016 CLINICAL DATA:  Chest pain EXAM: CHEST  2 VIEW COMPARISON:  11/30/2014 FINDINGS: The heart size and mediastinal contours are within normal limits. Both lungs are clear. The visualized skeletal structures are unremarkable. IMPRESSION: No active cardiopulmonary disease. Electronically Signed   By: Franchot Gallo M.D.   On: 04/06/2016 11:34    Assessment and Plan:   Kayla Curry is a 45 y.o. y/o female Who comes in today with signs and symptoms consistent with musculoskeletal pain.  The pain is reproducible with one finger light palpation on the abdominal wall while raising the leg 6 inches above the exam table.  It has been told that since her pain causes her to have nausea and vomiting that is exacerbating her muscle strain.  The patient has also been told to take anti-inflammatory medication and warm compresses to the abdominal area.  She will also continue her muscle relaxer and avoid things that strain her abdomen.  The patient has been started on a trial of Dexilant to be taken with her anti-inflammatories so that she does not cause gastric irritation.  The patient has been explained the plan and agrees with it.   Note: This dictation was prepared with Dragon dictation along with smaller phrase technology. Any transcriptional errors that result from this process are unintentional.

## 2016-04-14 ENCOUNTER — Telehealth: Payer: Self-pay

## 2016-04-14 NOTE — Telephone Encounter (Signed)
Patient advised as below.  

## 2016-04-14 NOTE — Telephone Encounter (Signed)
-----   Message from [name], MD sent at 04/13/2016  2:24 PM EDT ----- Potassium level is up to 3.2. Continue current medications.

## 2016-04-19 ENCOUNTER — Other Ambulatory Visit: Payer: Self-pay | Admitting: Family Medicine

## 2016-04-19 MED ORDER — OXYCODONE-ACETAMINOPHEN 7.5-325 MG PO TABS: ORAL_TABLET | ORAL | 0 refills | Status: DC

## 2016-04-19 NOTE — Telephone Encounter (Signed)
Pt contacted office for refill request on the following medications: ° °oxyCODONE-acetaminophen (PERCOCET) 7.5-325 MG tablet  ° °CB#[phone]/MW °

## 2016-05-08 ENCOUNTER — Other Ambulatory Visit: Payer: Self-pay | Admitting: Family Medicine

## 2016-05-09 ENCOUNTER — Other Ambulatory Visit: Payer: Self-pay | Admitting: Family Medicine

## 2016-05-09 ENCOUNTER — Telehealth: Payer: Self-pay | Admitting: *Deleted

## 2016-05-09 MED ORDER — PROMETHAZINE HCL 25 MG PO TABS: 25.0000 mg | ORAL_TABLET | Freq: Four times a day (QID) | ORAL | 5 refills | Status: DC | PRN

## 2016-05-09 MED ORDER — PROMETHAZINE HCL 12.5 MG PO TABS: 12.5000 mg | ORAL_TABLET | Freq: Four times a day (QID) | ORAL | 2 refills | Status: DC | PRN

## 2016-05-09 NOTE — Telephone Encounter (Signed)
Patient is requesting refill for phenergan 12.5 mg. Patient has been taking phenergan bid instead of once daily due to increased vomiting when she takes her potassium. Please advise?

## 2016-05-09 NOTE — Progress Notes (Signed)
rx only

## 2016-05-10 ENCOUNTER — Other Ambulatory Visit: Payer: Self-pay | Admitting: Family Medicine

## 2016-05-10 MED ORDER — OXYCODONE-ACETAMINOPHEN 7.5-325 MG PO TABS: ORAL_TABLET | ORAL | 0 refills | Status: DC

## 2016-05-17 ENCOUNTER — Other Ambulatory Visit: Payer: Self-pay | Admitting: Family Medicine

## 2016-05-17 MED ORDER — OXYCODONE-ACETAMINOPHEN 7.5-325 MG PO TABS: ORAL_TABLET | ORAL | 0 refills | Status: DC

## 2016-05-17 NOTE — Telephone Encounter (Signed)
Ok--keep appt next month

## 2016-05-17 NOTE — Telephone Encounter (Signed)
Done-aa 

## 2016-05-17 NOTE — Telephone Encounter (Signed)
Please advise-aa 

## 2016-05-17 NOTE — Telephone Encounter (Signed)
Dr. Fisher's pt.   Thanks,   -Kayla Curry  

## 2016-05-17 NOTE — Telephone Encounter (Signed)
Pt  Needs refill on her oxycodone  Please call when ready to pick up    Kayla Curry

## 2016-05-23 ENCOUNTER — Telehealth: Payer: Self-pay | Admitting: Family Medicine

## 2016-05-23 DIAGNOSIS — E876 Hypokalemia: Secondary | ICD-10-CM

## 2016-05-23 DIAGNOSIS — E039 Hypothyroidism, unspecified: Secondary | ICD-10-CM

## 2016-05-23 NOTE — Telephone Encounter (Signed)
Pt states she has an appointment on Monday with Dr Caryn Section and is requesting to pick up a lab slip tomorrow to have her labs done before her appointment.  CB#[phone]/MW

## 2016-05-23 NOTE — Telephone Encounter (Signed)
Needs renal panel for hypokalemia and TSH for hypothyroid. Thanks.

## 2016-05-23 NOTE — Telephone Encounter (Signed)
Please advise 

## 2016-05-24 NOTE — Telephone Encounter (Signed)
Labs printed and pt was notified. 

## 2016-05-25 LAB — RENAL FUNCTION PANEL
ALBUMIN: 4.6 g/dL (ref 3.5–5.5)
BUN/Creatinine Ratio: 13 (ref 9–23)
BUN: 8 mg/dL (ref 6–24)
CALCIUM: 9.2 mg/dL (ref 8.7–10.2)
CHLORIDE: 99 mmol/L (ref 96–106)
CO2: 27 mmol/L (ref 18–29)
Creatinine, Ser: 0.63 mg/dL (ref 0.57–1.00)
GFR calc non Af Amer: 109 mL/min/{1.73_m2} (ref >59)
GFR, EST AFRICAN AMERICAN: 126 mL/min/{1.73_m2} (ref >59)
GLUCOSE: 89 mg/dL (ref 65–99)
PHOSPHORUS: 3.5 mg/dL (ref 2.5–4.5)
POTASSIUM: 2.7 mmol/L — AB (ref 3.5–5.2)
Sodium: 142 mmol/L (ref 134–144)

## 2016-05-25 LAB — TSH: TSH: 0.929 u[IU]/mL (ref 0.450–4.500)

## 2016-05-26 IMAGING — CR DG CHEST 2V
1 series · 2 of 2 positions shown · non-contrast
Comparison: June 24, 2010.

CLINICAL DATA: Cough.

EXAM:
CHEST  2 VIEW

[Series 1: kdxr chest pa (or ap) and lat · 0.14mm/px · 2 of 2 slices shown]
[im 1/2]
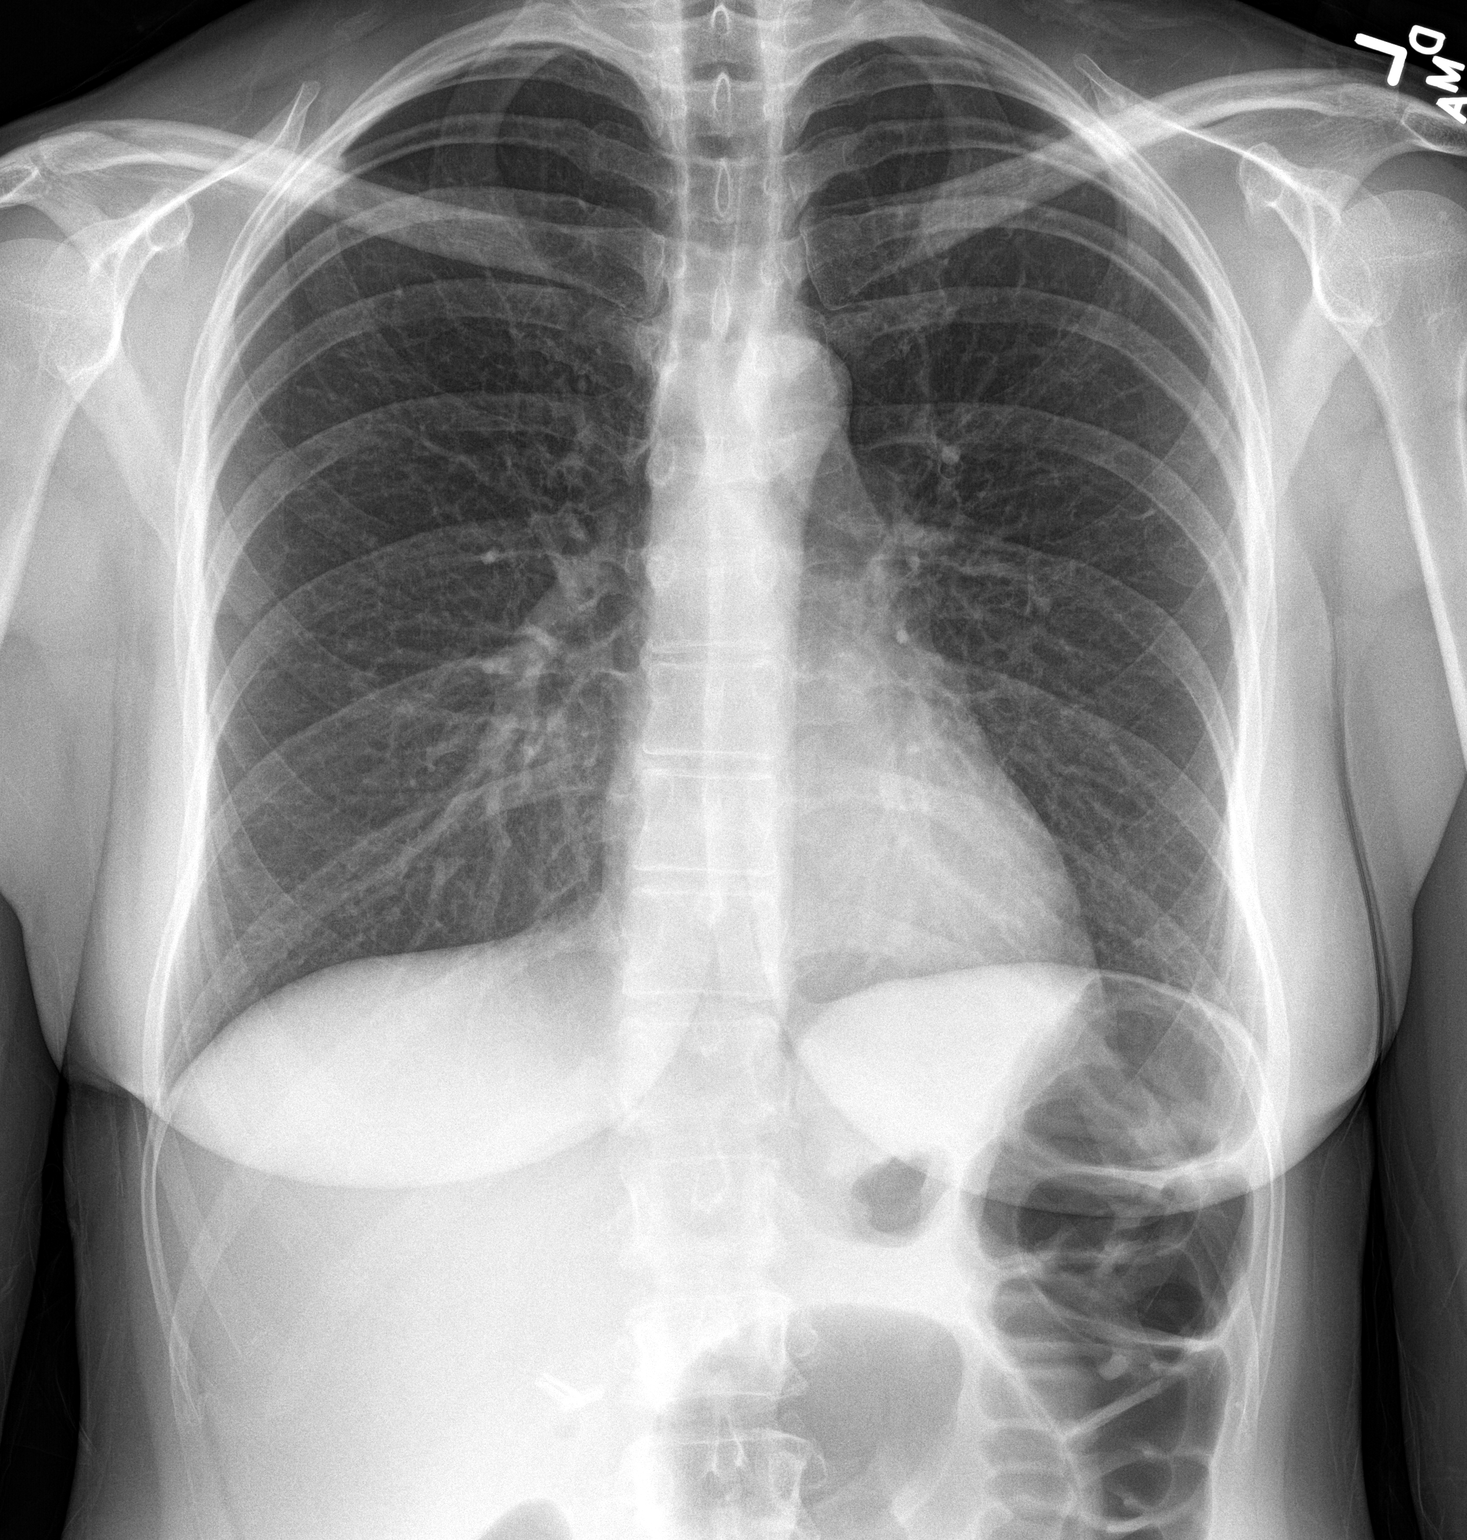
[im 2/2]
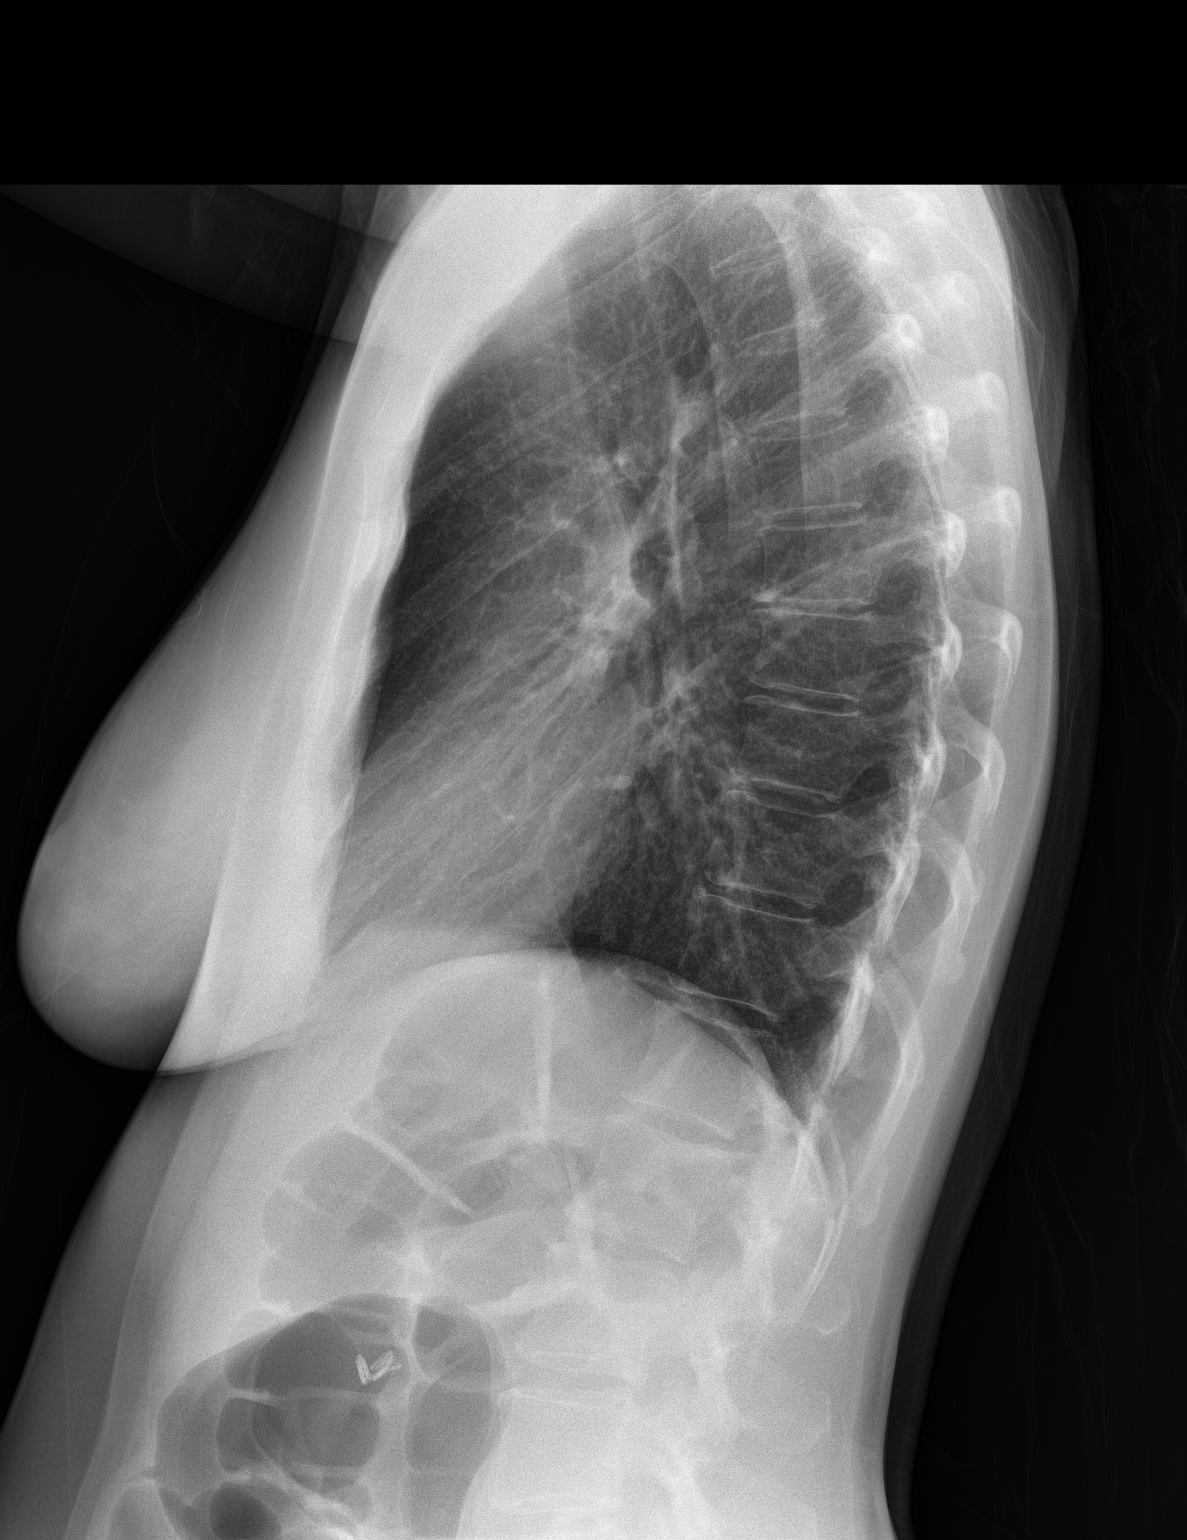

[2 of 2 positions shown; findings below may reference images not displayed]

FINDINGS: The heart size and mediastinal contours are within normal limits.
Both lungs are clear. No pneumothorax or pleural effusion is noted.
The visualized skeletal structures are unremarkable.
IMPRESSION: No active cardiopulmonary disease.

## 2016-05-29 ENCOUNTER — Encounter: Payer: Self-pay | Admitting: Family Medicine

## 2016-05-29 ENCOUNTER — Ambulatory Visit (INDEPENDENT_AMBULATORY_CARE_PROVIDER_SITE_OTHER): Payer: Managed Care, Other (non HMO) | Admitting: Family Medicine

## 2016-05-29 VITALS — BP 144/72 | HR 88 | Temp 99.4°F | Resp 16 | Wt 105.0 lb

## 2016-05-29 DIAGNOSIS — G894 Chronic pain syndrome: Secondary | ICD-10-CM | POA: Diagnosis not present

## 2016-05-29 DIAGNOSIS — E039 Hypothyroidism, unspecified: Secondary | ICD-10-CM

## 2016-05-29 DIAGNOSIS — E876 Hypokalemia: Secondary | ICD-10-CM

## 2016-05-29 NOTE — Progress Notes (Signed)
Patient: Kayla Curry Female    DOB: 1970-08-30   45 y.o.   MRN: [id number] Visit Date: 05/29/2016  Today's Provider: Lelon Huh, MD   Chief Complaint  Patient presents with  . Hypothyroidism   Subjective:    HPI Follow up of Hypothyroidism:  Patient was last seen for this problem 1 year ago. Labs were checked and no changes were made. Patient reports good compliance with treatment.  Lab Results  Component Value Date   TSH 0.929 05/24/2016    Follow up Hypokalemia:  Patient was last seen for this problem 1 year ago. Management during that visit includes ordering labs which showed potassium levels were low. Patient was advised to eat more potassium rich foods. Cannot tolerate prescription oral supplements. Had to get IV potassium when level was down to 2.5 in September. Rechecked last week and was 2.7. She is waiting to hear from nephrologist for follow up. Continues on 100mg  spironolactone daily.   Follow up of Back Pain:  Patient is currently taking Oxycodone- Acetaminophen for pain management. Patient reports good compliance with treatment, good tolerance and good symptom control. Is taking 2-3 times most days and remains effective.     Wt Readings from Last 3 Encounters:  05/29/16 105 lb (47.6 kg)  04/13/16 107 lb (48.5 kg)  04/06/16 102 lb 4.8 oz (46.4 kg)     Allergies  Allergen Reactions  . Advair Diskus [Fluticasone-Salmeterol] Shortness Of Breath  . Albuterol Sulfate Shortness Of Breath  . Atrovent Shortness Of Breath  . Bethanechol Shortness Of Breath and Other (See Comments)    RESPIRATORY DISTRESS  . Budesonide-Formoterol Fumarate Shortness Of Breath  . Influenza Virus Vaccine Split Anaphylaxis  . Levofloxacin In D5w Rash  . Lorazepam Other (See Comments)    Other Reaction: OTHER REACTION  . Xopenex [Levalbuterol] Shortness Of Breath  . Compazine Other (See Comments)    DYSTONIC  . Fluoride Preparations   . Lorazepam Other (See Comments)   "HAS OPPOSITE EFFECT"  . Levofloxacin Rash  . Potassium-Containing Compounds Nausea And Vomiting    Just the po potassium  No problem with iv  . Zofran [Ondansetron Hcl] Rash     Current Outpatient Prescriptions:  .  baclofen (LIORESAL) 20 MG tablet, TAKE 1 TABLET (20 MG TOTAL) BY MOUTH 4 (FOUR) TIMES DAILY., Disp: 120 tablet, Rfl: 5 .  EPINEPHrine (EPIPEN 2-PAK) 0.3 mg/0.3 mL IJ SOAJ injection, Inject into the muscle., Disp: , Rfl:  .  esomeprazole (NEXIUM) 40 MG capsule, Take 1 capsule (40 mg total) by mouth daily before breakfast., Disp: 30 capsule, Rfl: 11 .  gabapentin (NEURONTIN) 600 MG tablet, Take 1 tablet (600 mg total) by mouth 3 (three) times daily., Disp: 90 tablet, Rfl: 6 .  metaproterenol (ALUPENT) 20 MG tablet, Take 1 tablet (20 mg total) by mouth 3 (three) times daily as needed., Disp: 60 tablet, Rfl: 3 .  oxyCODONE-acetaminophen (PERCOCET) 7.5-325 MG tablet, One tablet every 4-5 hours as needed, Disp: 120 tablet, Rfl: 0 .  Potassium 99 MG TABS, Take by mouth 2 (two) times daily., Disp: , Rfl:  .  promethazine (PHENERGAN) 25 MG tablet, Take 1 tablet (25 mg total) by mouth every 6 (six) hours as needed for nausea., Disp: 30 tablet, Rfl: 5 .  spironolactone (ALDACTONE) 100 MG tablet, Take 100 mg by mouth 2 (two) times daily., Disp: , Rfl:  .  SYNTHROID 75 MCG tablet, TAKE 1 TABLET BY MOUTH DAILY, Disp: 30 tablet, Rfl:  50 .  tiZANidine (ZANAFLEX) 4 MG tablet, TAKE 1 TABLET (4 MG TOTAL) BY MOUTH 2 (TWO) TIMES DAILY., Disp: 60 tablet, Rfl: 5  Review of Systems  Constitutional: Positive for fatigue. Negative for appetite change, chills and fever.  Respiratory: Negative for chest tightness and shortness of breath.   Cardiovascular: Negative for chest pain and palpitations.  Gastrointestinal: Positive for abdominal pain. Negative for nausea and vomiting.  Musculoskeletal: Positive for back pain and myalgias.  Neurological: Negative for dizziness and weakness.    Social History    Substance Use Topics  . Smoking status: Never Smoker  . Smokeless tobacco: Never Used  . Alcohol use No   Objective:   BP (!) 144/72 (BP Location: Right Arm, Patient Position: Sitting, Cuff Size: Normal)   Pulse 88   Temp 99.4 F (37.4 C) (Oral)   Resp 16   Wt 105 lb (47.6 kg)   BMI 19.20 kg/m   Physical Exam   General Appearance:    Alert, cooperative, no distress  Eyes:    PERRL, conjunctiva/corneas clear, EOM's intact       Lungs:     Clear to auscultation bilaterally, respirations unlabored  Heart:    Regular rate and rhythm  Neurologic:   Awake, alert, oriented x 3. No apparent focal neurological           defect.           Assessment & Plan:     1. Hypokalemia Awaiting appointment with nephrology. Recheck potassium. Consider increasing spironolactone if she cannot get in with nephrology soon.  - Renal function panel  2. Hypothyroidism, unspecified type Well controlled.  Continue current medications.    3. Chronic pain associated with significant psychosocial dysfunction Adequately controlled on current medications.        Lelon Huh, MD  [facility]

## 2016-06-04 ENCOUNTER — Other Ambulatory Visit: Payer: Self-pay | Admitting: Family Medicine

## 2016-06-13 ENCOUNTER — Other Ambulatory Visit: Payer: Self-pay | Admitting: Family Medicine

## 2016-06-13 MED ORDER — OXYCODONE-ACETAMINOPHEN 7.5-325 MG PO TABS: ORAL_TABLET | ORAL | 0 refills | Status: DC

## 2016-06-13 NOTE — Telephone Encounter (Signed)
Pt needs refill on oxycodone 7.5/325   Call back is [phone]  Thanks Con Memos

## 2016-06-15 ENCOUNTER — Telehealth: Payer: Self-pay

## 2016-06-15 ENCOUNTER — Telehealth: Payer: Self-pay | Admitting: Family Medicine

## 2016-06-15 LAB — RENAL FUNCTION PANEL
ALBUMIN: 4.8 g/dL (ref 3.5–5.5)
BUN/Creatinine Ratio: 16 (ref 9–23)
BUN: 8 mg/dL (ref 6–24)
CALCIUM: 9.7 mg/dL (ref 8.7–10.2)
CHLORIDE: 99 mmol/L (ref 96–106)
CO2: 30 mmol/L — AB (ref 18–29)
Creatinine, Ser: 0.5 mg/dL — ABNORMAL LOW (ref 0.57–1.00)
GFR calc Af Amer: 136 mL/min/{1.73_m2} (ref >59)
GFR calc non Af Amer: 118 mL/min/{1.73_m2} (ref >59)
GLUCOSE: 82 mg/dL (ref 65–99)
PHOSPHORUS: 3.2 mg/dL (ref 2.5–4.5)
POTASSIUM: 3 mmol/L — AB (ref 3.5–5.2)
SODIUM: 145 mmol/L — AB (ref 134–144)

## 2016-06-15 NOTE — Telephone Encounter (Signed)
Pt is requesting a call back.  CB#[phone]/MW

## 2016-06-15 NOTE — Telephone Encounter (Signed)
Highest dose tablet is 100mg , she can start taking one tablet twice daily, OK to send in new rx for #60, rf x 3 if she needs it.

## 2016-06-15 NOTE — Telephone Encounter (Signed)
Please advise lab results.

## 2016-06-15 NOTE — Telephone Encounter (Signed)
-----   Message from [name], MD sent at 06/15/2016  2:02 PM EDT ----- Potassium a little better, up to 3.0

## 2016-06-15 NOTE — Telephone Encounter (Signed)
Patient advised of results. Patient says that she talked to the Nephrologist but they are not able to see her until 07/2016. Patient states that the Nephrologist told her to go ahead and have Dr. Caryn Section increase Spironolactone to 200mg . Patient states she is currently on the 100mg  dose. Patient wants the 200mg  dose to be sent into the pharmacy.

## 2016-06-16 MED ORDER — SPIRONOLACTONE 100 MG PO TABS: 100.0000 mg | ORAL_TABLET | Freq: Two times a day (BID) | ORAL | 3 refills | Status: DC

## 2016-06-16 NOTE — Telephone Encounter (Signed)
Patient was notified. Rx sent to pharmacy.  

## 2016-07-09 IMAGING — CT CT ABD-PELV W/ CM
1 of 3 series · 13 of 32 positions shown, 18 images · IV contrast (omnipaque)
Comparison: PET-CT 05/13/2010.

CLINICAL DATA: Acute onset of epigastric abdominal pain which began
approximately 10 hr ago associated with nausea and diaphoresis.
Surgical history includes appendectomy, hysterectomy and
cholecystectomy.

EXAM:
CT ABDOMEN AND PELVIS WITH CONTRAST
TECHNIQUE: Multidetector CT imaging of the abdomen and pelvis was performed
using the standard protocol following bolus administration of
intravenous contrast.
CONTRAST:  80mL OMNIPAQUE IOHEXOL 350 MG/ML IV. Oral contrast was
also administered.

[Series 2: routine abd pel with · axial · 0.65mm/px · z∈[-532,-106]mm · 13 of 95 slices shown, 18 images]
[im 5/95  soft-tissue]
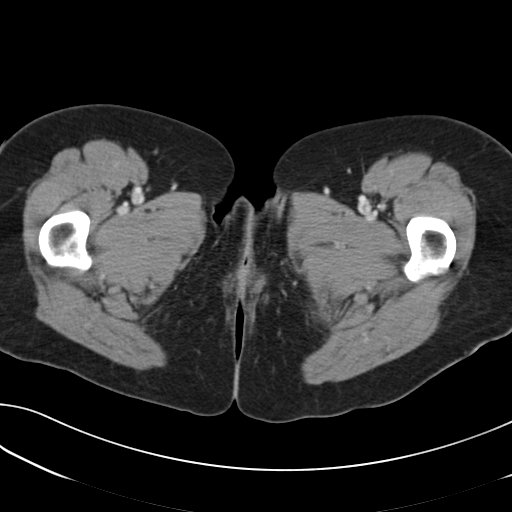
[im 5/95  bone]
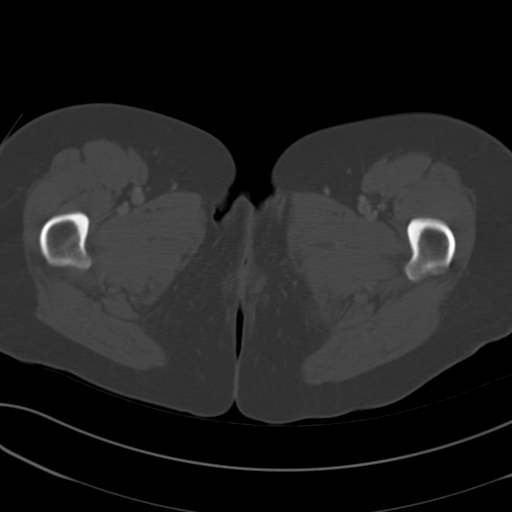
[im 15/95  soft-tissue]
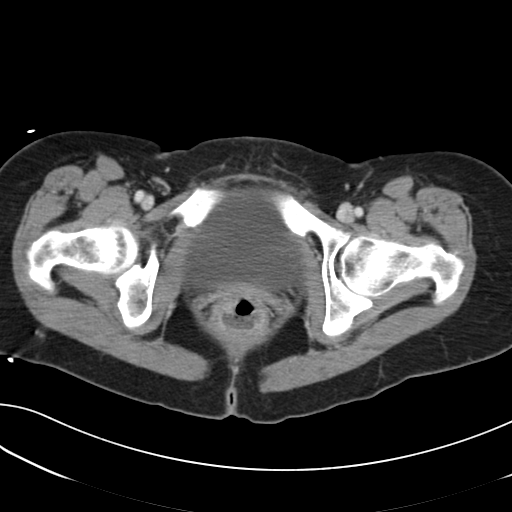
[im 19/95  soft-tissue]
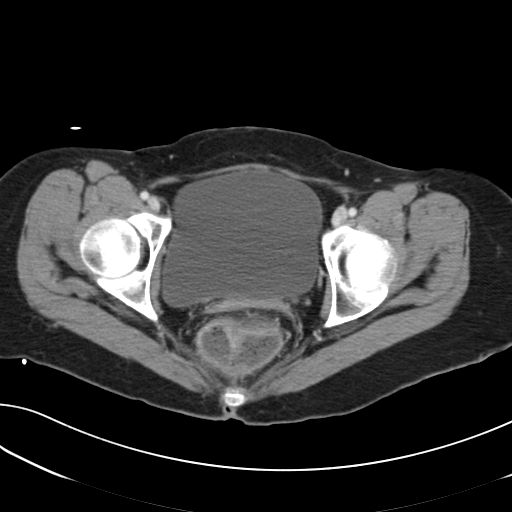
[im 29/95  soft-tissue]
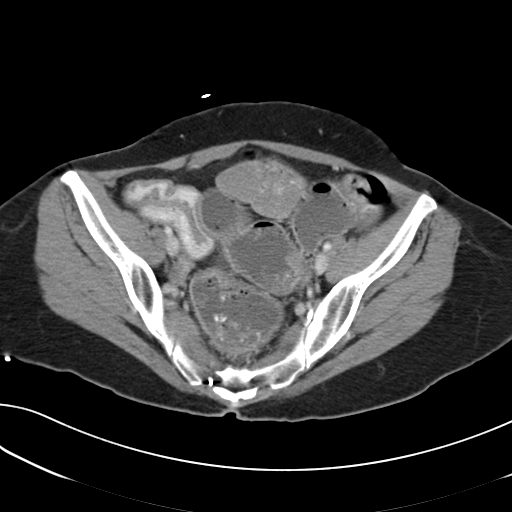
[im 38/95  soft-tissue]
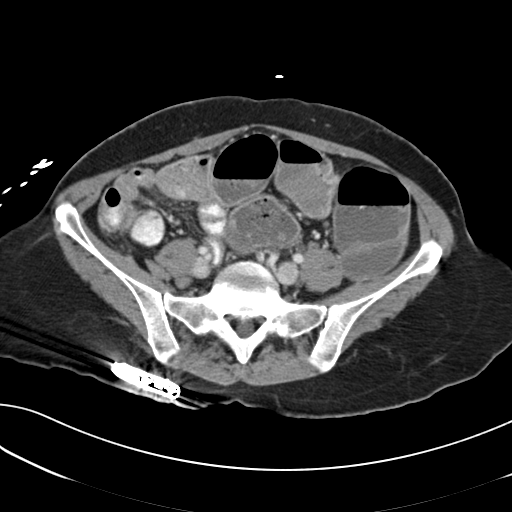
[im 43/95  soft-tissue]
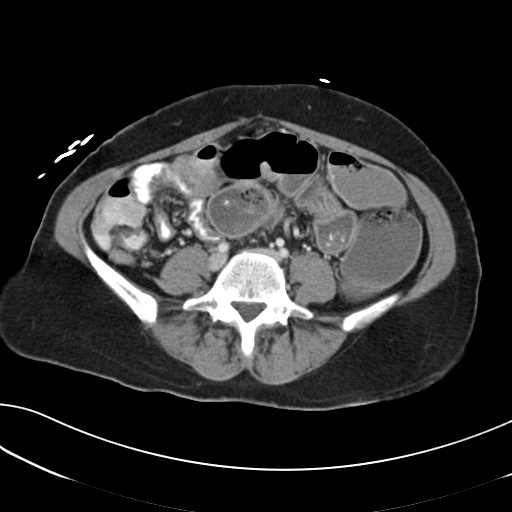
[im 52/95  soft-tissue]
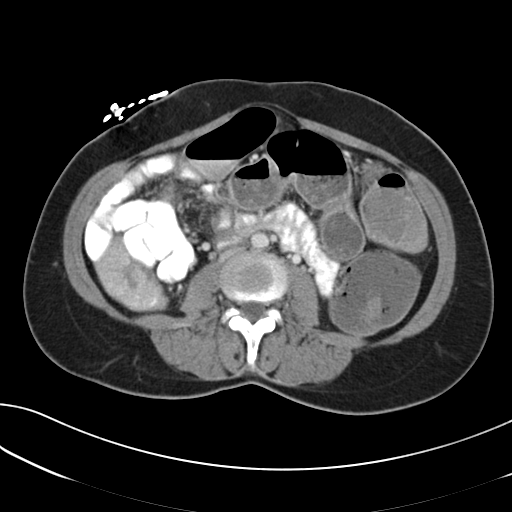
[im 57/95  soft-tissue]
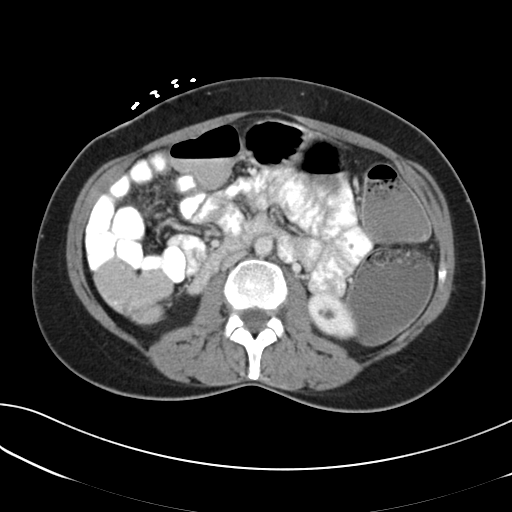
[im 66/95  soft-tissue]
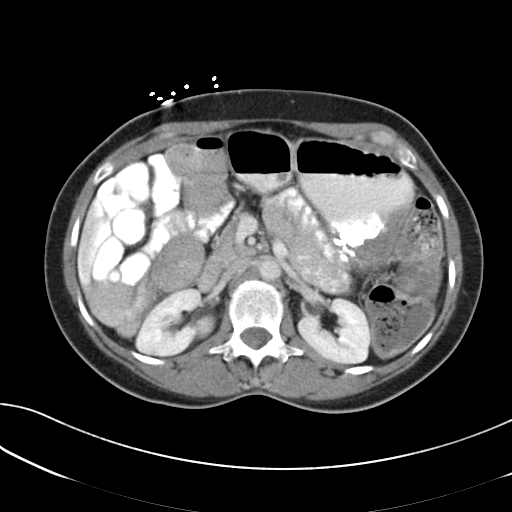
[im 66/95  bone]
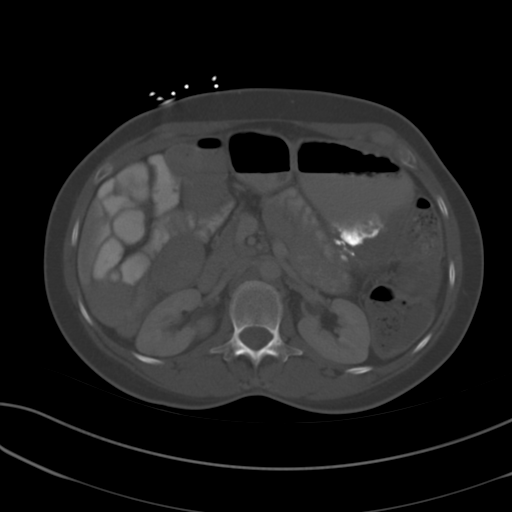
[im 76/95  soft-tissue]
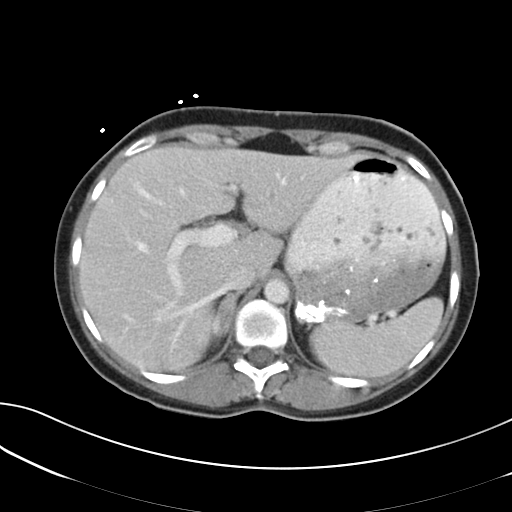
[im 76/95  lung]
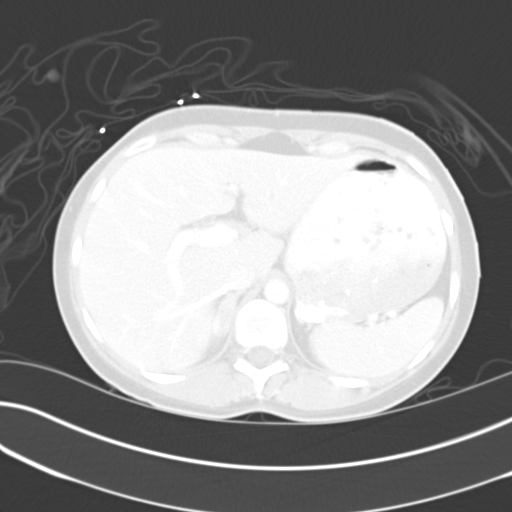
[im 80/95  soft-tissue]
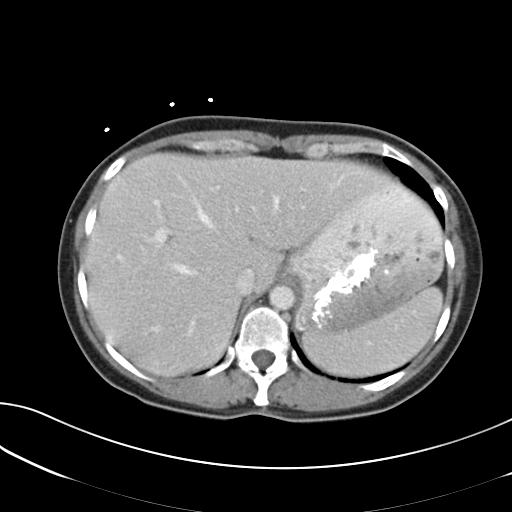
[im 80/95  lung]
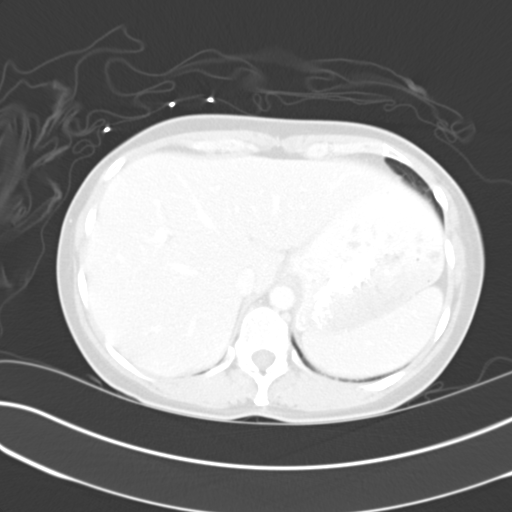
[im 85/95  lung]
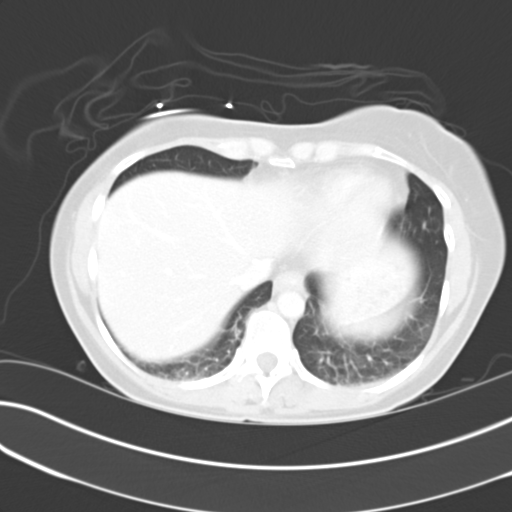
[im 90/95  soft-tissue]
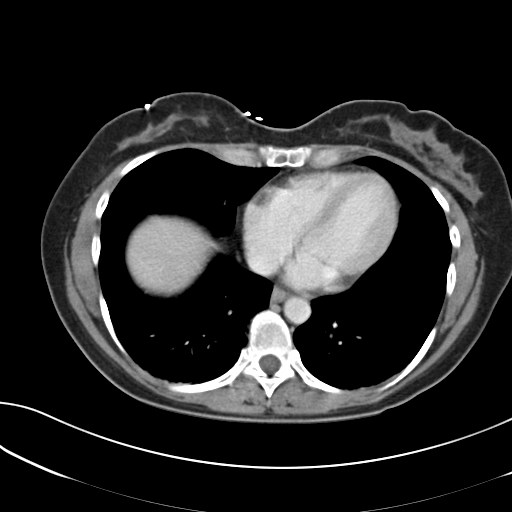
[im 90/95  lung]
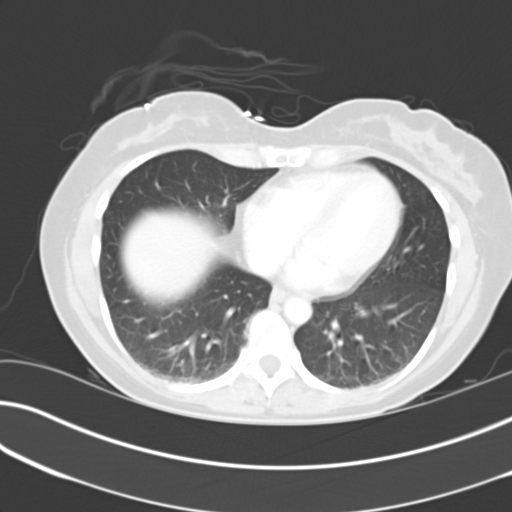

[13 of 32 positions shown; findings below may reference images not displayed]

FINDINGS: Liver:  Normal in size and appearance.

Biliary: Surgically absent gallbladder. No biliary ductal dilation.

Spleen:  Normal in size and appearance.

Pancreas:  Normal in appearance.  No pancreatic ductal dilation.

Adrenal glands: Right adrenal nodule measuring approximately 2.3 x
1.1 x 2.2 cm, unchanged from the prior PET-CT and not hypermetabolic
on that examination. Normal left adrenal gland.

Genitourinary: Simple cysts versus calyceal diverticulum arising
from the lower pole of the left kidney. Left kidney otherwise normal
in appearance. Normal appearing right kidney. No urinary tract
calculi. Normal-appearing urinary bladder.

Uterus surgically absent. Small bilateral ovarian cysts consistent
with age. No adnexal masses or free pelvic fluid.

Gastrointestinal: Stomach normal in appearance distended with oral
contrast and food. Wall thickening involving a several cm segment of
the small bowel in the right side of the abdomen and pelvis, likely
proximal to mid ileum, and wall thickening involving a several cm
segment of the distal ileum. Remainder of the small bowel normal in
appearance. Liquid stool throughout the colon from cecum to rectum.
Normal appearing colon. Of note, the transverse colon extends low
into the pelvis. Appendix surgically absent.

Vascular:  No visible aortoiliofemoral atherosclerosis.

Lymphatic:  No pathologic lymphadenopathy in the abdomen or pelvis.

Ascites: Absent.

Musculoskeletal: Regional skeleton intact.

Other findings: Implantable neural stimulator device in the right
buttock with the lead tips ending in the right low pelvis adjacent
to the distal rectum.

Visualized lower thorax: Heart size normal.  Lung bases clear.
IMPRESSION: 1. Skip areas of segmental wall thickening involving the proximal to
mid ileum and the distal ileum, not including the terminal ileum.
Infectious enteritis is favored over inflammatory bowel disease.
2. Liquid stool throughout the otherwise normal appearing colon.
3. Simple cyst or calyceal diverticulum arising from the lower pole
of the left kidney.
4. Right adrenal nodule, stable dating back to 9788, consistent with
adenoma.

## 2016-07-17 ENCOUNTER — Other Ambulatory Visit: Payer: Self-pay | Admitting: Family Medicine

## 2016-07-17 MED ORDER — OXYCODONE-ACETAMINOPHEN 7.5-325 MG PO TABS: ORAL_TABLET | ORAL | 0 refills | Status: DC

## 2016-07-17 NOTE — Telephone Encounter (Signed)
Pt contacted office for refill request on the following medications: ° °oxyCODONE-acetaminophen (PERCOCET) 7.5-325 MG tablet  ° °CB#[phone]/MW °

## 2016-08-05 ENCOUNTER — Other Ambulatory Visit: Payer: Self-pay | Admitting: Family Medicine

## 2016-08-08 ENCOUNTER — Other Ambulatory Visit: Payer: Self-pay

## 2016-08-08 MED ORDER — PROMETHAZINE HCL 25 MG PO TABS: 25.0000 mg | ORAL_TABLET | Freq: Four times a day (QID) | ORAL | 5 refills | Status: DC | PRN

## 2016-08-08 NOTE — Telephone Encounter (Signed)
Patient requesting refill on phenergan 25 mg. Last ov 05-29-16. Please review. sd

## 2016-08-15 ENCOUNTER — Telehealth: Payer: Self-pay | Admitting: Family Medicine

## 2016-08-15 NOTE — Telephone Encounter (Signed)
Please advise. Thanks.  

## 2016-08-15 NOTE — Telephone Encounter (Signed)
Pt needs refill   oxyCODONE-acetaminophen (PERCOCET) 7.5-325 MG tablet   07/17/16 -- [name], MD    One tablet every 4-5 hours as needed     Thanks Con Memos

## 2016-08-16 MED ORDER — OXYCODONE-ACETAMINOPHEN 7.5-325 MG PO TABS: ORAL_TABLET | ORAL | 0 refills | Status: DC

## 2016-09-12 ENCOUNTER — Other Ambulatory Visit: Payer: Self-pay

## 2016-09-12 NOTE — Telephone Encounter (Signed)
Last refill 08/16/2016. Renaldo Fiddler, CMA

## 2016-09-14 NOTE — Telephone Encounter (Signed)
Pt called to see if Rx for oxyCODONE-acetaminophen (PERCOCET) 7.5-325 MG tablet was ready for pick up. Please advise. Thanks TNP

## 2016-09-15 MED ORDER — OXYCODONE-ACETAMINOPHEN 7.5-325 MG PO TABS: ORAL_TABLET | ORAL | 0 refills | Status: DC

## 2016-09-15 NOTE — Telephone Encounter (Signed)
Pt called to see if Rx was ready for pick up. Please advise. Thanks TNP

## 2016-09-22 ENCOUNTER — Telehealth: Payer: Self-pay | Admitting: Urology

## 2016-09-22 NOTE — Telephone Encounter (Signed)
Patient is coming in as a new patient and coming in on 10-10-16 to see macdiarmid due to her medtronic device not working. She would like for the rep to be here for her appt. It is at 2:00 that day. She has had her records transferred from [facility] in [place]. I have requested the records.   Thanks, Sharyn Lull

## 2016-09-28 NOTE — Telephone Encounter (Signed)
Left pt message to call/SW

## 2016-10-02 NOTE — Telephone Encounter (Signed)
Spoke with patient and notified her that the rep would not be able to come to the apt next week but I would do an impedance check and try new programs to see if that helps improve patient's symptoms. Patient verbalized understanding

## 2016-10-04 ENCOUNTER — Other Ambulatory Visit: Payer: Self-pay

## 2016-10-10 ENCOUNTER — Ambulatory Visit: Payer: Managed Care, Other (non HMO) | Admitting: Urology

## 2016-10-10 ENCOUNTER — Ambulatory Visit
Admission: RE | Admit: 2016-10-10 | Discharge: 2016-10-10 | Disposition: A | Discharge status: Home / Self Care | Payer: Managed Care, Other (non HMO) | Source: Ambulatory Visit | Attending: Urology | Admitting: Urology

## 2016-10-10 VITALS — BP 155/73 | HR 99 | Ht 62.0 in | Wt 108.1 lb

## 2016-10-10 DIAGNOSIS — N319 Neuromuscular dysfunction of bladder, unspecified: Secondary | ICD-10-CM | POA: Insufficient documentation

## 2016-10-10 DIAGNOSIS — Z7689 Persons encountering health services in other specified circumstances: Secondary | ICD-10-CM | POA: Insufficient documentation

## 2016-10-10 LAB — URINALYSIS, COMPLETE
Bilirubin, UA: NEGATIVE
Glucose, UA: NEGATIVE
KETONES UA: NEGATIVE
Leukocytes, UA: NEGATIVE
NITRITE UA: NEGATIVE
Protein, UA: NEGATIVE
SPEC GRAV UA: 1.01 (ref 1.005–1.030)
UUROB: 0.2 mg/dL (ref 0.2–1.0)
pH, UA: 7 (ref 5.0–7.5)

## 2016-10-10 LAB — MICROSCOPIC EXAMINATION: Bacteria, UA: NONE SEEN

## 2016-10-10 NOTE — Progress Notes (Signed)
10/10/2016 2:31 PM   Kayla Curry 1970-09-03 [id number]  Referring provider: [name], MD [address] [place], [place] [address]  Chief Complaint  Patient presents with  . Follow-up    urinary retention    HPI: The patient has a complicated InterStim history. I did a revision in 2013 and then again in 2014. She has incomplete bladder emptying and urinary retention that responds favorably to InterStim area she has had urinary tract infections needing Rocephin in the past. I spent a lot of time reviewing my medical records and her last surgery was in July 2014. I believe the right side it lead was replaced in the exact same position and I used the same IPG. At one point in time she had 2 leads and the one on the left was very sensitive with leg pain.  The patient in the last 2 weeks had to go back on self-catheterization. Prior to this she was voiding 4-5 times a day with a good flow and had no nocturia. She recently was treated twice for urinary tract infection with no improvement. She has not been getting urinary tract infections and they may have been over diagnosed  She reiterated that all the hardware is now on the right and her left-sided pain since removal of the left lead no longer is present. She has tried different settings and different amplitudes with no benefit. She used to feel vaginal sensation that now she does not and this corresponds with her retention. She feels a little bit of a pull in the right foot which she has for a long while  Modifying factors: There are no other modifying factors  Associated signs and symptoms: There are no other associated signs and symptoms Aggravating and relieving factors: There are no other aggravating or relieving factors Severity: Moderate Duration: Persistent          PMH: Past Medical History:  Diagnosis Date  . History of pelvic fracture   . History of supraventricular tachycardia    PER PT ON  02-28-2013 NO ISSUES SINCE ABLATION IN 2005  . 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 Mass of adrenal gland ([place])   . Pelvic pain   . S/P ablation of ventricular arrhythmia HX SVT  [place]--  LAST VISIT 2012 -- PT RELEASED FROM CARE ON PRN BASIS  . Urinary retention    Self cath TID and prn    Surgical History: Past Surgical History:  Procedure Laterality Date  . APPENDECTOMY  1994   EXPL. LAP.  Marland Kitchen CARDIAC ELECTROPHYSIOLOGY STUDY AND ABLATION  2005- AT [facility]   SVT--  PT STATES NO ISSUES SINCE   . INTERSTIM IMPLANT PLACEMENT  2009[employer]  . INTERSTIM IMPLANT PLACEMENT  10/05/2011   Procedure: Barrie Lyme IMPLANT FIRST STAGE;  Surgeon: [name], MD;  [facility]: [facility];  Service: Urology;  Laterality: N/A;  . INTERSTIM IMPLANT PLACEMENT  10/05/2011   Procedure: Barrie Lyme IMPLANT SECOND STAGE;  Surgeon: [name], MD;  Location: [facility];  Service: Urology;  Laterality: N/A;  . INTERSTIM IMPLANT REVISION N/A 03/06/2013   Procedure: REVISION OF Barrie Lyme;  Surgeon: [name], MD;  Location[facility];  Service: Urology;  Laterality: N/A;  . LAPAROSCOPIC CHOLECYSTECTOMY  2001  . TONSILLECTOMY  1998  . VAGINAL HYSTERECTOMY  2003    Home Medications:  Allergies as of 10/10/2016      Reactions   Advair Diskus [fluticasone-salmeterol] Shortness Of Breath   Albuterol Sulfate Shortness Of Breath   Atrovent Shortness Of Breath   Bethanechol Shortness Of Breath, Other (See Comments), Rash   RESPIRATORY DISTRESS RESPIRATORY DISTRESS   Budesonide-formoterol Fumarate Shortness Of Breath   Influenza Virus Vaccine Split Anaphylaxis   Levofloxacin In D5w Rash   Lorazepam Other (See Comments), Rash   Other Reaction: OTHER REACTION Other Reaction: OTHER REACTION "HAS OPPOSITE EFFECT" Other Reaction:  OTHER REACTION   Xopenex [levalbuterol] Shortness Of Breath   Compazine Other (See Comments)   DYSTONIC   Fluoride Preparations    Lorazepam Other (See Comments)   "HAS OPPOSITE EFFECT"   Levofloxacin Rash   Potassium-containing Compounds Nausea And Vomiting   Just the po potassium  No problem with iv   Zofran [ondansetron Hcl] Rash      Medication List       Accurate as of 10/10/16  2:31 PM. Always use your most recent med list.          baclofen 20 MG tablet Commonly known as:  LIORESAL TAKE 1 TABLET (20 MG TOTAL) BY MOUTH 4 (FOUR) TIMES DAILY.   EPIPEN 2-PAK 0.3 mg/0.3 mL Soaj injection Generic drug:  EPINEPHrine Inject into the muscle.   esomeprazole 40 MG capsule Commonly known as:  NEXIUM Take 1 capsule (40 mg total) by mouth daily before breakfast.   gabapentin 600 MG tablet Commonly known as:  NEURONTIN TAKE 1 TABLET (600 MG TOTAL) BY MOUTH 3 (THREE) TIMES DAILY.   metaproterenol 20 MG tablet Commonly known as:  ALUPENT Take 1 tablet (20 mg total) by mouth 3 (three) times daily as needed.   oxyCODONE-acetaminophen 7.5-325 MG tablet Commonly known as:  PERCOCET One tablet every 4-5 hours as needed   Potassium 99 MG Tabs Take by mouth 2 (two) times daily.   promethazine 25 MG tablet Commonly known as:  PHENERGAN Take 1 tablet (25 mg total) by mouth every 6 (six) hours as needed for nausea.   spironolactone 100 MG tablet Commonly known as:  ALDACTONE Take 1 tablet (100 mg total) by mouth 2 (two) times daily.   SYNTHROID 75 MCG tablet Generic drug:  levothyroxine TAKE 1 TABLET BY MOUTH DAILY. PLEASE SCHEDULE OFFICE VISIT FOR FOLLOWUP   tiZANidine 4 MG tablet Commonly known as:  ZANAFLEX TAKE 1 TABLET (4 MG TOTAL) BY MOUTH 2 (TWO) TIMES DAILY.       Allergies:  Allergies  Allergen Reactions  . Advair Diskus [Fluticasone-Salmeterol] Shortness Of Breath  . Albuterol Sulfate Shortness Of Breath  . Atrovent Shortness Of Breath  . Bethanechol  Shortness Of Breath, Other (See Comments) and Rash    RESPIRATORY DISTRESS RESPIRATORY DISTRESS  . Budesonide-Formoterol Fumarate Shortness Of Breath  . Influenza Virus Vaccine Split Anaphylaxis  . Levofloxacin In D5w Rash  . Lorazepam Other (See Comments) and Rash    Other Reaction: OTHER REACTION Other Reaction: OTHER REACTION "HAS OPPOSITE EFFECT" Other Reaction: OTHER REACTION  . Xopenex [Levalbuterol] Shortness Of Breath  . Compazine Other (See Comments)    DYSTONIC  . Fluoride Preparations   . Lorazepam Other (See Comments)    "HAS OPPOSITE EFFECT"  . Levofloxacin Rash  . Potassium-Containing Compounds Nausea And Vomiting    Just the po potassium  No problem with iv  . Zofran [Ondansetron Hcl] Rash    Family History: Family History  Problem Relation  Age of Onset  . Hypertension Father   . Colon cancer Neg Hx     Social History:  reports that she has never smoked. She has never used smokeless tobacco. She reports that she does not drink alcohol or use drugs.  ROS: UROLOGY Frequent Urination?: No Hard to postpone urination?: No Burning/pain with urination?: Yes Get up at night to urinate?: No Leakage of urine?: No Urine stream starts and stops?: No Trouble starting stream?: No Do you have to strain to urinate?: No Blood in urine?: Yes Urinary tract infection?: Yes Sexually transmitted disease?: No Injury to kidneys or bladder?: No Painful intercourse?: No Weak stream?: No Currently pregnant?: No Vaginal bleeding?: No Last menstrual period?: No  Gastrointestinal Nausea?: Yes Vomiting?: Yes Indigestion/heartburn?: Yes Diarrhea?: No Constipation?: Yes  Constitutional Fever: No Night sweats?: No Weight loss?: No Fatigue?: No  Skin Skin rash/lesions?: No Itching?: No  Eyes Blurred vision?: No Double vision?: No  Ears/Nose/Throat Sore throat?: No Sinus problems?: No  Hematologic/Lymphatic Swollen glands?: No Easy bruising?:  No  Cardiovascular Leg swelling?: No Chest pain?: No  Respiratory Cough?: No Shortness of breath?: No  Endocrine Excessive thirst?: No  Musculoskeletal Back pain?: Yes Joint pain?: No  Neurological Headaches?: No Dizziness?: No  Psychologic Depression?: No Anxiety?: No  Physical Exam: BP (!) 155/73   Pulse 99   Ht 5\' 2"  (1.575 m)   Wt 49 kg (108 lb 1.6 oz)   BMI 19.77 kg/m   Constitutional:  Alert and oriented, No acute distress. HEENT: [place] AT, moist mucus membranes.  Trachea midline, no masses. Cardiovascular: No clubbing, cyanosis, or edema. Respiratory: Normal respiratory effort, no increased work of breathing. GI: Abdomen is soft, nontender, nondistended, no abdominal masses GU: No CVA tenderness. The incisions look good. She had no tenderness or cellulitis Skin: No rashes, bruises or suspicious lesions. Lymph: No cervical or inguinal adenopathy. Neurologic: Grossly intact, no focal deficits, moving all 4 extremities. Psychiatric: Normal mood and affect.  Laboratory Data: Lab Results  Component Value Date   WBC 5.5 04/07/2016   HGB 12.5 04/07/2016   HCT 36.5 04/07/2016   MCV 91.1 04/07/2016   PLT 169 04/07/2016    Lab Results  Component Value Date   CREATININE 0.50 (L) 06/14/2016    No results found for: PSA  No results found for: TESTOSTERONE  No results found for: HGBA1C  Urinalysis No results found for: COLORURINE, APPEARANCEUR, LABSPEC, PHURINE, GLUCOSEU, HGBUR, BILIRUBINUR, KETONESUR, PROTEINUR, UROBILINOGEN, NITRITE, LEUKOCYTESUR  Pertinent Imaging: None  Assessment & Plan:  The patient has idiopathic urinary retention that responds favorably to InterStim. Unfortunately the durability of the treatment has not been ideal. She has had pain on the left side before.  A lot of troubleshooting was done by Judson Roch as well as the [employer] representative over the phone. They rerouted one of the pathways but she could still feel in her foot. They  could not get good vaginal sensation but it appears that the impedance checks were normal. She's going to try the new setting. We will then turned off and on to see if that helps. We will get an AP and lateral view of the sacrum. We will follow her by telephone and reassess her in a few weeks.  There are no diagnoses linked to this encounter.  No Follow-up on file.  [name], MD  [facility] [address], [place], [place] [address] [phone]

## 2016-10-11 ENCOUNTER — Other Ambulatory Visit: Payer: Self-pay | Admitting: Family Medicine

## 2016-10-12 ENCOUNTER — Ambulatory Visit
Admission: RE | Admit: 2016-10-12 | Discharge: 2016-10-12 | Disposition: A | Discharge status: Home / Self Care | Payer: Managed Care, Other (non HMO) | Source: Ambulatory Visit | Attending: Urology | Admitting: Urology

## 2016-10-12 ENCOUNTER — Telehealth: Payer: Self-pay

## 2016-10-12 DIAGNOSIS — R339 Retention of urine, unspecified: Secondary | ICD-10-CM

## 2016-10-12 DIAGNOSIS — Z9689 Presence of other specified functional implants: Secondary | ICD-10-CM | POA: Diagnosis not present

## 2016-10-12 NOTE — Telephone Encounter (Signed)
Spoke with patient and notified her that the x-ray done Tuesday did not show the lateral view and we will need another x-ray with lateral view to check on interstim placement. Patient states she understands and will go for another x-ray this afternoon. Order was placed and called imaging center to confirm a lateral view would be done today.

## 2016-10-16 ENCOUNTER — Other Ambulatory Visit: Payer: Self-pay

## 2016-10-16 NOTE — Telephone Encounter (Signed)
Patient called for refill request on Oxycodone, last filled 1/26/418. Please let patient know when ready-aa

## 2016-10-17 MED ORDER — OXYCODONE-ACETAMINOPHEN 7.5-325 MG PO TABS: ORAL_TABLET | ORAL | 0 refills | Status: DC

## 2016-10-17 NOTE — Telephone Encounter (Signed)
Please advise patient, I was on the phones yesterday-aa

## 2016-10-18 NOTE — Telephone Encounter (Signed)
Pt picked up already-aa

## 2016-10-19 ENCOUNTER — Encounter: Payer: Self-pay | Admitting: Physician Assistant

## 2016-10-19 ENCOUNTER — Ambulatory Visit: Payer: Self-pay | Admitting: Physician Assistant

## 2016-10-19 VITALS — BP 140/89 | HR 97 | Temp 98.4°F

## 2016-10-19 DIAGNOSIS — J209 Acute bronchitis, unspecified: Secondary | ICD-10-CM

## 2016-10-19 MED ORDER — HYDROCOD POLST-CPM POLST ER 10-8 MG/5ML PO SUER: 5.0000 mL | Freq: Two times a day (BID) | ORAL | 0 refills | Status: DC

## 2016-10-19 MED ORDER — BENZONATATE 100 MG PO CAPS: 100.0000 mg | ORAL_CAPSULE | Freq: Three times a day (TID) | ORAL | 0 refills | Status: DC | PRN

## 2016-10-19 MED ORDER — SULFAMETHOXAZOLE-TRIMETHOPRIM 800-160 MG PO TABS: 1.0000 | ORAL_TABLET | Freq: Two times a day (BID) | ORAL | 0 refills | Status: DC

## 2016-10-19 NOTE — Progress Notes (Signed)
   Subjective:cough    Patient ID: Kayla Curry, female    DOB: [birth year], 46 y.o.   MRN: [id number]  HPI Patient c/o cough for 2 weeks. Denies fever but have chills. Denies Vomiting/diarhhea but have nausea.No palliative measures for compliant.   Review of Systems Exteensive medication allergies.    Objective:   Physical Exam HEENT unremarkable. Neck supple w/o adenopathy. Lungs with bilateral Rales. Heart RRR.       Assessment & Plan:Bronchitis  Bactrim DS, Tessalon, and Tussionex for night use. Follow up PCP 3-5 days if no improvement.

## 2016-10-19 NOTE — Addendum Note (Signed)
Addended by: Sable Feil on: 10/19/2016 04:44 PM   Modules accepted: Orders

## 2016-10-20 ENCOUNTER — Other Ambulatory Visit: Payer: Self-pay

## 2016-10-20 MED ORDER — PROMETHAZINE HCL 25 MG PO TABS: 25.0000 mg | ORAL_TABLET | Freq: Four times a day (QID) | ORAL | 5 refills | Status: DC | PRN

## 2016-10-20 NOTE — Telephone Encounter (Signed)
Patient is requesting refills due to nausea with taking potassium supplements. Patient is requesting a quanitiy of 60 instead of 30. Thanks!

## 2016-10-23 ENCOUNTER — Encounter: Payer: Self-pay | Admitting: Urology

## 2016-10-23 ENCOUNTER — Ambulatory Visit: Payer: Managed Care, Other (non HMO) | Admitting: Urology

## 2016-10-23 VITALS — BP 136/78 | HR 93 | Ht 62.0 in | Wt 108.3 lb

## 2016-10-23 DIAGNOSIS — R339 Retention of urine, unspecified: Secondary | ICD-10-CM | POA: Diagnosis not present

## 2016-10-23 NOTE — Progress Notes (Signed)
10/23/2016 3:59 PM   [name] 09/27/1970 [id number]  Referring provider: [name], MD [address] [place], [place] [address]  Chief Complaint  Patient presents with  . Follow-up    bladder disorder    HPI: The patient is well known to myself in [place]. She is still in retention performing self-catheterization. The new settings did not make a difference. Clinically she is not infected  Incomplete bladder emptying is stable     PMH: Past Medical History:  Diagnosis Date  . History of pelvic fracture   . History of supraventricular tachycardia    PER PT ON 02-28-2013 NO ISSUES SINCE ABLATION IN 2005  . 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 Mass of adrenal gland ([place])   . Pelvic pain   . S/P ablation of ventricular arrhythmia HX SVT  [place]--  LAST VISIT 2012 -- PT RELEASED FROM CARE ON PRN BASIS  . Urinary retention    Self cath TID and prn    Surgical History: Past Surgical History:  Procedure Laterality Date  . APPENDECTOMY  1994   EXPL. LAP.  Marland Kitchen CARDIAC ELECTROPHYSIOLOGY STUDY AND ABLATION  2005- AT [facility]   SVT--  PT STATES NO ISSUES SINCE   . INTERSTIM IMPLANT PLACEMENT  2009[facility]  . INTERSTIM IMPLANT PLACEMENT  10/05/2011   Procedure: Barrie Lyme IMPLANT FIRST STAGE;  Surgeon: [name], MD;  Location[facility];  Service: Urology;  Laterality: N/A;  . INTERSTIM IMPLANT PLACEMENT  10/05/2011   Procedure: Barrie Lyme IMPLANT SECOND STAGE;  Surgeon: [name], MD;  [facility];  Service: Urology;  Laterality: N/A;  . INTERSTIM IMPLANT REVISION N/A 03/06/2013   Procedure: REVISION OF Barrie Lyme;  Surgeon: [name], MD;  Location: [facility];  Service: Urology;  Laterality: N/A;  . LAPAROSCOPIC CHOLECYSTECTOMY  2001  . TONSILLECTOMY  1998    . VAGINAL HYSTERECTOMY  2003    Home Medications:  Allergies as of 10/23/2016      Reactions   Advair Diskus [fluticasone-salmeterol] Shortness Of Breath   Albuterol Sulfate Shortness Of Breath   Atrovent Shortness Of Breath   Bethanechol Shortness Of Breath, Other (See Comments), Rash   RESPIRATORY DISTRESS RESPIRATORY DISTRESS   Budesonide-formoterol Fumarate Shortness Of Breath   Influenza Virus Vaccine Split Anaphylaxis   Levofloxacin In D5w Rash   Lorazepam Other (See Comments), Rash   Other Reaction: OTHER REACTION Other Reaction: OTHER REACTION "HAS OPPOSITE EFFECT" Other Reaction: OTHER REACTION   Xopenex [levalbuterol] Shortness Of Breath   Compazine Other (See Comments)   DYSTONIC   Fluoride Preparations    Lorazepam Other (See Comments)   "HAS OPPOSITE EFFECT"   Levofloxacin Rash   Potassium-containing Compounds Nausea And Vomiting   Just the po potassium  No problem with iv   Zofran [ondansetron Hcl] Rash      Medication List       Accurate as of 10/23/16  3:59 PM. Always use your most recent med list.          baclofen 20 MG tablet Commonly known as:  LIORESAL TAKE 1 TABLET (20 MG TOTAL) BY MOUTH 4 (FOUR) TIMES DAILY.   benzonatate 100 MG capsule Commonly known as:  TESSALON PERLES Take 1 capsule (100 mg total) by mouth 3 (three) times daily as needed  for cough.   chlorpheniramine-HYDROcodone 10-8 MG/5ML Suer Commonly known as:  TUSSIONEX PENNKINETIC ER Take 5 mLs by mouth 2 (two) times daily.   EPIPEN 2-PAK 0.3 mg/0.3 mL Soaj injection Generic drug:  EPINEPHrine Inject into the muscle.   esomeprazole 40 MG capsule Commonly known as:  NEXIUM Take 1 capsule (40 mg total) by mouth daily before breakfast.   gabapentin 600 MG tablet Commonly known as:  NEURONTIN TAKE 1 TABLET (600 MG TOTAL) BY MOUTH 3 (THREE) TIMES DAILY.   metaproterenol 20 MG tablet Commonly known as:  ALUPENT Take 1 tablet (20 mg total) by mouth 3 (three) times daily as  needed.   oxyCODONE-acetaminophen 7.5-325 MG tablet Commonly known as:  PERCOCET One tablet every 4-5 hours as needed   Potassium 99 MG Tabs Take by mouth 2 (two) times daily.   promethazine 25 MG tablet Commonly known as:  PHENERGAN Take 1 tablet (25 mg total) by mouth every 6 (six) hours as needed for nausea.   spironolactone 100 MG tablet Commonly known as:  ALDACTONE TAKE 1 TABLET (100 MG TOTAL) BY MOUTH 2 (TWO) TIMES DAILY.   sulfamethoxazole-trimethoprim 800-160 MG tablet Commonly known as:  BACTRIM DS,SEPTRA DS Take 1 tablet by mouth 2 (two) times daily.   SYNTHROID 75 MCG tablet Generic drug:  levothyroxine TAKE 1 TABLET BY MOUTH DAILY. PLEASE SCHEDULE OFFICE VISIT FOR FOLLOWUP   tiZANidine 4 MG tablet Commonly known as:  ZANAFLEX TAKE 1 TABLET (4 MG TOTAL) BY MOUTH 2 (TWO) TIMES DAILY.       Allergies:  Allergies  Allergen Reactions  . Advair Diskus [Fluticasone-Salmeterol] Shortness Of Breath  . Albuterol Sulfate Shortness Of Breath  . Atrovent Shortness Of Breath  . Bethanechol Shortness Of Breath, Other (See Comments) and Rash    RESPIRATORY DISTRESS RESPIRATORY DISTRESS  . Budesonide-Formoterol Fumarate Shortness Of Breath  . Influenza Virus Vaccine Split Anaphylaxis  . Levofloxacin In D5w Rash  . Lorazepam Other (See Comments) and Rash    Other Reaction: OTHER REACTION Other Reaction: OTHER REACTION "HAS OPPOSITE EFFECT" Other Reaction: OTHER REACTION  . Xopenex [Levalbuterol] Shortness Of Breath  . Compazine Other (See Comments)    DYSTONIC  . Fluoride Preparations   . Lorazepam Other (See Comments)    "HAS OPPOSITE EFFECT"  . Levofloxacin Rash  . Potassium-Containing Compounds Nausea And Vomiting    Just the po potassium  No problem with iv  . Zofran [Ondansetron Hcl] Rash    Family History: Family History  Problem Relation Age of Onset  . Hypertension Father   . Colon cancer Neg Hx     Social History:  reports that she has never  smoked. She has never used smokeless tobacco. She reports that she does not drink alcohol or use drugs.  ROS: UROLOGY Frequent Urination?: No Hard to postpone urination?: No Burning/pain with urination?: No Get up at night to urinate?: No Leakage of urine?: No Urine stream starts and stops?: No Trouble starting stream?: No Do you have to strain to urinate?: No Blood in urine?: No Urinary tract infection?: No Sexually transmitted disease?: No Injury to kidneys or bladder?: No Painful intercourse?: No Weak stream?: No Currently pregnant?: No Vaginal bleeding?: No Last menstrual period?: n  Gastrointestinal Nausea?: No Vomiting?: No Indigestion/heartburn?: No Diarrhea?: No Constipation?: No  Constitutional Fever: No Night sweats?: No Weight loss?: No Fatigue?: No  Skin Skin rash/lesions?: No Itching?: No  Eyes Blurred vision?: No Double vision?: No  Ears/Nose/Throat Sore throat?: No Sinus problems?: No  Hematologic/Lymphatic Swollen glands?: No Easy bruising?: No  Cardiovascular Leg swelling?: No Chest pain?: No  Respiratory Cough?: No Shortness of breath?: No  Endocrine Excessive thirst?: No  Musculoskeletal Back pain?: No Joint pain?: No  Neurological Headaches?: No Dizziness?: No  Psychologic Depression?: No Anxiety?: No  Physical Exam: BP 136/78 (BP Location: Left Arm, Patient Position: Sitting, Cuff Size: Normal)   Pulse 93   Ht 5\' 2"  (1.575 m)   Wt 49.1 kg (108 lb 4.8 oz)   BMI 19.81 kg/m    Laboratory Data: Lab Results  Component Value Date   WBC 5.5 04/07/2016   HGB 12.5 04/07/2016   HCT 36.5 04/07/2016   MCV 91.1 04/07/2016   PLT 169 04/07/2016    Lab Results  Component Value Date   CREATININE 0.50 (L) 06/14/2016    No results found for: PSA  No results found for: TESTOSTERONE  No results found for: HGBA1C  Urinalysis    Component Value Date/Time   APPEARANCEUR Clear 10/10/2016 1428   GLUCOSEU Negative  10/10/2016 1428   BILIRUBINUR Negative 10/10/2016 1428   PROTEINUR Negative 10/10/2016 1428   NITRITE Negative 10/10/2016 1428   LEUKOCYTESUR Negative 10/10/2016 1428    Pertinent Imaging: none  Assessment & Plan:  The patient has incomplete bladder emptying. Similar to last time we decided to replace the entire implant but this time the IPG as well. We talked about retained tip and the chances of needing to use the left side if needed intraoperatively. We both recognize she has had pain on the left side before. She understands increased risk of infection and other complications. Watchful waiting and clean intermittent catheterization as an option was discussed. 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  I discussed the findings with her. She also understands that was scarring the lead may be dictated by previous insertions  I was teasing her about being a grandmother  There are no diagnoses linked to this encounter.  No Follow-up on file.  [name], MD  [facility] [address] [place], [place] [address] [phone]

## 2016-10-24 ENCOUNTER — Other Ambulatory Visit: Payer: Self-pay | Admitting: Urology

## 2016-11-05 ENCOUNTER — Other Ambulatory Visit: Payer: Self-pay | Admitting: Family Medicine

## 2016-11-07 ENCOUNTER — Encounter (HOSPITAL_BASED_OUTPATIENT_CLINIC_OR_DEPARTMENT_OTHER): Payer: Self-pay | Admitting: *Deleted

## 2016-11-07 NOTE — Progress Notes (Addendum)
NPO AFTER MN.  ARRIVE AT 3709.  NEEDS ISTAT 8.  ASK MDA IF EKG NEEDED.  WILL TAKE GABAPENTIN, NEXIUM, SYNTHROID, ONE TYPE MUSCLE RELAXANT AND POTASSIUM AM  DOS W/ SIPS OF WATER     ADDENDUM:  CALLED AND LM VIA PHONE FOR TO ARRIVE AT [place].  IN CASE MDA DOS ORDERS IV POTASSIUM PRE-OP. ASKED PT TO CALL BACK W/ CONFIRMATION SHE GOT MESSAGE.

## 2016-11-08 ENCOUNTER — Other Ambulatory Visit: Payer: Self-pay | Admitting: Family Medicine

## 2016-11-08 NOTE — Telephone Encounter (Signed)
Pt contacted office for refill request on the following medications:  oxyCODONE-acetaminophen (PERCOCET) 7.5-325 MG tablet.  CB#[phone]/MW  Pt is requesting this a week early due to having surgery next week/MW

## 2016-11-09 MED ORDER — OXYCODONE-ACETAMINOPHEN 7.5-325 MG PO TABS: ORAL_TABLET | ORAL | 0 refills | Status: DC

## 2016-11-10 NOTE — H&P (Signed)
Chief Complaint: Retention  History of Present Illness: The patient has a complicated InterStim history. I did a revision in 2013 and then again in 2014. She has incomplete bladder emptying and urinary retention that responds favorably to InterStim area she has had urinary tract infections needing Rocephin in the past. I spent a lot of time reviewing my medical records and her last surgery was in July 2014. I believe the right side it lead was replaced in the exact same position and I used the same IPG. At one point in time she had 2 leads and the one on the left was very sensitive with leg pain.  The patient in the last 2 weeks had to go back on self-catheterization. Prior to this she was voiding 4-5 times a day with a good flow and had no nocturia. She recently was treated twice for urinary tract infection with no improvement. She has not been getting urinary tract infections and they may have been over diagnosed  She reiterated that all the hardware is now on the right and her left-sided pain since removal of the left lead no longer is present. She has tried different settings and different amplitudes with no benefit. She used to feel vaginal sensation that now she does not and this corresponds with her retention. She feels a little bit of a pull in the right foot which she has for a long while  Modifying factors: There are no other modifying factors  Associated signs and symptoms: There are no other associated signs and symptoms Aggravating and relieving factors: There are no other aggravating or relieving factors Severity: Moderate Duration: Persistent  Past Medical History:  Diagnosis Date  . Adrenal adenoma, right   . Chronic pain syndrome   . Gitelman syndrome    DISORDER MAGNESIUM METABOLISM  . History of pelvic fracture   . History of supraventricular tachycardia    PER PT ON 02-28-2013 NO ISSUES SINCE ABLATION IN 2005  . Hypokalemia    CHRONIC  . Hypothyroidism   . Injury of  pelvis or lower limb peripheral nerve, late effect    HX PELVIC FX-- RESIDUAL URINARY RETENTION  . Magnesium metabolism disorder NEPHROLOGIST-  DR [name] ([facility] [place])   [place]  . Malfunction of device    INTERSTIM  . Neurogenic bladder   . S/P ablation of ventricular arrhythmia HX SVT--  2005   [place]--  LAST VISIT 2012 -- PT RELEASED FROM CARE ON PRN BASIS  . Self-catheterizes urinary bladder   . Urinary retention    RESIDUAL FROM PELVIC FX INJURY---Self cath TID and prn  . Wears contact lenses    Past Surgical History:  Procedure Laterality Date  . APPENDECTOMY  1994   EXPL. LAP.  Marland Kitchen CARDIAC ELECTROPHYSIOLOGY STUDY AND ABLATION  2005- AT [facility]   SVT--  PT STATES NO ISSUES SINCE   . INTERST[facility] IMPLANT PLACEMENT  [facility]  . INTERSTIM IMPLANT PLACEMENT  10/05/2011   Procedure: Barrie Lyme IMPLANT FIRST STAGE;  Surgeon: [name], MD;  Location[facility];  Service: Urology;  Laterality: N/A;  . INTERSTIM IMPLANT PLACEMENT  10/05/2011   Procedure: Barrie Lyme IMPLANT SECOND STAGE;  Surgeon: [name], MD;  Location: [facility];  Service: Urology;  Laterality: N/A;  . INTERSTIM IMPLANT REVISION N/A 03/06/2013   Procedure: REVISION OF Barrie Lyme;  Surgeon: [name], MD;  Location: [facility];  Service: Urology;  Laterality: N/A;  .  LAPAROSCOPIC CHOLECYSTECTOMY  2001  . TONSILLECTOMY  1998  . VAGINAL HYSTERECTOMY  2003    Medications: I have reviewed the patient's current medications. Allergies:  Allergies  Allergen Reactions  . Advair Diskus [Fluticasone-Salmeterol] Shortness Of Breath  . Albuterol Sulfate Shortness Of Breath  . Atrovent Shortness Of Breath  . Bethanechol Shortness Of Breath, Rash and Other (See Comments)    RESPIRATORY DISTRESS  . Budesonide-Formoterol Fumarate Shortness Of Breath  . Influenza Virus Vaccine Split Anaphylaxis  .  Xopenex [Levalbuterol] Shortness Of Breath  . Compazine Other (See Comments)    DYSTONIC  . Fluoride Preparations   . Lorazepam Other (See Comments)    "HAS OPPOSITE EFFECT"  . Levofloxacin Rash  . Potassium-Containing Compounds Nausea And Vomiting    Just the po potassium  No problem with iv  . Zofran [Ondansetron Hcl] Rash    Family History  Problem Relation Age of Onset  . Hypertension Father   . Colon cancer Neg Hx    Social History:  reports that she has never smoked. She has never used smokeless tobacco. She reports that she does not drink alcohol or use drugs.  ROS: All systems are reviewed and negative except as noted. Rest negative  Physical Exam:  Vital signs in last 24 hours:    Cardiovascular: Skin warm; not flushed Respiratory: Breaths quiet; no shortness of breath Abdomen: No masses Neurological: Normal sensation to touch Musculoskeletal: Normal motor function arms and legs Lymphatics: No inguinal adenopathy Skin: No rashes Genitourinary:incisions ok  Laboratory Data:  No results found for this or any previous visit (from the past 72 hour(s)). No results found for this or any previous visit (from the past 240 hour(s)). Creatinine: No results for input(s): CREATININE in the last 168 hours.  Xrays: See report/chart none  Impression/Assessment:  The patient has idiopathic urinary retention that responds favorably to InterStim. Unfortunately the durability of the treatment has not been ideal. She has had pain on the left side before.  A lot of troubleshooting was done by Judson Roch as well as the [employer] representative over the phone. They rerouted one of the pathways but she could still feel in her foot. They could not get good vaginal sensation but it appears that the impedance checks were normal. She's going to try the new setting. We will then turned off and on to see if that helps. We will get an AP and lateral view of the sacrum. We will follow her by  telephone and reassess her in a few weeks.  See chart replace sns  Plan:  After a thorough review of the management options for the patient's condition the patient  elected to proceed with surgical therapy as noted above. We have discussed the potential benefits and risks of the procedure, side effects of the proposed treatment, the likelihood of the patient achieving the goals of the procedure, and any potential problems that might occur during the procedure or recuperation. Informed consent has been obtained.  Dennice Tindol A 11/10/2016, 8:05 AM

## 2016-11-13 ENCOUNTER — Encounter (HOSPITAL_BASED_OUTPATIENT_CLINIC_OR_DEPARTMENT_OTHER)
Admission: RE | Disposition: A | Discharge status: Home / Self Care | Payer: Self-pay | Source: Ambulatory Visit | Attending: Urology

## 2016-11-13 ENCOUNTER — Ambulatory Visit (HOSPITAL_BASED_OUTPATIENT_CLINIC_OR_DEPARTMENT_OTHER): Payer: Managed Care, Other (non HMO) | Admitting: Anesthesiology

## 2016-11-13 ENCOUNTER — Ambulatory Visit (HOSPITAL_BASED_OUTPATIENT_CLINIC_OR_DEPARTMENT_OTHER)
Admission: RE | Admit: 2016-11-13 | Discharge: 2016-11-13 | Disposition: A | Discharge status: Home / Self Care | Payer: Managed Care, Other (non HMO) | Source: Ambulatory Visit | Attending: Urology | Admitting: Urology

## 2016-11-13 ENCOUNTER — Encounter (HOSPITAL_BASED_OUTPATIENT_CLINIC_OR_DEPARTMENT_OTHER): Payer: Self-pay

## 2016-11-13 ENCOUNTER — Ambulatory Visit (HOSPITAL_COMMUNITY): Payer: Managed Care, Other (non HMO)

## 2016-11-13 DIAGNOSIS — Z8249 Family history of ischemic heart disease and other diseases of the circulatory system: Secondary | ICD-10-CM | POA: Insufficient documentation

## 2016-11-13 DIAGNOSIS — M79671 Pain in right foot: Secondary | ICD-10-CM | POA: Diagnosis not present

## 2016-11-13 DIAGNOSIS — Z90710 Acquired absence of both cervix and uterus: Secondary | ICD-10-CM | POA: Insufficient documentation

## 2016-11-13 DIAGNOSIS — Z9049 Acquired absence of other specified parts of digestive tract: Secondary | ICD-10-CM | POA: Diagnosis not present

## 2016-11-13 DIAGNOSIS — N318 Other neuromuscular dysfunction of bladder: Secondary | ICD-10-CM | POA: Diagnosis not present

## 2016-11-13 DIAGNOSIS — Z887 Allergy status to serum and vaccine status: Secondary | ICD-10-CM | POA: Diagnosis not present

## 2016-11-13 DIAGNOSIS — E876 Hypokalemia: Secondary | ICD-10-CM | POA: Insufficient documentation

## 2016-11-13 DIAGNOSIS — I471 Supraventricular tachycardia: Secondary | ICD-10-CM | POA: Insufficient documentation

## 2016-11-13 DIAGNOSIS — J45909 Unspecified asthma, uncomplicated: Secondary | ICD-10-CM | POA: Insufficient documentation

## 2016-11-13 DIAGNOSIS — Z87440 Personal history of urinary (tract) infections: Secondary | ICD-10-CM | POA: Insufficient documentation

## 2016-11-13 DIAGNOSIS — K219 Gastro-esophageal reflux disease without esophagitis: Secondary | ICD-10-CM | POA: Diagnosis not present

## 2016-11-13 DIAGNOSIS — T85191A Other mechanical complication of implanted electronic neurostimulator (electrode) of peripheral nerve, initial encounter: Secondary | ICD-10-CM | POA: Insufficient documentation

## 2016-11-13 DIAGNOSIS — R339 Retention of urine, unspecified: Secondary | ICD-10-CM | POA: Insufficient documentation

## 2016-11-13 DIAGNOSIS — Z888 Allergy status to other drugs, medicaments and biological substances status: Secondary | ICD-10-CM | POA: Diagnosis not present

## 2016-11-13 DIAGNOSIS — G894 Chronic pain syndrome: Secondary | ICD-10-CM | POA: Diagnosis not present

## 2016-11-13 DIAGNOSIS — X58XXXS Exposure to other specified factors, sequela: Secondary | ICD-10-CM | POA: Diagnosis not present

## 2016-11-13 DIAGNOSIS — E039 Hypothyroidism, unspecified: Secondary | ICD-10-CM | POA: Insufficient documentation

## 2016-11-13 HISTORY — DX: Hypomagnesemia: E83.42

## 2016-11-13 HISTORY — DX: Other mechanical complication of other specified internal prosthetic devices, implants and grafts, initial encounter: T85.698A

## 2016-11-13 HISTORY — DX: Neuromuscular dysfunction of bladder, unspecified: N31.9

## 2016-11-13 HISTORY — DX: Disorders of magnesium metabolism, unspecified: E83.40

## 2016-11-13 HISTORY — DX: Hypokalemia: E87.6

## 2016-11-13 HISTORY — DX: Chronic pain syndrome: G89.4

## 2016-11-13 HISTORY — DX: Benign neoplasm of right adrenal gland: D35.01

## 2016-11-13 HISTORY — DX: Presence of spectacles and contact lenses: Z97.3

## 2016-11-13 HISTORY — DX: Breakdown (mechanical) of other specified internal prosthetic devices, implants and grafts, initial encounter: T85.618A

## 2016-11-13 HISTORY — PX: INTERSTIM IMPLANT REMOVAL: SHX5131

## 2016-11-13 HISTORY — PX: INTERSTIM IMPLANT PLACEMENT: SHX5130

## 2016-11-13 LAB — POCT I-STAT, CHEM 8
BUN: 14 mg/dL (ref 6–20)
CHLORIDE: 104 mmol/L (ref 101–111)
CREATININE: 0.7 mg/dL (ref 0.44–1.00)
Calcium, Ion: 1.28 mmol/L (ref 1.15–1.40)
GLUCOSE: 74 mg/dL (ref 65–99)
HCT: 43 % (ref 36.0–46.0)
HEMOGLOBIN: 14.6 g/dL (ref 12.0–15.0)
POTASSIUM: 2.7 mmol/L — AB (ref 3.5–5.1)
Sodium: 144 mmol/L (ref 135–145)
TCO2: 27 mmol/L (ref 0–100)

## 2016-11-13 SURGERY — REMOVAL, NEUROSTIMULATOR, SACRAL
Anesthesia: Monitor Anesthesia Care

## 2016-11-13 MED ORDER — LIDOCAINE-EPINEPHRINE (PF) 1 %-1:200000 IJ SOLN
INTRAMUSCULAR | Status: AC
  Filled 2016-11-13: qty 30

## 2016-11-13 MED ORDER — CEPHALEXIN 500 MG PO CAPS: 500.0000 mg | ORAL_CAPSULE | Freq: Three times a day (TID) | ORAL | 0 refills | Status: DC

## 2016-11-13 MED ORDER — FENTANYL CITRATE (PF) 100 MCG/2ML IJ SOLN
INTRAMUSCULAR | Status: AC
  Filled 2016-11-13: qty 2

## 2016-11-13 MED ORDER — LIDOCAINE-EPINEPHRINE (PF) 1 %-1:200000 IJ SOLN
INTRAMUSCULAR | Status: DC | PRN
  Administered 2016-11-13: 7 mL

## 2016-11-13 MED ORDER — MIDAZOLAM HCL 2 MG/2ML IJ SOLN
INTRAMUSCULAR | Status: AC
  Filled 2016-11-13: qty 2

## 2016-11-13 MED ORDER — PROPOFOL 500 MG/50ML IV EMUL
INTRAVENOUS | Status: DC | PRN
  Administered 2016-11-13: 200 ug/kg/min via INTRAVENOUS

## 2016-11-13 MED ORDER — KETOROLAC TROMETHAMINE 30 MG/ML IJ SOLN
INTRAMUSCULAR | Status: AC
  Filled 2016-11-13: qty 1

## 2016-11-13 MED ORDER — FENTANYL CITRATE (PF) 100 MCG/2ML IJ SOLN
INTRAMUSCULAR | Status: DC | PRN
  Administered 2016-11-13: 25 ug via INTRAVENOUS
  Administered 2016-11-13: 50 ug via INTRAVENOUS
  Administered 2016-11-13: 50 ug
  Administered 2016-11-13 (×2): 50 ug via INTRAVENOUS
  Administered 2016-11-13: 25 ug via INTRAVENOUS

## 2016-11-13 MED ORDER — PROPOFOL 500 MG/50ML IV EMUL
INTRAVENOUS | Status: AC
  Filled 2016-11-13: qty 50

## 2016-11-13 MED ORDER — BUPIVACAINE-EPINEPHRINE 0.5% -1:200000 IJ SOLN
INTRAMUSCULAR | Status: DC | PRN
  Administered 2016-11-13: 7 mL

## 2016-11-13 MED ORDER — CEFAZOLIN SODIUM-DEXTROSE 2-4 GM/100ML-% IV SOLN
INTRAVENOUS | Status: AC
Start: 2016-11-13 — End: 2016-11-13
  Filled 2016-11-13: qty 100

## 2016-11-13 MED ORDER — HYDROMORPHONE HCL 1 MG/ML IJ SOLN
INTRAMUSCULAR | Status: DC | PRN
  Administered 2016-11-13: 1 mg via INTRAVENOUS

## 2016-11-13 MED ORDER — BUPIVACAINE-EPINEPHRINE (PF) 0.5% -1:200000 IJ SOLN
INTRAMUSCULAR | Status: AC
  Filled 2016-11-13: qty 30

## 2016-11-13 MED ORDER — SODIUM CHLORIDE 0.9 % IV SOLN
INTRAVENOUS | Status: DC | PRN
  Administered 2016-11-13: 20 ug/kg/min via INTRAVENOUS

## 2016-11-13 MED ORDER — WHITE PETROLATUM GEL
Status: AC
  Filled 2016-11-13: qty 5

## 2016-11-13 MED ORDER — KETAMINE HCL 10 MG/ML IJ SOLN
INTRAMUSCULAR | Status: AC
  Filled 2016-11-13: qty 1

## 2016-11-13 MED ORDER — CEFAZOLIN SODIUM-DEXTROSE 2-4 GM/100ML-% IV SOLN
2.0000 g | INTRAVENOUS | Status: AC
  Administered 2016-11-13: 2 g via INTRAVENOUS
  Filled 2016-11-13: qty 100

## 2016-11-13 MED ORDER — HYDROMORPHONE HCL 1 MG/ML IJ SOLN
INTRAMUSCULAR | Status: AC
  Filled 2016-11-13: qty 1

## 2016-11-13 MED ORDER — DEXAMETHASONE SODIUM PHOSPHATE 4 MG/ML IJ SOLN
INTRAMUSCULAR | Status: DC | PRN
  Administered 2016-11-13 (×2): 5 mg via INTRAVENOUS

## 2016-11-13 MED ORDER — MIDAZOLAM HCL 5 MG/5ML IJ SOLN
INTRAMUSCULAR | Status: DC | PRN
  Administered 2016-11-13: 2 mg via INTRAVENOUS
  Administered 2016-11-13 (×2): .5 mg via INTRAVENOUS
  Administered 2016-11-13: 1 mg via INTRAVENOUS

## 2016-11-13 MED ORDER — FENTANYL CITRATE (PF) 100 MCG/2ML IJ SOLN
25.0000 ug | INTRAMUSCULAR | Status: DC | PRN
  Administered 2016-11-13: 50 ug via INTRAVENOUS
  Filled 2016-11-13: qty 1

## 2016-11-13 MED ORDER — SODIUM CHLORIDE 0.9 % IV SOLN
INTRAVENOUS | Status: DC
  Administered 2016-11-13 (×2): via INTRAVENOUS
  Filled 2016-11-13: qty 1000

## 2016-11-13 MED ORDER — PROMETHAZINE HCL 25 MG/ML IJ SOLN
6.2500 mg | INTRAMUSCULAR | Status: DC | PRN
Start: 2016-11-13 — End: 2016-11-13
  Filled 2016-11-13: qty 1

## 2016-11-13 SURGICAL SUPPLY — 58 items
BLADE SURG 15 STRL LF DISP TIS (BLADE) ×1 IMPLANT
BLADE SURG 15 STRL SS (BLADE) ×2
CABLE TEST STIMULATION (UROLOGICAL SUPPLIES) ×3 IMPLANT
CLOTH BEACON ORANGE TIMEOUT ST (SAFETY) ×3 IMPLANT
COVER BACK TABLE 60X90IN (DRAPES) ×3 IMPLANT
COVER MAYO STAND STRL (DRAPES) ×3 IMPLANT
COVER PROBE W GEL 5X96 (DRAPES) ×3 IMPLANT
DERMABOND ADVANCED (GAUZE/BANDAGES/DRESSINGS) ×2
DERMABOND ADVANCED .7 DNX12 (GAUZE/BANDAGES/DRESSINGS) ×1 IMPLANT
DRAPE C-ARM 42X72 X-RAY (DRAPES) ×3 IMPLANT
DRAPE INCISE 23X17 IOBAN STRL (DRAPES) ×2
DRAPE INCISE IOBAN 23X17 STRL (DRAPES) ×1 IMPLANT
DRAPE INCISE IOBAN 66X45 STRL (DRAPES) ×3 IMPLANT
DRAPE LAPAROSCOPIC ABDOMINAL (DRAPES) ×3 IMPLANT
DRSG COVADERM PLUS 2X2 (GAUZE/BANDAGES/DRESSINGS) IMPLANT
DRSG TEGADERM 4X4.75 (GAUZE/BANDAGES/DRESSINGS) ×3 IMPLANT
DRSG TELFA 3X8 NADH (GAUZE/BANDAGES/DRESSINGS) ×3 IMPLANT
ELECT REM PT RETURN 9FT ADLT (ELECTROSURGICAL) ×3
ELECTRODE REM PT RTRN 9FT ADLT (ELECTROSURGICAL) ×1 IMPLANT
GAUZE SPONGE 4X4 12PLY STRL LF (GAUZE/BANDAGES/DRESSINGS) ×3 IMPLANT
GLOVE BIO SURGEON STRL SZ7.5 (GLOVE) ×6 IMPLANT
GLOVE BIOGEL PI IND STRL 6.5 (GLOVE) ×3 IMPLANT
GLOVE BIOGEL PI INDICATOR 6.5 (GLOVE) ×6
GLOVE ECLIPSE 7.5 STRL STRAW (GLOVE) ×6 IMPLANT
GLOVE INDICATOR 8.0 STRL GRN (GLOVE) ×3 IMPLANT
GOWN STRL REUS W/ TWL LRG LVL3 (GOWN DISPOSABLE) ×3 IMPLANT
GOWN STRL REUS W/ TWL XL LVL3 (GOWN DISPOSABLE) ×1 IMPLANT
GOWN STRL REUS W/TWL LRG LVL3 (GOWN DISPOSABLE) ×6
GOWN STRL REUS W/TWL XL LVL3 (GOWN DISPOSABLE) ×2
INTRODUCER GUIDE DILATR SHEATH (SET/KITS/TRAYS/PACK) ×3 IMPLANT
KIT INTERSTIM LEAD TINED 28CM (Urological Implant) ×3 IMPLANT
KIT RM TURNOVER CYSTO AR (KITS) ×3 IMPLANT
MANIFOLD NEPTUNE II (INSTRUMENTS) IMPLANT
NEEDLE FORAMEN 20GA 3.5  9CM (NEEDLE) ×3 IMPLANT
NEEDLE FORAMEN 20GA 5  12.5CM (NEEDLE) ×3 IMPLANT
NEEDLE HYPO 22GX1.5 SAFETY (NEEDLE) ×3 IMPLANT
NEUROSTIMULATOR 1.7X2X.06 (UROLOGICAL SUPPLIES) ×3 IMPLANT
NS IRRIG 500ML POUR BTL (IV SOLUTION) IMPLANT
PACK BASIN DAY SURGERY FS (CUSTOM PROCEDURE TRAY) ×3 IMPLANT
PENCIL BUTTON HOLSTER BLD 10FT (ELECTRODE) ×3 IMPLANT
PROGRAMMER ANTENNA EXT (UROLOGICAL SUPPLIES) ×3 IMPLANT
PROGRAMMER STIMUL 2.2X1.1X3.7 (UROLOGICAL SUPPLIES) ×3 IMPLANT
SPONGE GAUZE 4X4 12PLY STER LF (GAUZE/BANDAGES/DRESSINGS) ×3 IMPLANT
STAPLER VISISTAT 35W (STAPLE) IMPLANT
STIMULATOR INTERSTIM 2X1.7X.3 (Orthopedic Implant) ×3 IMPLANT
SUT VIC AB 2-0 UR5 27 (SUTURE) IMPLANT
SUT VIC AB 3-0 SH 27 (SUTURE) ×4
SUT VIC AB 3-0 SH 27X BRD (SUTURE) ×2 IMPLANT
SUT VIC AB 4-0 RB1 27 (SUTURE) ×6
SUT VIC AB 4-0 RB1 27X BRD (SUTURE) ×3 IMPLANT
SUT VICRYL 4-0 PS2 18IN ABS (SUTURE) ×6 IMPLANT
SYR BULB IRRIGATION 50ML (SYRINGE) ×3 IMPLANT
SYR CONTROL 10ML LL (SYRINGE) ×3 IMPLANT
TOWEL OR 17X24 6PK STRL BLUE (TOWEL DISPOSABLE) ×6 IMPLANT
TRAY DSU PREP LF (CUSTOM PROCEDURE TRAY) ×3 IMPLANT
TUBE CONNECTING 12'X1/4 (SUCTIONS) ×1
TUBE CONNECTING 12X1/4 (SUCTIONS) ×2 IMPLANT
WATER STERILE IRR 500ML POUR (IV SOLUTION) ×3 IMPLANT

## 2016-11-13 NOTE — Anesthesia Postprocedure Evaluation (Signed)
Anesthesia Post Note  Patient: Kayla Curry  Procedure(s) Performed: Procedure(s) (LRB): REMOVAL OF INTERSTIM IMPLANT ONE AND TWO (N/A) INTERSTIM IMPLANT FIRST STAGE (N/A) INTERSTIM IMPLANT SECOND STAGE (N/A)  Patient location during evaluation: PACU Anesthesia Type: MAC Level of consciousness: awake and alert Pain management: pain level controlled Vital Signs Assessment: post-procedure vital signs reviewed and stable Respiratory status: spontaneous breathing, nonlabored ventilation, respiratory function stable and patient connected to nasal cannula oxygen Cardiovascular status: stable and blood pressure returned to baseline Anesthetic complications: no       Last Vitals:  Vitals:   11/13/16 1300 11/13/16 1315  BP: 119/63 (!) 120/57  Pulse: 78 79  Resp: 12 13  Temp:      Last Pain:  Vitals:   11/13/16 1315  TempSrc:   PainSc: 5                  Tramar Brueckner S

## 2016-11-13 NOTE — Op Note (Signed)
Preoperative diagnosis: Malfunctioning InterStim and urinary retention Postoperative diagnosis: Malfunctioning InterStim and urinary retention Surgery: Removal of InterStim; stage I and stage II placement of InterStim; impedance check Surgeon: Dr. Nicki Reaper Maeci Kalbfleisch Asst.: Dr. Cordelia Poche  The patient has the above diagnoses and consented above procedure. Preoperative antibiotics were given. Usual skin preparation was utilized. Extra care was taken with positioning  After draping the patient we instilled 10 mL of a lidocaine epinephrine mixture at the level of the right upper buttock incision where the IPG was located. Approximate 4 cm incision was made and carried down entering the pseudocapsule delivering the IPG. The lead was cut and hemostat was placed  Under fluoroscopic guidance and direct vision one could see where the lead was medially. 2 mL of contrast epinephrine lidocaine mixture was utilized. 2 cm incision was made and carried down through scar easily delivering the lead lateral to medial.  The patient was very thin was easy to free up any soft tissue adherence to the lead. I hemostat was placed on the lead at the level of bone table. I pulled back an upward fashion and removed the lead in total  3.5 inch foramen needle was easily placed in the S3 foramina. We took approximately 30 minutes trying different angles and we left it where she had an excellent toe and bellows response. I even checked S4 and there was a great bellow but no toe response.  The stylet was placed to the appropriate depth and the lead was removed. The white trocar was applied to the appropriate depth removing the introducer. Inner sheath was removed. Well-prepared InterStim lead with the stylette and an angled tip was placed to the correct level utilizing fluoroscopy. Sheath was drawn back to the second white line. She had excellent bellows and toe response in lead position 0,1 and 2 and a toe response in 3. Under  fluoroscopic guidance the white sheath and wire was disengaged. We checked the position of the lead again and took x-rays and checked for motor responses reduplicated  A few cc of a lidocaine epinephrine mixture was placed across the tissue bridge I delivered the lead from medial to lateral using the lead passer. She was very petite and there was not much distance. The lead was applied to the IPG with the described technique utilizing a screwdriver. It was placed back in the pocket and within the pseudocapsule with the wire underneath the IPG.  Utilizing sterile technique there was an impedance check which was normal in all 4-lead positions  The incision was tension-free and closed with running 3-0 Vicryl subcutaneous changes tissue followed by 4-0 subcuticular. The same was used for the short midline incision. Sterile dressing was applied.  I was very pleased with the motor responses intraoperatively. Hopefully the procedure will help the patient as described. I did not want to chance placing it in S4 for retention

## 2016-11-13 NOTE — Discharge Instructions (Signed)
I have reviewed discharge instructions in detail with the patient. They will follow-up with me or their physician as scheduled. My nurse will also be calling the patients as per protocol.     Post Anesthesia Home Care Instructions  Activity: Get plenty of rest for the remainder of the day. A responsible individual must stay with you for 24 hours following the procedure.  For the next 24 hours, DO NOT: -Drive a car -Operate machinery -Drink alcoholic beverages -Take any medication unless instructed by your physician -Make any legal decisions or sign important papers.  Meals: Start with liquid foods such as gelatin or soup. Progress to regular foods as tolerated. Avoid greasy, spicy, heavy foods. If nausea and/or vomiting occur, drink only clear liquids until the nausea and/or vomiting subsides. Call your physician if vomiting continues.  Special Instructions/Symptoms: Your throat may feel dry or sore from the anesthesia or the breathing tube placed in your throat during surgery. If this causes discomfort, gargle with warm salt water. The discomfort should disappear within 24 hours.  If you had a scopolamine patch placed behind your ear for the management of post- operative nausea and/or vomiting:  1. The medication in the patch is effective for 72 hours, after which it should be removed.  Wrap patch in a tissue and discard in the trash. Wash hands thoroughly with soap and water. 2. You may remove the patch earlier than 72 hours if you experience unpleasant side effects which may include dry mouth, dizziness or visual disturbances. 3. Avoid touching the patch. Wash your hands with soap and water after contact with the patch.     

## 2016-11-13 NOTE — Progress Notes (Signed)
Dr. Matilde Sprang in and aware of spontaneous void 643ml clear yellow urine and post void residual 4ml. [place] for discharge.

## 2016-11-13 NOTE — Interval H&P Note (Signed)
History and Physical Interval Note:  11/13/2016 10:35 AM  Kayla Curry  has presented today for surgery, with the diagnosis of MALFUNCTIONING INTERSTIM  The various methods of treatment have been discussed with the patient and family. After consideration of risks, benefits and other options for treatment, the patient has consented to  Procedure(s): REMOVAL OF INTERSTIM IMPLANT ONE AND TWO (N/A) INTERSTIM IMPLANT FIRST STAGE (N/A) INTERSTIM IMPLANT SECOND STAGE (N/A) as a surgical intervention .  The patient's history has been reviewed, patient examined, no change in status, stable for surgery.  I have reviewed the patient's chart and labs.  Questions were answered to the patient's satisfaction.     Dillian Feig A

## 2016-11-13 NOTE — Anesthesia Procedure Notes (Signed)
Procedure Name: MAC Date/Time: 11/13/2016 10:50 AM Performed by: Wanita Chamberlain Pre-anesthesia Checklist: Patient identified, Timeout performed, Emergency Drugs available, Suction available and Patient being monitored Patient Re-evaluated:Patient Re-evaluated prior to inductionOxygen Delivery Method: Nasal cannula Preoxygenation: Pre-oxygenation with 100% oxygen Intubation Type: IV induction Placement Confirmation: positive ETCO2 and CO2 detector Dental Injury: Teeth and Oropharynx as per pre-operative assessment

## 2016-11-13 NOTE — Transfer of Care (Signed)
Immediate Anesthesia Transfer of Care Note  Patient: Kayla Curry  Procedure(s) Performed: Procedure(s): REMOVAL OF INTERSTIM IMPLANT ONE AND TWO (N/A) INTERSTIM IMPLANT FIRST STAGE (N/A) INTERSTIM IMPLANT SECOND STAGE (N/A)  Patient Location: PACU  Anesthesia Type:MAC  Level of Consciousness: awake, alert , oriented and patient cooperative  Airway & Oxygen Therapy: Patient Spontanous Breathing and Patient connected to nasal cannula oxygen  Post-op Assessment: Report given to RN and Post -op Vital signs reviewed and stable  Post vital signs: Reviewed and stable  Last Vitals:  Vitals:   11/13/16 0746  BP: 135/70  Pulse: 77  Resp: 14  Temp: 36.9 C    Last Pain:  Vitals:   11/13/16 0803  TempSrc:   PainSc: 3       Patients Stated Pain Goal: 7 (35/32/99 2426)  Complications: No apparent anesthesia complications

## 2016-11-13 NOTE — Anesthesia Preprocedure Evaluation (Addendum)
Anesthesia Evaluation  Patient identified by MRN, date of birth, ID band Patient awake    Reviewed: Allergy & Precautions, NPO status , Patient's Chart, lab work & pertinent test results  Airway Mallampati: II  TM Distance: >3 FB Neck ROM: Full    Dental no notable dental hx. (+) Teeth Intact, Dental Advisory Given   Pulmonary neg pulmonary ROS, asthma ,    Pulmonary exam normal breath sounds clear to auscultation       Cardiovascular negative cardio ROS Normal cardiovascular exam Rhythm:Regular Rate:Normal     Neuro/Psych Anxiety negative neurological ROS  negative psych ROS   GI/Hepatic negative GI ROS, Neg liver ROS, GERD  Medicated and Controlled,  Endo/Other  Hypothyroidism Chronic hypokalemia  Renal/GU Renal diseasenegative Renal ROS  negative genitourinary   Musculoskeletal negative musculoskeletal ROS (+)   Abdominal   Peds negative pediatric ROS (+)  Hematology negative hematology ROS (+)   Anesthesia Other Findings   Reproductive/Obstetrics negative OB ROS                           Anesthesia Physical Anesthesia Plan  ASA: III  Anesthesia Plan: MAC   Post-op Pain Management:    Induction: Intravenous  Airway Management Planned: Simple Face Mask  Additional Equipment:   Intra-op Plan:   Post-operative Plan:   Informed Consent: I have reviewed the patients History and Physical, chart, labs and discussed the procedure including the risks, benefits and alternatives for the proposed anesthesia with the patient or authorized representative who has indicated his/her understanding and acceptance.   Dental advisory given  Plan Discussed with: CRNA and Surgeon  Anesthesia Plan Comments:         Anesthesia Quick Evaluation

## 2016-11-14 ENCOUNTER — Encounter (HOSPITAL_BASED_OUTPATIENT_CLINIC_OR_DEPARTMENT_OTHER): Payer: Self-pay | Admitting: Urology

## 2016-12-04 ENCOUNTER — Encounter: Payer: Self-pay | Admitting: Urology

## 2016-12-04 ENCOUNTER — Ambulatory Visit (INDEPENDENT_AMBULATORY_CARE_PROVIDER_SITE_OTHER): Payer: Managed Care, Other (non HMO) | Admitting: Urology

## 2016-12-04 ENCOUNTER — Other Ambulatory Visit: Payer: Self-pay | Admitting: Family Medicine

## 2016-12-04 VITALS — BP 150/77 | HR 98 | Temp 98.5°F | Ht 62.0 in | Wt 108.9 lb

## 2016-12-04 DIAGNOSIS — N319 Neuromuscular dysfunction of bladder, unspecified: Secondary | ICD-10-CM

## 2016-12-04 MED ORDER — CEPHALEXIN 500 MG PO CAPS: 500.0000 mg | ORAL_CAPSULE | Freq: Three times a day (TID) | ORAL | 0 refills | Status: AC

## 2016-12-04 NOTE — Progress Notes (Signed)
12/04/2016 4:29 PM   Geroge Curry Oct 27, 1970 [id number]  Referring provider: [name], MD [address] [place], [place] [address]  Chief Complaint  Patient presents with  . Post-op Problem    pain at the incision site     HPI: The patient has incomplete bladder emptying. Similar to last time we decided to replace the entire implant but this time the IPG as well. We talked about retained tip and the chances of needing to use the left side if needed intraoperatively. We both recognize she has had pain on the left side before. She understands increased risk of infection and other complications. Watchful waiting and clean intermittent catheterization as an option was discussed. She will be called and I will schedule my clinic to try to accommodate surgery soon as possible  The patient had her InterStim replaced recently for urinary retention.    Today The patient is voiding well. She recently was away traveling and in the last 3 days has had redness and soreness over the middle right incision. She has not had fever or discharge  Modifying factors: There are no other modifying factors  Associated signs and symptoms: There are no other associated signs and symptoms Aggravating and relieving factors: There are no other aggravating or relieving factors Severity: Moderate Duration: Persistent  On physical examination she had approximately a 2.5 cm x 1 cm raised red area involving the right midline incision. It was tender. Some of the sutures were still present and dissolving. She had no other tender areas or erythematous areas and the IPG was nontender and looked normal and was palpably normal   PMH: Past Medical History:  Diagnosis Date  . Adrenal adenoma, right   . Chronic pain syndrome   . Gitelman syndrome    DISORDER MAGNESIUM METABOLISM  . History of pelvic fracture   . History of supraventricular tachycardia    PER PT ON 02-28-2013 NO ISSUES SINCE  ABLATION IN 2005  . Hypokalemia    CHRONIC  . Hypothyroidism   . 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 Magnesium metabolism disorder NEPHROLOGIST-  DR [name] ([facility] IN [place])   [place]  . Malfunction of device    INTERSTIM  . Neurogenic bladder   . S/P ablation of ventricular arrhythmia HX SVT--  2005   [facility]--  LAST VISIT 2012 -- PT RELEASED FROM CARE ON PRN BASIS  . Self-catheterizes urinary bladder   . Urinary retention    RESIDUAL FROM PELVIC FX INJURY---Self cath TID and prn  . Wears contact lenses     Surgical History: Past Surgical History:  Procedure Laterality Date  . APPENDECTOMY  1994   EXPL. LAP.  Marland Kitchen CARDIAC ELECTROPHYSIOLOGY STUDY AND ABLATION  2005- AT [facility]   SVT--  PT STATES NO ISSUES SINCE   . INTERSTIM IMPLANT PLACEMENT  [facility]  . INTERSTIM IMPLANT PLACEMENT  10/05/2011   Procedure: Barrie Lyme IMPLANT FIRST STAGE;  Surgeon: [name], MD;  Location: [facility];  Service: Urology;  Laterality: N/A;  . INTERSTIM IMPLANT PLACEMENT  10/05/2011   Procedure: Barrie Lyme IMPLANT SECOND STAGE;  Surgeon: [name], MD;  Location: [facility];  Service: Urology;  Laterality: N/A;  . INTERSTIM IMPLANT PLACEMENT N/A 11/13/2016   Procedure: Barrie Lyme IMPLANT FIRST STAGE;  Surgeon: [name], MD;  Location[facility];  Service: Urology;  Laterality: N/A;  . INTERSTIM IMPLANT PLACEMENT N/A 11/13/2016   Procedure: Barrie Lyme IMPLANT SECOND STAGE;  Surgeon: [name], MD;  Location: [facility];  Service: Urology;  Laterality: N/A;  . INTERSTIM IMPLANT REMOVAL N/A 11/13/2016   Procedure: REMOVAL OF INTERSTIM IMPLANT ONE AND TWO;  Surgeon: [name], MD;  Location[facility]  Service: Urology;  Laterality: N/A;  . INTERSTIM IMPLANT REVISION  N/A 03/06/2013   Procedure: REVISION OF Barrie Lyme;  Surgeon: [name], MD;  Location: [facility];  Service: Urology;  Laterality: N/A;  . LAPAROSCOPIC CHOLECYSTECTOMY  2001  . TONSILLECTOMY  1998  . VAGINAL HYSTERECTOMY  2003    Home Medications:  Allergies as of 12/04/2016      Reactions   Advair Diskus [fluticasone-salmeterol] Shortness Of Breath   Albuterol Sulfate Shortness Of Breath   Atrovent Shortness Of Breath   Bethanechol Shortness Of Breath, Rash, Other (See Comments)   RESPIRATORY DISTRESS   Budesonide-formoterol Fumarate Shortness Of Breath   Influenza Virus Vaccine Split Anaphylaxis   Xopenex [levalbuterol] Shortness Of Breath   Compazine Other (See Comments)   DYSTONIC   Fluoride Preparations    Lorazepam Other (See Comments)   "HAS OPPOSITE EFFECT"   Levofloxacin Rash   Potassium-containing Compounds Nausea And Vomiting   Just the po potassium  No problem with iv   Zofran [ondansetron Hcl] Rash      Medication List       Accurate as of 12/04/16  4:29 PM. Always use your most recent med list.          baclofen 20 MG tablet Commonly known as:  LIORESAL TAKE 1 TABLET (20 MG TOTAL) BY MOUTH 4 (FOUR) TIMES DAILY.   cephALEXin 500 MG capsule Commonly known as:  KEFLEX Take 1 capsule (500 mg total) by mouth 3 (three) times daily.   EPIPEN 2-PAK 0.3 mg/0.3 mL Soaj injection Generic drug:  EPINEPHrine Inject into the muscle.   esomeprazole 40 MG capsule Commonly known as:  NEXIUM Take 1 capsule (40 mg total) by mouth daily before breakfast.   gabapentin 600 MG tablet Commonly known as:  NEURONTIN TAKE 1 TABLET (600 MG TOTAL) BY MOUTH 3 (THREE) TIMES DAILY.   metaproterenol 20 MG tablet Commonly known as:  ALUPENT Take 1 tablet (20 mg total) by mouth 3 (three) times daily as needed.   oxyCODONE-acetaminophen 7.5-325 MG tablet Commonly known as:  PERCOCET One tablet every 4-5 hours as needed   Potassium 99 MG Tabs Take 1  tablet by mouth 2 (two) times daily.   promethazine 25 MG tablet Commonly known as:  PHENERGAN Take 1 tablet (25 mg total) by mouth every 6 (six) hours as needed for nausea.   spironolactone 100 MG tablet Commonly known as:  ALDACTONE TAKE 1 TABLET (100 MG TOTAL) BY MOUTH 2 (TWO) TIMES DAILY.   SYNTHROID 75 MCG tablet Generic drug:  levothyroxine TAKE 1 TABLET BY MOUTH DAILY. PLEASE SCHEDULE OFFICE VISIT FOR FOLLOWUP   tiZANidine 4 MG tablet Commonly known as:  ZANAFLEX TAKE 1 TABLET (4 MG TOTAL) BY MOUTH 2 (TWO) TIMES DAILY.       Allergies:  Allergies  Allergen Reactions  . Advair Diskus [Fluticasone-Salmeterol] Shortness Of Breath  . Albuterol Sulfate Shortness Of Breath  . Atrovent Shortness Of Breath  . Bethanechol Shortness Of Breath, Rash and Other (See Comments)    RESPIRATORY DISTRESS  . Budesonide-Formoterol Fumarate Shortness Of Breath  . Influenza Virus  Vaccine Split Anaphylaxis  . Xopenex [Levalbuterol] Shortness Of Breath  . Compazine Other (See Comments)    DYSTONIC  . Fluoride Preparations   . Lorazepam Other (See Comments)    "HAS OPPOSITE EFFECT"  . Levofloxacin Rash  . Potassium-Containing Compounds Nausea And Vomiting    Just the po potassium  No problem with iv  . Zofran [Ondansetron Hcl] Rash    Family History: Family History  Problem Relation Age of Onset  . Hypertension Father   . Colon cancer Neg Hx     Social History:  reports that she has never smoked. She has never used smokeless tobacco. She reports that she does not drink alcohol or use drugs.  ROS: UROLOGY Frequent Urination?: No Hard to postpone urination?: No Burning/pain with urination?: No Get up at night to urinate?: No Leakage of urine?: No Urine stream starts and stops?: No Trouble starting stream?: No Do you have to strain to urinate?: No Blood in urine?: No Urinary tract infection?: No Sexually transmitted disease?: No Injury to kidneys or bladder?: No Painful  intercourse?: No Weak stream?: No Currently pregnant?: No Vaginal bleeding?: No Last menstrual period?: n  Gastrointestinal Nausea?: No Vomiting?: No Indigestion/heartburn?: No Diarrhea?: No Constipation?: No  Constitutional Fever: No Night sweats?: No Weight loss?: No Fatigue?: No  Skin Skin rash/lesions?: No Itching?: No  Eyes Blurred vision?: No Double vision?: No  Ears/Nose/Throat Sore throat?: No Sinus problems?: No  Hematologic/Lymphatic Swollen glands?: No Easy bruising?: No  Cardiovascular Leg swelling?: No Chest pain?: No  Respiratory Cough?: No Shortness of breath?: No  Endocrine Excessive thirst?: No  Musculoskeletal Back pain?: No Joint pain?: No  Neurological Headaches?: No Dizziness?: No  Psychologic Depression?: No Anxiety?: No  Physical Exam: BP (!) 150/77   Pulse 98   Temp 98.5 F (36.9 C) (Oral)   Ht 5\' 2"  (1.575 m)   Wt 108 lb 14.4 oz (49.4 kg)   BMI 19.92 kg/m   Constitutional:  Alert and oriented, No acute distress.  Laboratory Data: Lab Results  Component Value Date   WBC 5.5 04/07/2016   HGB 14.6 11/13/2016   HCT 43.0 11/13/2016   MCV 91.1 04/07/2016   PLT 169 04/07/2016    Lab Results  Component Value Date   CREATININE 0.70 11/13/2016    No results found for: PSA  No results found for: TESTOSTERONE  No results found for: HGBA1C  Urinalysis    Component Value Date/Time   APPEARANCEUR Clear 10/10/2016 1428   GLUCOSEU Negative 10/10/2016 1428   BILIRUBINUR Negative 10/10/2016 1428   PROTEINUR Negative 10/10/2016 1428   NITRITE Negative 10/10/2016 1428   LEUKOCYTESUR Negative 10/10/2016 1428    Pertinent Imaging: None  Assessment & Plan:  I'm concerned that the patient may have an infection and is so hopefully it is superficial and not involving the lead. Having said that the lead is close to the skin via definition especially in that area. She is also quite petite.  I started her on Keflex  500 mg 3 times a day and I thought it was reasonable to prescribe it for 10 days. Hygiene was discussed but I decided not to add hot compresses since I did not want to threaten the intactness of her current incision. She understands that this could also just represent inflammation. I will like to see her in the next 2 weeks for reassessment and sooner if needed. She understands if the implant is infected that sooner or later it would likely require explantation.  She also remembers [place] discussing infection rate risks that are increased on redo procedures. She understands if she did have it removed she could have another implant in the future  There are no diagnoses linked to this encounter.  No Follow-up on file.  [name], MD  [facility] [address] [place], [place] [address] [phone]

## 2016-12-06 ENCOUNTER — Telehealth: Payer: Self-pay

## 2016-12-06 NOTE — Telephone Encounter (Signed)
Spoke with patient and explained to her that since she currently has an infection we should wait on changing settings and also so soon after replacement she could be having swelling and we need to give time for the swelling to go down. Patient will call on Monday to give an update on her symptoms, if no better she will come to the office for an impedance check.

## 2016-12-06 NOTE — Telephone Encounter (Signed)
The pt called complaining that her symptoms got worse since yesterday.She came in the office on Monday and saw Dr. Matilde Sprang who diagnosed her superficial infection at the incision site and put her on keflex.  Pt complaining that she had so much trouble voiding she had to change the setting on the interstim. I spoke w/ Judson Roch and had her to f/u with the pt.

## 2016-12-13 ENCOUNTER — Other Ambulatory Visit: Payer: Self-pay | Admitting: Family Medicine

## 2016-12-13 NOTE — Telephone Encounter (Signed)
Pt contacted office for refill request on the following medications: ° °oxyCODONE-acetaminophen (PERCOCET) 7.5-325 MG tablet  ° °CB#[phone]/MW °

## 2016-12-14 MED ORDER — OXYCODONE-ACETAMINOPHEN 7.5-325 MG PO TABS: ORAL_TABLET | ORAL | 0 refills | Status: DC

## 2016-12-15 ENCOUNTER — Ambulatory Visit (INDEPENDENT_AMBULATORY_CARE_PROVIDER_SITE_OTHER): Payer: Managed Care, Other (non HMO) | Admitting: Urology

## 2016-12-15 VITALS — BP 135/74 | HR 80 | Ht 62.0 in | Wt 108.0 lb

## 2016-12-15 DIAGNOSIS — N319 Neuromuscular dysfunction of bladder, unspecified: Secondary | ICD-10-CM

## 2016-12-16 NOTE — Progress Notes (Signed)
Nurse visit today for InterStim check.

## 2016-12-19 ENCOUNTER — Encounter: Payer: Self-pay | Admitting: Urology

## 2016-12-19 NOTE — Addendum Note (Signed)
Addended by: Tommy Rainwater on: 12/19/2016 09:49 AM   Modules accepted: Level of Service

## 2016-12-19 NOTE — Progress Notes (Signed)
Patient present today complaining of having to CIC after interstim replacement. Patient was doing well with new stimulator until last week when she started having to cath again. This was around the same time patient was seen post op and an infection at the site was noted by Dr. Matilde Sprang.  Patient was treated with abx and site appears to be healing well now, no sign of infection at the site at today's visit.    Patient states she is feeling no sensation from her box at all.  The settings on the program 2 started on 2.7. The settings were adjusted to 4.0 and patient still did not feel any sensation.  An impedance check was then done and did not show any problems. The program was then switched to program 3 and went up to 4.5 with no sensation. The medtronic rep was contacted and some different settings were tried with changing positive and negative impluse settings with still the patient not feeling sensation. Finally patient was able to feel sensation after some time with a new setting change and up to 4.7 but the sensation was in patient's right foot. This is the same presentation patient had prior to replacement surgery.   After speaking with the [employer] Rep Hinton Dyer) patient agreed to turn off her device for now giving her some time with out stimulation to see if this could help. Patient is to keep follow up with Dr. Matilde Sprang in May and we will decided wether to turn the device back on at that visit.

## 2016-12-25 ENCOUNTER — Ambulatory Visit: Payer: Managed Care, Other (non HMO) | Admitting: Urology

## 2016-12-25 ENCOUNTER — Encounter: Payer: Self-pay | Admitting: Urology

## 2016-12-25 VITALS — BP 122/73 | HR 90 | Ht 62.0 in | Wt 109.5 lb

## 2016-12-25 DIAGNOSIS — R339 Retention of urine, unspecified: Secondary | ICD-10-CM

## 2016-12-25 NOTE — Progress Notes (Signed)
12/25/2016 4:33 PM   Kayla Curry [birth year] [id number]  Referring provider: [name], [place], [place] [address]  Chief Complaint  Patient presents with  . Routine Post Op    HPI: The patient had InterStim recently for retention. She was empirically treated for a possible wound infection. There has been a lot of troubleshooting over the phone and in the office for recurrent retention. The device was turned off as part of the process. Impedance checks were normal. When the device was turned up she started to feel a sensation in her foot again. The device has been replaced twice in the face of not be a little trouble shoot the exact dysfunction in the replacement has been successful. She has had pain on the left side before.  On April 16 she had a 2.5 cm x 1 cm raised area involving the right midline incision that was a bit tender. Some of the sutures were still present. The IPG was normal. My concerns were noted and I started her on Keflex  Today Incomplete bladder emptying is stable. Device is turned off. Clinically noninfected     PMH: Past Medical History:  Diagnosis Date  . Adrenal adenoma, right   . Chronic pain syndrome   . Gitelman syndrome    DISORDER MAGNESIUM METABOLISM  . History of pelvic fracture   . History of supraventricular tachycardia    PER PT ON 02-28-2013 NO ISSUES SINCE ABLATION IN 2005  . Hypokalemia    CHRONIC  . Hypothyroidism   . 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 Magnesium metabolism disorder NEPHROLOGIST-  DR [name] ([facility] IN [place])   [place]  . Malfunction of device    INTERSTIM  . Neurogenic bladder   . S/P ablation of ventricular arrhythmia HX SVT--  2005   [place]--  LAST VISIT 2012 -- PT RELEASED FROM CARE ON PRN BASIS  . Self-catheterizes urinary bladder   . Urinary retention    RESIDUAL FROM PELVIC FX INJURY---Self cath TID and prn  . Wears contact lenses     Surgical History: Past Surgical History:  Procedure Laterality Date  . APPENDECTOMY  1994   EXPL. LAP.  Marland Kitchen CARDIAC ELECTROPHYSIOLOGY STUDY AND ABLATION  2005- AT [facility]   SVT--  PT STATES NO ISSUES SINCE   . INTERSTIM IMPLANT PLACEMENT  [facility]  . INTERSTIM IMPLANT PLACEMENT  10/05/2011   Procedure: Barrie Lyme IMPLANT FIRST STAGE;  Surgeon: [name], MD;  Location: [facility];  Service: Urology;  Laterality: N/A;  . INTERSTIM IMPLANT PLACEMENT  10/05/2011   Procedure: Barrie Lyme IMPLANT SECOND STAGE;  Surgeon: [name], MD;  Location: [facility];  Service: Urology;  Laterality: N/A;  . INTERSTIM IMPLANT PLACEMENT N/A 11/13/2016   Procedure: Barrie Lyme IMPLANT FIRST STAGE;  Surgeon: [name], MD;  Location: [facility];  Service: Urology;  Laterality: N/A;  . INTERSTIM IMPLANT PLACEMENT N/A 11/13/2016   Procedure: Barrie Lyme IMPLANT SECOND STAGE;  Surgeon: [name], MD;  Location: [facility];  Service: Urology;  Laterality: N/A;  . INTERSTIM IMPLANT REMOVAL N/A 11/13/2016   Procedure: REMOVAL OF INTERSTIM IMPLANT ONE AND TWO;  Surgeon: [name], MD;  Location: [facility];  Service: Urology;  Laterality: N/A;  . INTERSTIM IMPLANT REVISION N/A 03/06/2013   Procedure: REVISION OF INTERSTIM;  Surgeon: [name], MD;  [facility];  Service: Urology;  Laterality: N/A;  . LAPAROSCOPIC CHOLECYSTECTOMY  2001  . TONSILLECTOMY  1998  . VAGINAL HYSTERECTOMY  2003    Home Medications:  Allergies as of 12/25/2016      Reactions   Advair Diskus [fluticasone-salmeterol] Shortness Of Breath   Albuterol Sulfate Shortness Of Breath   Atrovent Shortness Of Breath   Bethanechol Shortness Of Breath, Rash, Other (See Comments)   RESPIRATORY DISTRESS    Budesonide-formoterol Fumarate Shortness Of Breath   Influenza Virus Vaccine Split Anaphylaxis   Xopenex [levalbuterol] Shortness Of Breath   Compazine Other (See Comments)   DYSTONIC   Fluoride Preparations    Lorazepam Other (See Comments)   "HAS OPPOSITE EFFECT"   Levofloxacin Rash   Potassium-containing Compounds Nausea And Vomiting   Just the po potassium  No problem with iv   Zofran [ondansetron Hcl] Rash      Medication List       Accurate as of 12/25/16  4:33 PM. Always use your most recent med list.          baclofen 20 MG tablet Commonly known as:  LIORESAL TAKE 1 TABLET (20 MG TOTAL) BY MOUTH 4 (FOUR) TIMES DAILY.   EPIPEN 2-PAK 0.3 mg/0.3 mL Soaj injection Generic drug:  EPINEPHrine Inject into the muscle.   esomeprazole 40 MG capsule Commonly known as:  NEXIUM Take 1 capsule (40 mg total) by mouth daily before breakfast.   gabapentin 600 MG tablet Commonly known as:  NEURONTIN TAKE 1 TABLET (600 MG TOTAL) BY MOUTH 3 (THREE) TIMES DAILY.   metaproterenol 20 MG tablet Commonly known as:  ALUPENT Take 1 tablet (20 mg total) by mouth 3 (three) times daily as needed.   oxyCODONE-acetaminophen 7.5-325 MG tablet Commonly known as:  PERCOCET One tablet every 4-5 hours as needed   Potassium 99 MG Tabs Take 1 tablet by mouth 2 (two) times daily.   promethazine 25 MG tablet Commonly known as:  PHENERGAN Take 1 tablet (25 mg total) by mouth every 6 (six) hours as needed for nausea.   spironolactone 100 MG tablet Commonly known as:  ALDACTONE TAKE 1 TABLET (100 MG TOTAL) BY MOUTH 2 (TWO) TIMES DAILY.   SYNTHROID 75 MCG tablet Generic drug:  levothyroxine TAKE 1 TABLET BY MOUTH DAILY. PLEASE SCHEDULE OFFICE VISIT FOR FOLLOWUP   tiZANidine 4 MG tablet Commonly known as:  ZANAFLEX TAKE 1 TABLET (4 MG TOTAL) BY MOUTH 2 (TWO) TIMES DAILY.       Allergies:  Allergies  Allergen Reactions  . Advair Diskus [Fluticasone-Salmeterol] Shortness Of Breath  .  Albuterol Sulfate Shortness Of Breath  . Atrovent Shortness Of Breath  . Bethanechol Shortness Of Breath, Rash and Other (See Comments)    RESPIRATORY DISTRESS  . Budesonide-Formoterol Fumarate Shortness Of Breath  . Influenza Virus Vaccine Split Anaphylaxis  . Xopenex [Levalbuterol] Shortness Of Breath  . Compazine Other (See Comments)    DYSTONIC  . Fluoride Preparations   . Lorazepam Other (See Comments)    "HAS OPPOSITE EFFECT"  . Levofloxacin Rash  . Potassium-Containing Compounds Nausea And Vomiting    Just the po potassium  No problem with iv  . Zofran [Ondansetron Hcl] Rash    Family History: Family History  Problem Relation Age of Onset  . Hypertension Father   . Colon cancer Neg Hx     Social History:  reports that she has never smoked. She has never used  smokeless tobacco. She reports that she does not drink alcohol or use drugs.  ROS: UROLOGY Frequent Urination?: No Hard to postpone urination?: No Burning/pain with urination?: No Get up at night to urinate?: No Leakage of urine?: No Urine stream starts and stops?: No Trouble starting stream?: No Do you have to strain to urinate?: No Blood in urine?: No Urinary tract infection?: No Sexually transmitted disease?: No Injury to kidneys or bladder?: No Painful intercourse?: No Weak stream?: No Currently pregnant?: No Vaginal bleeding?: No Last menstrual period?: n  Gastrointestinal Nausea?: No Vomiting?: No Indigestion/heartburn?: No Diarrhea?: No Constipation?: No  Constitutional Fever: No Night sweats?: No Weight loss?: No Fatigue?: No  Skin Skin rash/lesions?: No Itching?: No  Eyes Blurred vision?: No Double vision?: No  Ears/Nose/Throat Sore throat?: No Sinus problems?: No  Hematologic/Lymphatic Swollen glands?: No Easy bruising?: No  Cardiovascular Leg swelling?: No Chest pain?: No  Respiratory Cough?: No Shortness of breath?: No  Endocrine Excessive thirst?:  No  Musculoskeletal Back pain?: No Joint pain?: No  Neurological Headaches?: No Dizziness?: No  Psychologic Depression?: No Anxiety?: No  Physical Exam: BP 122/73 (BP Location: Left Arm, Patient Position: Sitting, Cuff Size: Normal)   Pulse 90   Ht 5\' 2"  (1.575 m)   Wt 109 lb 8 oz (49.7 kg)   BMI 20.03 kg/m     Laboratory Data: Lab Results  Component Value Date   WBC 5.5 04/07/2016   HGB 14.6 11/13/2016   HCT 43.0 11/13/2016   MCV 91.1 04/07/2016   PLT 169 04/07/2016    Lab Results  Component Value Date   CREATININE 0.70 11/13/2016    No results found for: PSA  No results found for: TESTOSTERONE  No results found for: HGBA1C  Urinalysis    Component Value Date/Time   APPEARANCEUR Clear 10/10/2016 1428   GLUCOSEU Negative 10/10/2016 1428   BILIRUBINUR Negative 10/10/2016 1428   PROTEINUR Negative 10/10/2016 1428   NITRITE Negative 10/10/2016 1428   LEUKOCYTESUR Negative 10/10/2016 1428    Pertinent Imaging: None   Assessment & Plan:  Incision looks normal today. There is little bit of induration but very minimal skin color changes. There is no tenderness. Judson Roch was able to do some programming to get vaginal sensation. She will be followed by phone. I will see her when necessary but otherwise in 3 months  There are no diagnoses linked to this encounter.  Return in about 3 months (around 03/27/2017).  [name], MD  [facility] [address], [place], [place] [address] [phone]

## 2016-12-25 NOTE — Progress Notes (Signed)
Patient present today for interstim recheck after turning device off last week. Device was turned on today and patient felt stimulation on program 2 at 1.2 but sensation was in her right foot (down tibal nerve) Program was switched to 4 with neg at 0 and positive at 2, patient felt some stimulation at 0.5 in the middle of buttocks. Another positive was added at 1 and patient states that she felt stimulation in her vaginal area at 1.0Patient is to keep a diary and call back on Friday for update.

## 2016-12-29 ENCOUNTER — Telehealth: Payer: Self-pay

## 2016-12-29 NOTE — Telephone Encounter (Signed)
Left mess/SW

## 2016-12-29 NOTE — Telephone Encounter (Signed)
Pt reports: After she left the office stimulation started fading away. Tuesday- device not working at all. Wednesday- turned device up to 1.5. Started working some, end of day no stimulation at all. Turned  device off last night.   Turned on this morning and it is currently still working. Requesting to know if you think she should try turning the device off @ night and resume in the mornings?

## 2016-12-31 ENCOUNTER — Other Ambulatory Visit: Payer: Self-pay | Admitting: Family Medicine

## 2017-01-10 ENCOUNTER — Other Ambulatory Visit: Payer: Self-pay | Admitting: Family Medicine

## 2017-01-10 NOTE — Telephone Encounter (Signed)
Pt needs refill on her   oxyCODONE-acetaminophen (PERCOCET) 7.5-325 MG tablet  Thanks teri

## 2017-01-10 NOTE — Telephone Encounter (Signed)
Last ov 05/29/16 Last filled 12/14/16 Please review. Thank you. sd

## 2017-01-11 MED ORDER — OXYCODONE-ACETAMINOPHEN 7.5-325 MG PO TABS: ORAL_TABLET | ORAL | 0 refills | Status: DC

## 2017-01-22 NOTE — Addendum Note (Signed)
Addendum  created 01/22/17 1242 by Myrtie Soman, MD   Sign clinical note

## 2017-01-22 NOTE — Anesthesia Postprocedure Evaluation (Signed)
Anesthesia Post Note  Patient: Kayla Curry  Procedure(s) Performed: Procedure(s) (LRB): REMOVAL OF INTERSTIM IMPLANT ONE AND TWO (N/A) INTERSTIM IMPLANT FIRST STAGE (N/A) INTERSTIM IMPLANT SECOND STAGE (N/A)     Anesthesia Post Evaluation  Last Vitals:  Vitals:   11/13/16 1345 11/13/16 1417  BP: 122/66 (!) 131/59  Pulse: 84 76  Resp: 12 12  Temp:  37.2 C    Last Pain:  Vitals:   11/14/16 1316  TempSrc:   PainSc: 0-No pain                 [name] S

## 2017-02-08 ENCOUNTER — Other Ambulatory Visit: Payer: Self-pay | Admitting: Family Medicine

## 2017-02-08 MED ORDER — OXYCODONE-ACETAMINOPHEN 7.5-325 MG PO TABS: ORAL_TABLET | ORAL | 0 refills | Status: DC

## 2017-02-13 ENCOUNTER — Ambulatory Visit: Payer: Self-pay | Admitting: Physician Assistant

## 2017-02-13 VITALS — BP 130/80 | HR 93 | Temp 98.8°F

## 2017-02-13 DIAGNOSIS — J069 Acute upper respiratory infection, unspecified: Secondary | ICD-10-CM

## 2017-02-13 MED ORDER — AZITHROMYCIN 250 MG PO TABS: ORAL_TABLET | ORAL | 0 refills | Status: DC

## 2017-02-13 NOTE — Progress Notes (Signed)
S: C/o runny nose and congestion for 3 days, + fever, chills last night, denies cp/sob, v/d; mucus was green t, cough is sporadic, dry  Using otc meds:   O: PE: vitals wnl, nad, perrl eomi, normocephalic, tms dull, nasal mucosa red and swollen, throat injected, neck supple no lymph, lungs c t a, cv rrr, neuro intact  A:  Acute uri   P: drink fluids, continue regular meds , use otc meds of choice, return if not improving in 5 days, return earlier if worsening   zpack

## 2017-03-01 ENCOUNTER — Other Ambulatory Visit: Payer: Self-pay | Admitting: Family Medicine

## 2017-03-26 ENCOUNTER — Encounter: Payer: Self-pay | Admitting: Urology

## 2017-03-26 ENCOUNTER — Ambulatory Visit (INDEPENDENT_AMBULATORY_CARE_PROVIDER_SITE_OTHER): Payer: Managed Care, Other (non HMO) | Admitting: Urology

## 2017-03-26 VITALS — BP 123/61 | HR 92 | Ht 62.0 in | Wt 112.3 lb

## 2017-03-26 DIAGNOSIS — N3946 Mixed incontinence: Secondary | ICD-10-CM

## 2017-03-26 DIAGNOSIS — R339 Retention of urine, unspecified: Secondary | ICD-10-CM | POA: Diagnosis not present

## 2017-03-26 MED ORDER — MIRABEGRON ER 50 MG PO TB24: 50.0000 mg | ORAL_TABLET | Freq: Every day | ORAL | 11 refills | Status: DC

## 2017-03-26 NOTE — Progress Notes (Signed)
03/26/2017 4:24 PM   [name] 1971/02/10 [id number]  Referring provider: [name], MD [address] [place], [place] [address]  Chief Complaint  Patient presents with  . Urinary Retention    HPI: The patient had InterStim recently for retention. She was empirically treated for a possible wound infection. There has been a lot of troubleshooting over the phone and in the office for recurrent retention. The device was turned off as part of the process. Impedance checks were normal. When the device was turned up she started to feel a sensation in her foot again. The device has been replaced twice in the face of not be a little trouble shoot the exact dysfunction in the replacement has been successful. She has had pain on the left side before.  On April 16 she had a 2.5 cm x 1 cm raised area involving the right midline incision that was a bit tender. Some of the sutures were still present. The IPG was normal. My concerns were noted and I started her on Keflex    Today Subsequently the incision issues above normalize. Further programming was performed then. Fortunately the patient has not needed to catheterize. She finds that she turns it off at night and then again on in the morning it works better. She says her incision has completely cleared  Incision looks great   PMH: Past Medical History:  Diagnosis Date  . Adrenal adenoma, right   . Chronic pain syndrome   . Gitelman syndrome    DISORDER MAGNESIUM METABOLISM  . History of pelvic fracture   . History of supraventricular tachycardia    PER PT ON 02-28-2013 NO ISSUES SINCE ABLATION IN 2005  . Hypokalemia    CHRONIC  . Hypothyroidism   . 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 Magnesium metabolism disorder NEPHROLOGIST-  DR [name] ([facility] [place])   [place]  . Malfunction of device    INTERSTIM  . Neurogenic  bladder   . S/P ablation of ventricular arrhythmia HX SVT--  2005   [place]--  LAST VISIT 2012 -- PT RELEASED FROM CARE ON PRN BASIS  . Self-catheterizes urinary bladder   . Urinary retention    RESIDUAL FROM PELVIC FX INJURY---Self cath TID and prn  . Wears contact lenses     Surgical History: Past Surgical History:  Procedure Laterality Date  . APPENDECTOMY  1994   EXPL. LAP.  Marland Kitchen CARDIAC ELECTROPHYSIOLOGY STUDY AND ABLATION  2005- AT [facility]   SVT--  PT STATES NO ISSUES SINCE   . INTERSTIM IMPLANT PLACEMENT  [facility]  . INTERSTIM IMPLANT PLACEMENT  10/05/2011   Procedure: Barrie Lyme IMPLANT FIRST STAGE;  Surgeon: [name], MD;  Location: [facility];  Service: Urology;  Laterality: N/A;  . INTERSTIM IMPLANT PLACEMENT  10/05/2011   Procedure: Barrie Lyme IMPLANT SECOND STAGE;  Surgeon: [name], MD;  Location: [facility];  Service: Urology;  Laterality: N/A;  . INTERSTIM IMPLANT PLACEMENT N/A 11/13/2016   Procedure: Barrie Lyme IMPLANT FIRST STAGE;  Surgeon: [name], MD;  Location: [facility];  Service: Urology;  Laterality: N/A;  . INTERSTIM IMPLANT PLACEMENT N/A 11/13/2016   Procedure: Barrie Lyme IMPLANT SECOND STAGE;  Surgeon: [name], MD;  Location: [facility];  Service: Urology;  Laterality: N/A;  . INTERSTIM IMPLANT REMOVAL N/A 11/13/2016  Procedure: REMOVAL OF INTERSTIM IMPLANT ONE AND TWO;  Surgeon: [name], MD;  Location: [facility];  Service: Urology;  Laterality: N/A;  . INTERSTIM IMPLANT REVISION N/A 03/06/2013   Procedure: REVISION OF Barrie Lyme;  Surgeon: [name], MD;  Location: [facility];  Service: Urology;  Laterality: N/A;  . LAPAROSCOPIC CHOLECYSTECTOMY  2001  . TONSILLECTOMY  1998  . VAGINAL HYSTERECTOMY  2003    Home Medications:  Allergies as of 03/26/2017      Reactions   Advair  Diskus [fluticasone-salmeterol] Shortness Of Breath   Albuterol Sulfate Shortness Of Breath   Atrovent Shortness Of Breath   Bethanechol Shortness Of Breath, Rash, Other (See Comments)   RESPIRATORY DISTRESS   Budesonide-formoterol Fumarate Shortness Of Breath   Influenza Virus Vaccine Split Anaphylaxis   Xopenex [levalbuterol] Shortness Of Breath   Compazine Other (See Comments)   DYSTONIC   Fluoride Preparations    Lorazepam Other (See Comments)   "HAS OPPOSITE EFFECT"   Levofloxacin Rash   Potassium-containing Compounds Nausea And Vomiting   Just the po potassium  No problem with iv   Zofran [ondansetron Hcl] Rash      Medication List       Accurate as of 03/26/17  4:24 PM. Always use your most recent med list.          baclofen 20 MG tablet Commonly known as:  LIORESAL TAKE 1 TABLET (20 MG TOTAL) BY MOUTH 4 (FOUR) TIMES DAILY.   EPIPEN 2-PAK 0.3 mg/0.3 mL Soaj injection Generic drug:  EPINEPHrine Inject into the muscle.   esomeprazole 40 MG capsule Commonly known as:  NEXIUM Take 1 capsule (40 mg total) by mouth daily before breakfast.   gabapentin 600 MG tablet Commonly known as:  NEURONTIN TAKE 1 TABLET (600 MG TOTAL) BY MOUTH 3 (THREE) TIMES DAILY.   metaproterenol 20 MG tablet Commonly known as:  ALUPENT Take 1 tablet (20 mg total) by mouth 3 (three) times daily as needed.   oxyCODONE-acetaminophen 7.5-325 MG tablet Commonly known as:  PERCOCET One tablet every 4-5 hours as needed   Potassium 99 MG Tabs Take 1 tablet by mouth 2 (two) times daily.   promethazine 25 MG tablet Commonly known as:  PHENERGAN TAKE 1 TABLET (25 MG TOTAL) BY MOUTH EVERY 6 (SIX) HOURS AS NEEDED FOR NAUSEA.   spironolactone 100 MG tablet Commonly known as:  ALDACTONE TAKE 1 TABLET (100 MG TOTAL) BY MOUTH 2 (TWO) TIMES DAILY.   SYNTHROID 75 MCG tablet Generic drug:  levothyroxine TAKE 1 TABLET BY MOUTH DAILY. PLEASE SCHEDULE OFFICE VISIT FOR FOLLOWUP   tiZANidine 4 MG  tablet Commonly known as:  ZANAFLEX TAKE 1 TABLET (4 MG TOTAL) BY MOUTH 2 (TWO) TIMES DAILY.       Allergies:  Allergies  Allergen Reactions  . Advair Diskus [Fluticasone-Salmeterol] Shortness Of Breath  . Albuterol Sulfate Shortness Of Breath  . Atrovent Shortness Of Breath  . Bethanechol Shortness Of Breath, Rash and Other (See Comments)    RESPIRATORY DISTRESS  . Budesonide-Formoterol Fumarate Shortness Of Breath  . Influenza Virus Vaccine Split Anaphylaxis  . Xopenex [Levalbuterol] Shortness Of Breath  . Compazine Other (See Comments)    DYSTONIC  . Fluoride Preparations   . Lorazepam Other (See Comments)    "HAS OPPOSITE EFFECT"  . Levofloxacin Rash  . Potassium-Containing Compounds Nausea And Vomiting    Just the po potassium  No problem with iv  . Zofran [Ondansetron Hcl] Rash  Family History: Family History  Problem Relation Age of Onset  . Hypertension Father   . Colon cancer Neg Hx     Social History:  reports that she has never smoked. She has never used smokeless tobacco. She reports that she does not drink alcohol or use drugs.  ROS: UROLOGY Frequent Urination?: No Hard to postpone urination?: No Burning/pain with urination?: No Get up at night to urinate?: No Leakage of urine?: No Urine stream starts and stops?: No Trouble starting stream?: Yes Do you have to strain to urinate?: Yes Blood in urine?: No Urinary tract infection?: No Sexually transmitted disease?: No Injury to kidneys or bladder?: No Painful intercourse?: No Weak stream?: No Currently pregnant?: No Vaginal bleeding?: No Last menstrual period?: n  Gastrointestinal Nausea?: No Vomiting?: No Indigestion/heartburn?: No Diarrhea?: No Constipation?: No  Constitutional Fever: No Night sweats?: No Weight loss?: No Fatigue?: No  Skin Skin rash/lesions?: No Itching?: No  Eyes Blurred vision?: No Double vision?: No  Ears/Nose/Throat Sore throat?: No Sinus problems?:  No  Hematologic/Lymphatic Swollen glands?: No Easy bruising?: No  Cardiovascular Leg swelling?: No Chest pain?: No  Respiratory Cough?: No Shortness of breath?: No  Endocrine Excessive thirst?: No  Musculoskeletal Back pain?: No Joint pain?: No  Neurological Headaches?: No Dizziness?: No  Psychologic Depression?: No Anxiety?: No  Physical Exam: BP 123/61 (BP Location: Left Arm, Patient Position: Sitting, Cuff Size: Normal)   Pulse 92   Ht 5\' 2"  (1.575 m)   Wt 50.9 kg (112 lb 4.8 oz)   BMI 20.54 kg/m   Constitutional:  Alert and oriented, No acute distress.   Laboratory Data: Lab Results  Component Value Date   WBC 5.5 04/07/2016   HGB 14.6 11/13/2016   HCT 43.0 11/13/2016   MCV 91.1 04/07/2016   PLT 169 04/07/2016    Lab Results  Component Value Date   CREATININE 0.70 11/13/2016    No results found for: PSA  No results found for: TESTOSTERONE  No results found for: HGBA1C  Urinalysis    Component Value Date/Time   APPEARANCEUR Clear 10/10/2016 1428   GLUCOSEU Negative 10/10/2016 1428   BILIRUBINUR Negative 10/10/2016 1428   PROTEINUR Negative 10/10/2016 1428   NITRITE Negative 10/10/2016 1428   LEUKOCYTESUR Negative 10/10/2016 1428    Pertinent Imaging: none  Assessment & Plan:  I explained once again to Quincey that I think there is a very narrow margin for the device to work for her complicated voiding dysfunction. The 2 times the device was replaced and we really could not troubleshoot a true problem and likely there is a very narrow margin for benefit. Millimeters could make a difference. We will troubleshoot device again. I will see her.  There are no diagnoses linked to this encounter.  No Follow-up on file.  [name], MD  [facility] [address], [place],  [address] [phone]

## 2017-04-17 ENCOUNTER — Other Ambulatory Visit: Payer: Self-pay | Admitting: Family Medicine

## 2017-04-17 MED ORDER — OXYCODONE-ACETAMINOPHEN 7.5-325 MG PO TABS: ORAL_TABLET | ORAL | 0 refills | Status: DC

## 2017-04-17 NOTE — Telephone Encounter (Signed)
Pt contacted office for refill request on the following medications:  oxyCODONE-acetaminophen (PERCOCET) 7.5-325 MG tablet   CB#[phone]/MW

## 2017-05-05 ENCOUNTER — Other Ambulatory Visit: Payer: Self-pay | Admitting: Family Medicine

## 2017-05-16 ENCOUNTER — Ambulatory Visit: Payer: Managed Care, Other (non HMO) | Admitting: Family Medicine

## 2017-05-18 ENCOUNTER — Encounter: Payer: Self-pay | Admitting: Family Medicine

## 2017-05-18 ENCOUNTER — Ambulatory Visit (INDEPENDENT_AMBULATORY_CARE_PROVIDER_SITE_OTHER): Payer: Managed Care, Other (non HMO) | Admitting: Family Medicine

## 2017-05-18 VITALS — BP 108/62 | HR 76 | Temp 98.3°F | Resp 16 | Ht 62.0 in | Wt 111.0 lb

## 2017-05-18 DIAGNOSIS — E559 Vitamin D deficiency, unspecified: Secondary | ICD-10-CM

## 2017-05-18 DIAGNOSIS — E039 Hypothyroidism, unspecified: Secondary | ICD-10-CM

## 2017-05-18 DIAGNOSIS — E876 Hypokalemia: Secondary | ICD-10-CM

## 2017-05-18 DIAGNOSIS — G894 Chronic pain syndrome: Secondary | ICD-10-CM | POA: Diagnosis not present

## 2017-05-18 NOTE — Progress Notes (Signed)
Patient: Kayla Curry Female    DOB: 1971/08/04   46 y.o.   MRN: [id number] Visit Date: 05/18/2017  Today's Provider: Lelon Huh, MD   Chief Complaint  Patient presents with  . Back Pain  . Hypothyroidism  . Hyperkalemia   Subjective:    HPI  Chronic back pain, follow up: Patient was last seen in the office 11 months ago. No changes were made in her medications. She is currently taking Oxy-Acetaminophen 7.5/325 for pain management. She reports good symptom control. Pain does not completely abate with medication, but is tolerable. Takes four times most days.   Hypothyroidism, follow up: Patient was last seen in the office 11 months ago. No changes were made in her medications. She is currently taking Synthroid 17mcg daily, and reports good symptom control.  Lab Results  Component Value Date   TSH 0.929 05/24/2016      Hyperkalemia, follow up: Patient was last seen in the office 11 months ago. Since last visit, the spironolactone was increased to 200mg  daily. Patient reports good symptom control to the medication changes.  BMP Latest Ref Rng & Units 11/13/2016 06/14/2016 05/24/2016  Glucose 65 - 99 mg/dL 74 82 89  BUN 6 - 20 mg/dL 14 8 8   Creatinine 0.44 - 1.00 mg/dL 0.70 0.50(L) 0.63  BUN/Creat Ratio 9 - 23 - 16 13  Sodium 135 - 145 mmol/L 144 145(H) 142  Potassium 3.5 - 5.1 mmol/L 2.7(LL) 3.0(L) 2.7(L)  Chloride 101 - 111 mmol/L 104 99 99  CO2 18 - 29 mmol/L - 30(H) 27  Calcium 8.7 - 10.2 mg/dL - 9.7 9.2     Allergies  Allergen Reactions  . Advair Diskus [Fluticasone-Salmeterol] Shortness Of Breath  . Albuterol Sulfate Shortness Of Breath  . Atrovent Shortness Of Breath  . Bethanechol Shortness Of Breath, Rash and Other (See Comments)    RESPIRATORY DISTRESS  . Budesonide-Formoterol Fumarate Shortness Of Breath  . Influenza Vac Split Quad Anaphylaxis  . Xopenex [Levalbuterol] Shortness Of Breath  . Compazine Other (See Comments)    DYSTONIC  . Fluoride  Preparations   . Lorazepam Other (See Comments)    "HAS OPPOSITE EFFECT"  . Levofloxacin Rash  . Potassium-Containing Compounds Nausea And Vomiting    Just the po potassium  No problem with iv  . Zofran [Ondansetron Hcl] Rash     Current Outpatient Prescriptions:  .  baclofen (LIORESAL) 20 MG tablet, TAKE 1 TABLET (20 MG TOTAL) BY MOUTH 4 (FOUR) TIMES DAILY., Disp: 120 tablet, Rfl: 5 .  EPINEPHrine (EPIPEN 2-PAK) 0.3 mg/0.3 mL IJ SOAJ injection, Inject into the muscle., Disp: , Rfl:  .  esomeprazole (NEXIUM) 40 MG capsule, Take 1 capsule (40 mg total) by mouth daily before breakfast., Disp: 30 capsule, Rfl: 11 .  gabapentin (NEURONTIN) 600 MG tablet, TAKE 1 TABLET (600 MG TOTAL) BY MOUTH 3 (THREE) TIMES DAILY., Disp: 90 tablet, Rfl: 4 .  metaproterenol (ALUPENT) 20 MG tablet, Take 1 tablet (20 mg total) by mouth 3 (three) times daily as needed., Disp: 60 tablet, Rfl: 3 .  oxyCODONE-acetaminophen (PERCOCET) 7.5-325 MG tablet, One tablet every 4-5 hours as needed, Disp: 120 tablet, Rfl: 0 .  Potassium 99 MG TABS, Take 1 tablet by mouth 2 (two) times daily. , Disp: , Rfl:  .  promethazine (PHENERGAN) 25 MG tablet, TAKE 1 TABLET (25 MG TOTAL) BY MOUTH EVERY 6 (SIX) HOURS AS NEEDED FOR NAUSEA., Disp: 60 tablet, Rfl: 5 .  spironolactone (  ALDACTONE) 100 MG tablet, TAKE 1 TABLET (100 MG TOTAL) BY MOUTH 2 (TWO) TIMES DAILY., Disp: 60 tablet, Rfl: 5 .  SYNTHROID 75 MCG tablet, TAKE 1 TABLET BY MOUTH DAILY. PLEASE SCHEDULE OFFICE VISIT FOR FOLLOWUP (Patient taking differently: TAKE 1 TABLET BY MOUTH DAILY. PLEASE SCHEDULE OFFICE VISIT FOR FOLLOWUP--- TAKES IN AM), Disp: 30 tablet, Rfl: 12 .  tiZANidine (ZANAFLEX) 4 MG tablet, TAKE 1 TABLET (4 MG TOTAL) BY MOUTH 2 (TWO) TIMES DAILY., Disp: 60 tablet, Rfl: 5  Review of Systems  Constitutional: Negative.   Respiratory: Negative.   Cardiovascular: Negative for chest pain, palpitations and leg swelling.  Endocrine: Negative.   Musculoskeletal: Negative.    Neurological: Negative.   Psychiatric/Behavioral: Negative.     Social History  Substance Use Topics  . Smoking status: Never Smoker  . Smokeless tobacco: Never Used  . Alcohol use No   Objective:   BP 108/62 (BP Location: Left Arm, Patient Position: Sitting, Cuff Size: Normal)   Pulse 76   Temp 98.3 F (36.8 C)   Resp 16   Ht 5\' 2"  (1.575 m)   Wt 111 lb (50.3 kg)   BMI 20.30 kg/m  Vitals:   05/18/17 1104  BP: 108/62  Pulse: 76  Resp: 16  Temp: 98.3 F (36.8 C)  Weight: 111 lb (50.3 kg)  Height: 5\' 2"  (1.575 m)     Physical Exam   General Appearance:    Alert, cooperative, no distress  Eyes:    PERRL, conjunctiva/corneas clear, EOM's intact       Lungs:     Clear to auscultation bilaterally, respirations unlabored  Heart:    Regular rate and rhythm  Neurologic:   Awake, alert, oriented x 3. No apparent focal neurological           defect.            Assessment & Plan:     1. Hypothyroidism, unspecified type  - TSH - T4, free  2. Vitamin D deficiency  - VITAMIN D 25 Hydroxy (Vit-D Deficiency, Fractures)  3. Hypokalemia  - COMPLETE METABOLIC PANEL WITH GFR  4. Chronic pain associated with significant psychosocial dysfunction Doing weill with current pain medication regiment. Continue current medications.         Lelon Huh, MD  [facility]

## 2017-05-18 NOTE — Patient Instructions (Signed)
Please go to the [facility] lab draw center in [address] on the second floor of [facility]   

## 2017-06-04 ENCOUNTER — Encounter: Payer: Self-pay | Admitting: Family Medicine

## 2017-06-06 ENCOUNTER — Other Ambulatory Visit: Payer: Self-pay | Admitting: Family Medicine

## 2017-06-13 ENCOUNTER — Other Ambulatory Visit: Payer: Self-pay | Admitting: Family Medicine

## 2017-06-13 MED ORDER — OXYCODONE-ACETAMINOPHEN 7.5-325 MG PO TABS: ORAL_TABLET | ORAL | 0 refills | Status: DC

## 2017-06-13 NOTE — Telephone Encounter (Signed)
H. Pylori does not cause heartburn, it causes stomach and duodenal ulcers. If she is taking Nexium every day and still having heartburn then she needs to see GI. Can refer if she likes

## 2017-06-13 NOTE — Telephone Encounter (Addendum)
Pt contacted office for refill request on the following medications:  oxyCODONE-acetaminophen (PERCOCET) 7.5-325 MG tablet  CB#[phone]/MW  Pt states she is having a lot of heartburn and is asking if she pick up a lab slip to have H pylori lab test on Friday/MW

## 2017-06-16 LAB — TSH: TSH: 3.61 m[IU]/L

## 2017-06-16 LAB — COMPLETE METABOLIC PANEL WITH GFR
AG RATIO: 2.1 (calc) (ref 1.0–2.5)
ALBUMIN MSPROF: 4.6 g/dL (ref 3.6–5.1)
ALKALINE PHOSPHATASE (APISO): 39 U/L (ref 33–115)
ALT: 18 U/L (ref 6–29)
AST: 16 U/L (ref 10–35)
BILIRUBIN TOTAL: 0.4 mg/dL (ref 0.2–1.2)
BUN: 14 mg/dL (ref 7–25)
CO2: 26 mmol/L (ref 20–32)
Calcium: 9.4 mg/dL (ref 8.6–10.2)
Chloride: 103 mmol/L (ref 98–110)
Creat: 0.86 mg/dL (ref 0.50–1.10)
GFR, Est African American: 95 mL/min/{1.73_m2} (ref >=60)
GFR, Est Non African American: 82 mL/min/{1.73_m2} (ref >=60)
GLUCOSE: 75 mg/dL (ref 65–99)
Globulin: 2.2 g/dL (calc) (ref 1.9–3.7)
POTASSIUM: 3.1 mmol/L — AB (ref 3.5–5.3)
Sodium: 141 mmol/L (ref 135–146)
Total Protein: 6.8 g/dL (ref 6.1–8.1)

## 2017-06-16 LAB — VITAMIN D 25 HYDROXY (VIT D DEFICIENCY, FRACTURES): VIT D 25 HYDROXY: 32 ng/mL (ref 30–100)

## 2017-06-16 LAB — T4, FREE: Free T4: 1 ng/dL (ref 0.8–1.8)

## 2017-06-19 ENCOUNTER — Other Ambulatory Visit: Payer: Self-pay | Admitting: Family Medicine

## 2017-06-19 NOTE — Telephone Encounter (Signed)
Pharmacy requesting refills. Thanks!  

## 2017-07-03 ENCOUNTER — Other Ambulatory Visit: Payer: Self-pay | Admitting: Family Medicine

## 2017-08-09 ENCOUNTER — Other Ambulatory Visit: Payer: Self-pay | Admitting: Family Medicine

## 2017-08-09 MED ORDER — OXYCODONE-ACETAMINOPHEN 7.5-325 MG PO TABS: ORAL_TABLET | ORAL | 0 refills | Status: DC

## 2017-08-09 NOTE — Telephone Encounter (Signed)
Pt contacted office for refill request on the following medications:  oxyCODONE-acetaminophen (PERCOCET) 7.5-325 MG tablet   [facility]  [phone]

## 2017-08-09 NOTE — Telephone Encounter (Signed)
Please advise patient that oxycodone/apap prescription has been sent electronically to   [id number] [place] [place] [place] [address] Phone: [phone] Fax: [phone]

## 2017-08-09 NOTE — Telephone Encounter (Signed)
Pt advised-Sostenes Kauffmann V Ha Placeres, RMA  

## 2017-09-21 ENCOUNTER — Ambulatory Visit: Payer: Self-pay | Admitting: Family Medicine

## 2017-09-21 VITALS — BP 119/74 | HR 87 | Temp 99.0°F

## 2017-09-21 DIAGNOSIS — R05 Cough: Secondary | ICD-10-CM

## 2017-09-21 DIAGNOSIS — R058 Other specified cough: Secondary | ICD-10-CM

## 2017-09-21 DIAGNOSIS — R5383 Other fatigue: Secondary | ICD-10-CM

## 2017-09-21 DIAGNOSIS — J9801 Acute bronchospasm: Secondary | ICD-10-CM

## 2017-09-21 MED ORDER — HYDROCODONE-HOMATROPINE 5-1.5 MG/5ML PO SYRP: 5.0000 mL | ORAL_SOLUTION | ORAL | 0 refills | Status: DC | PRN

## 2017-09-21 MED ORDER — PREDNISONE 20 MG PO TABS: ORAL_TABLET | ORAL | 0 refills | Status: DC

## 2017-09-21 NOTE — Progress Notes (Signed)
Patient ID: Kayla Curry, female    DOB: [birth year]  Age: 47 y.o. MRN: [id number]  Chief Complaint  Patient presents with  . Cough    x1 month    Subjective:   Patient presents with history of having an upper respiratory infection over the past month.  It took her about 3 weeks to get rid of the head congestion and drainage.  However the cough has continued to persist.  The cough is getting worse.  It is not productive.  She took an old oxycodone pill last night to suppress her cough.  She feels short of breath and and is fatigued from coughing all the time.  Her husband had a similar illness and it is gotten well.  She does not smoke.  She does work at the [employer].  She does not have any HEENT symptoms at this time.  She also does not have any fevers.  She has been taking Tylenol or ibuprofen regularly so that could have suppressed fevers.  She is not coughing up anything.  She is allergic to a number of things cannot use the various inhalers.  She has been on Alupent tablets which do not seem to help her a lot.  She has some OTC cough syrups.  Current allergies, medications, problem list, past/family and social histories reviewed.  Objective:  BP 119/74 (BP Location: Left Arm, Patient Position: Sitting, Cuff Size: Normal)   Pulse 87   Temp 99 F (37.2 C) (Oral)   SpO2 100%   Pleasant lady who looks very fatigued.  She is coughing frequently, a dry sounding cough.  She says it feels like asthma but she has not been having audible wheezing, just some tightness sensation in her chest.  Her TMs are normal.  Throat clear.  Neck supple without significant nodes.  Chest has soft respirations bilaterally, no rhonchi rales or wheezes.  On forced expiration there is a little end expiratory wheeze associated with a prolonged X Tory phase.  Heart regular without any murmurs.  She says she has taken prednisone in the past, despite all of the listed allergies and intolerances. O2 saturation is good as  noted above.  Assessment & Plan:   Assessment: 1. Post-viral cough syndrome   2. Fatigue, unspecified type   3. Bronchospasm       Plan: Instructions  No orders of the defined types were placed in this encounter.   Meds ordered this encounter  Medications  . HYDROcodone-homatropine (HYCODAN) 5-1.5 MG/5ML syrup    Sig: Take 5 mLs by mouth every 4 (four) hours as needed for cough.    Dispense:  120 mL    Refill:  0  . predniSONE (DELTASONE) 20 MG tablet    Sig: Take 3 daily for for 2 days, then 2 daily for 2 days, then 1 daily for 2 days as directed.    Dispense:  12 tablet    Refill:  0         Patient Instructions  Drink plenty of fluids  Continue using OTC cough meds if needed.  At nighttime or when you are not working you can take the Hycodan cough syrup, 1 teaspoon every 4-6 hours as needed for bad cough.  If you are getting more short of breath go to the emergency room.  If not improving over the next week if you need to be reassessed with a chest x-ray and may be some breathing function testing.  Prednisone 20 mg 3 daily for 2  days, then 2 daily for 2 days, then 1 daily for 2 days    Return if symptoms worsen or fail to improve.   Phyllip Claw, MD 09/21/2017

## 2017-09-21 NOTE — Patient Instructions (Addendum)
Drink plenty of fluids  Continue using OTC cough meds if needed.  At nighttime or when you are not working you can take the Hycodan cough syrup, 1 teaspoon every 4-6 hours as needed for bad cough.  If you are getting more short of breath go to the emergency room.  If not improving over the next week if you need to be reassessed with a chest x-ray and may be some breathing function testing.  Prednisone 20 mg 3 daily for 2 days, then 2 daily for 2 days, then 1 daily for 2 days

## 2017-10-01 IMAGING — CR DG CHEST 2V
2 series · 2 of 2 positions shown · non-contrast
Comparison: 11/30/2014

CLINICAL DATA: Chest pain

EXAM:
CHEST  2 VIEW

[chest pa]
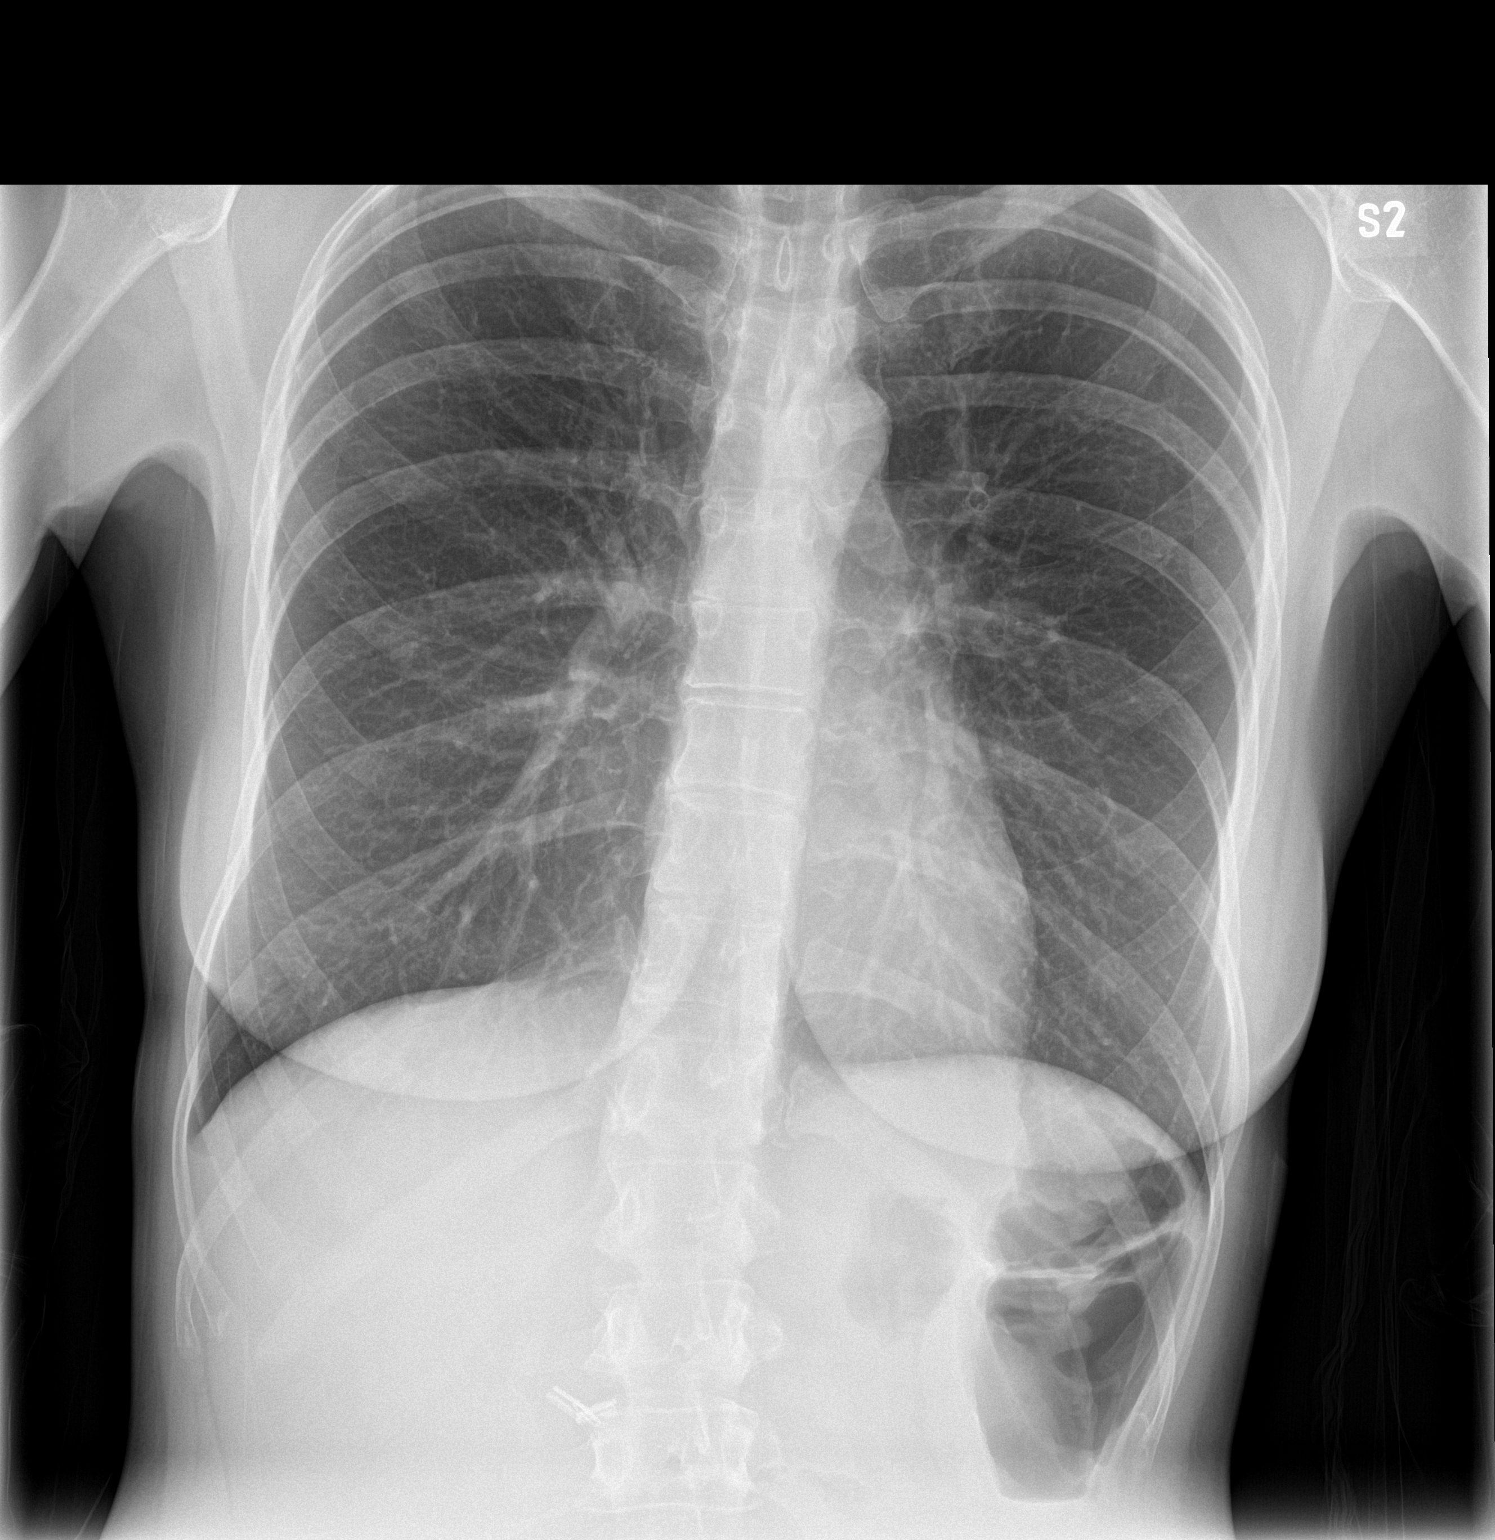

[chest lat]
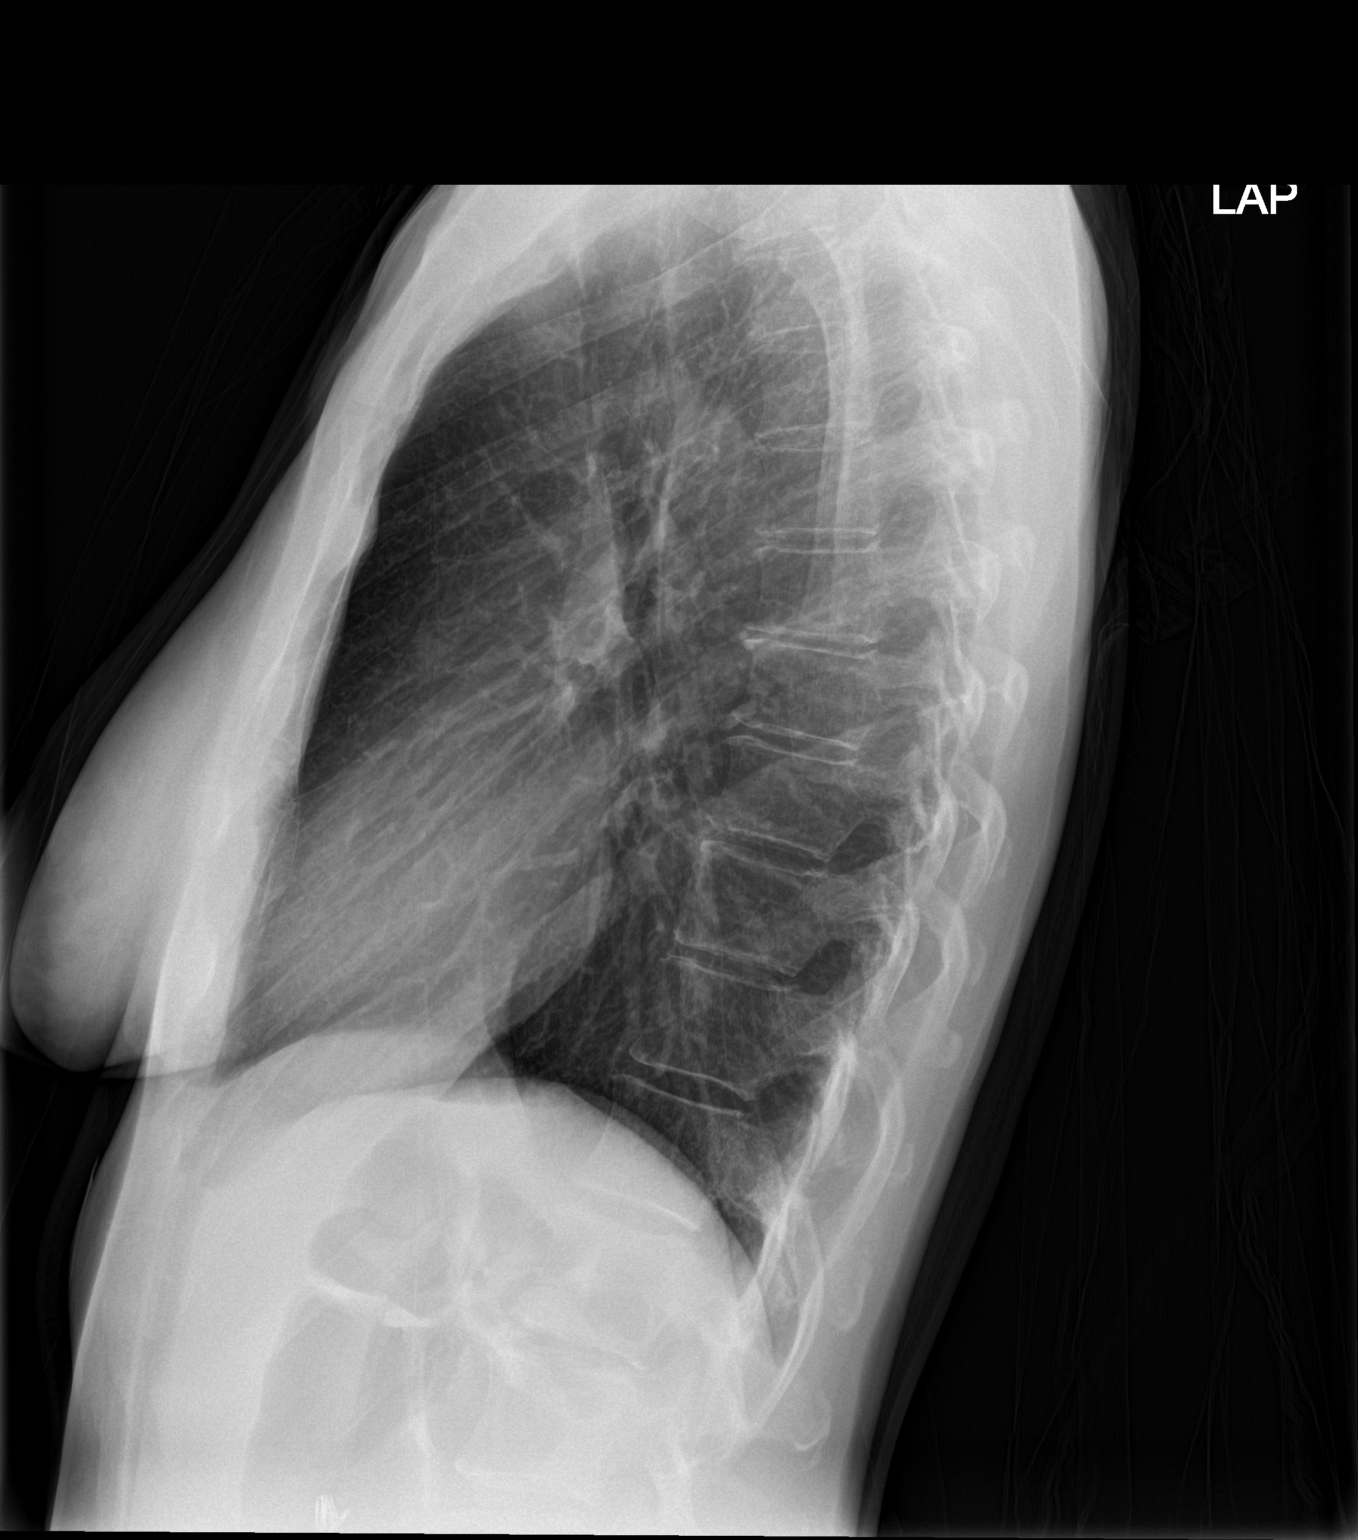

[2 of 2 positions shown; findings below may reference images not displayed]

FINDINGS: The heart size and mediastinal contours are within normal limits.
Both lungs are clear. The visualized skeletal structures are
unremarkable.
IMPRESSION: No active cardiopulmonary disease.

## 2017-10-05 ENCOUNTER — Other Ambulatory Visit: Payer: Self-pay | Admitting: Family Medicine

## 2017-10-05 NOTE — Telephone Encounter (Signed)
Pt contacted office for refill request on the following medications:  promethazine (PHENERGAN) 25 MG tablet  [facility]  Last Rx: 07/03/17 with 5 refills LOV: 05/18/17  Pt stated she has already gotten the remaining refills and is out of the medication. Pt stated that she has a stomach virus and needs the medication if possible. Please advise. Thanks TNP

## 2017-10-06 MED ORDER — PROMETHAZINE HCL 25 MG PO TABS: 25.0000 mg | ORAL_TABLET | Freq: Four times a day (QID) | ORAL | 5 refills | Status: DC | PRN

## 2017-10-15 ENCOUNTER — Telehealth: Payer: Self-pay | Admitting: Family Medicine

## 2017-10-15 MED ORDER — OXYCODONE-ACETAMINOPHEN 7.5-325 MG PO TABS: ORAL_TABLET | ORAL | 0 refills | Status: DC

## 2017-10-15 NOTE — Telephone Encounter (Signed)
Patient is requesting refill on her oxyCODONE-acetaminophen (PERCOCET) 7.5-325 MG tablet

## 2017-10-25 ENCOUNTER — Other Ambulatory Visit: Payer: Self-pay

## 2017-10-25 ENCOUNTER — Ambulatory Visit: Payer: Managed Care, Other (non HMO) | Admitting: Gastroenterology

## 2017-10-25 ENCOUNTER — Encounter: Payer: Self-pay | Admitting: Gastroenterology

## 2017-10-25 DIAGNOSIS — R1013 Epigastric pain: Secondary | ICD-10-CM | POA: Diagnosis not present

## 2017-10-25 DIAGNOSIS — G8929 Other chronic pain: Secondary | ICD-10-CM | POA: Diagnosis not present

## 2017-10-25 NOTE — Progress Notes (Signed)
Primary Care Physician: [name], MD  Primary Gastroenterologist:  Dr. Lucilla Lame  No chief complaint on file.   HPI: Kayla Curry is a 47 y.o. female here chronic abdominal pain.  The patient has been seen previously for the same issues and was found to have musculoskeletal pain.  The patient now reports that the abdominal pain is different than before and worse with eating. The patient has been on Nexium and states that she does not see any improvement with Nexium but sometimes the abdominal pain will let up if she starts to take large amounts of liquid antacids.  There is no report of any unexplained weight loss in the patient states she has actually gained some weight.  There is also no report of any black stools or bloody stools.  She denies any dysphagia.  Current Outpatient Medications  Medication Sig Dispense Refill  . baclofen (LIORESAL) 20 MG tablet TAKE 1 TABLET (20 MG TOTAL) BY MOUTH 4 (FOUR) TIMES DAILY. 120 tablet 5  . cyclobenzaprine (FLEXERIL) 10 MG tablet 1 tab twice daily as needed as needed    . EPINEPHrine (EPIPEN 2-PAK) 0.3 mg/0.3 mL IJ SOAJ injection Inject into the muscle.    . esomeprazole (NEXIUM) 40 MG capsule Take 1 capsule (40 mg total) by mouth daily before breakfast. 30 capsule 11  . Eszopiclone (ESZOPICLONE) 3 MG TABS Take by mouth.    . gabapentin (NEURONTIN) 600 MG tablet TAKE 1 TABLET (600 MG TOTAL) BY MOUTH 3 (THREE) TIMES DAILY. 90 tablet 4  . HYDROcodone-homatropine (HYCODAN) 5-1.5 MG/5ML syrup Take 5 mLs by mouth every 4 (four) hours as needed for cough. 120 mL 0  . metaproterenol (ALUPENT) 20 MG tablet Take 1 tablet (20 mg total) by mouth 3 (three) times daily as needed. 60 tablet 3  . oxyCODONE-acetaminophen (PERCOCET) 7.5-325 MG tablet One tablet every 4-5 hours as needed 120 tablet 0  . Potassium 99 MG TABS Take 1 tablet by mouth 2 (two) times daily.     . predniSONE (DELTASONE) 20 MG tablet Take 3 daily for for 2 days, then 2 daily for  2 days, then 1 daily for 2 days as directed. 12 tablet 0  . promethazine (PHENERGAN) 25 MG tablet Take 1 tablet (25 mg total) by mouth every 6 (six) hours as needed for nausea. 60 tablet 5  . spironolactone (ALDACTONE) 100 MG tablet TAKE 1 TABLET (100 MG TOTAL) BY MOUTH 2 (TWO) TIMES DAILY. 60 tablet 5  . SYNTHROID 75 MCG tablet TAKE 1 TABLET BY MOUTH DAILY. PLEASE SCHEDULE OFFICE VISIT FOR FOLLOWUP 30 tablet 11  . tiZANidine (ZANAFLEX) 4 MG tablet TAKE 1 TABLET (4 MG TOTAL) BY MOUTH 2 (TWO) TIMES DAILY. 60 tablet 5   No current facility-administered medications for this visit.     Allergies as of 10/25/2017 - Review Complete 09/21/2017  Allergen Reaction Noted  . Advair diskus [fluticasone-salmeterol] Shortness Of Breath 10/05/2011  . Albuterol sulfate Shortness Of Breath 06/10/2015  . Atrovent Shortness Of Breath 10/05/2011  . Bethanechol Shortness Of Breath, Rash, and Other (See Comments) 10/02/2011  . Budesonide-formoterol fumarate Shortness Of Breath 10/02/2011  . Influenza vac split quad Anaphylaxis 10/05/2011  . Influenza vaccines Anaphylaxis 05/18/2017  . Ondansetron Rash 07/02/2012  . Xopenex [levalbuterol] Shortness Of Breath 10/02/2011  . Compazine Other (See Comments) 10/02/2011  . Fluoride preparations  10/05/2011  . Lorazepam Other (See Comments) 10/02/2011  . Levofloxacin Rash 10/02/2011  . Potassium-containing compounds Nausea And Vomiting 10/05/2011  .  Zofran [ondansetron hcl] Rash 02/28/2013    ROS:  General: Negative for anorexia, weight loss, fever, chills, fatigue, weakness. ENT: Negative for hoarseness, difficulty swallowing , nasal congestion. CV: Negative for chest pain, angina, palpitations, dyspnea on exertion, peripheral edema.  Respiratory: Negative for dyspnea at rest, dyspnea on exertion, cough, sputum, wheezing.  GI: See history of present illness. GU:  Negative for dysuria, hematuria, urinary incontinence, urinary frequency, nocturnal urination.    Endo: Negative for unusual weight change.    Physical Examination:   There were no vitals taken for this visit.  General: Well-nourished, well-developed in no acute distress.  Eyes: No icterus. Conjunctivae pink. Mouth: Oropharyngeal mucosa moist and pink , no lesions erythema or exudate. Lungs: Clear to auscultation bilaterally. Non-labored. Heart: Regular rate and rhythm, no murmurs rubs or gallops.  Abdomen: Bowel sounds are normal, Mildly tender in the epigastric area, nondistended, no hepatosplenomegaly or masses, no abdominal bruits or hernia , no rebound or guarding.   Extremities: No lower extremity edema. No clubbing or deformities. Neuro: Alert and oriented x 3.  Grossly intact. Skin: Warm and dry, no jaundice.   Psych: Alert and cooperative, normal mood and affect.  Labs:    Imaging Studies: No results found.  Assessment and Plan:   Kayla Curry is a 47 y.o. y/o female who has a history of abdominal pain that in the past has been musculoskeletal.  The patient is having different pain now and states that it is not musculoskeletal. The patient has not been helped with Dexilant in the past and states she has also tried Protonix.  She also reports that her Nexium is not keeping the acid under control at the present time.  The patient will be set up for an EGD to look for any signs of reflux or gastritis.  She has been told that if her EGD is negative she may need to undergo a pH study to see if she is actually having reflux.  The patient has been explained the plan and agrees with it.    Lucilla Lame, MD. Marval Regal   Note: This dictation was prepared with Dragon dictation along with smaller phrase technology. Any transcriptional errors that result from this process are unintentional.

## 2017-10-30 ENCOUNTER — Other Ambulatory Visit: Payer: Self-pay

## 2017-10-30 ENCOUNTER — Encounter: Payer: Self-pay | Admitting: *Deleted

## 2017-10-30 DIAGNOSIS — R1013 Epigastric pain: Secondary | ICD-10-CM

## 2017-10-31 NOTE — Discharge Instructions (Signed)
General Anesthesia, Adult, Care After °These instructions provide you with information about caring for yourself after your procedure. Your health care provider may also give you more specific instructions. Your treatment has been planned according to current medical practices, but problems sometimes occur. Call your health care provider if you have any problems or questions after your procedure. °What can I expect after the procedure? °After the procedure, it is common to have: °· Vomiting. °· A sore throat. °· Mental slowness. ° °It is common to feel: °· Nauseous. °· Cold or shivery. °· Sleepy. °· Tired. °· Sore or achy, even in parts of your body where you did not have surgery. ° °Follow these instructions at home: °For at least 24 hours after the procedure: °· Do not: °? Participate in activities where you could fall or become injured. °? Drive. °? Use heavy machinery. °? Drink alcohol. °? Take sleeping pills or medicines that cause drowsiness. °? Make important decisions or sign legal documents. °? Take care of children on your own. °· Rest. °Eating and drinking °· If you vomit, drink water, juice, or soup when you can drink without vomiting. °· Drink enough fluid to keep your urine clear or pale yellow. °· Make sure you have little or no nausea before eating solid foods. °· Follow the diet recommended by your health care provider. °General instructions °· Have a responsible adult stay with you until you are awake and alert. °· Return to your normal activities as told by your health care provider. Ask your health care provider what activities are safe for you. °· Take over-the-counter and prescription medicines only as told by your health care provider. °· If you smoke, do not smoke without supervision. °· Keep all follow-up visits as told by your health care provider. This is important. °Contact a health care provider if: °· You continue to have nausea or vomiting at home, and medicines are not helpful. °· You  cannot drink fluids or start eating again. °· You cannot urinate after 8-12 hours. °· You develop a skin rash. °· You have fever. °· You have increasing redness at the site of your procedure. °Get help right away if: °· You have difficulty breathing. °· You have chest pain. °· You have unexpected bleeding. °· You feel that you are having a life-threatening or urgent problem. °This information is not intended to replace advice given to you by your health care provider. Make sure you discuss any questions you have with your health care provider. °Document Released: 11/13/2000 Document Revised: 01/10/2016 Document Reviewed: 07/22/2015 °Elsevier Interactive Patient Education © 2018 [employer] Inc[employer] ° °

## 2017-11-01 ENCOUNTER — Other Ambulatory Visit: Payer: Self-pay | Admitting: Family Medicine

## 2017-11-02 ENCOUNTER — Ambulatory Visit: Payer: Managed Care, Other (non HMO) | Admitting: Student in an Organized Health Care Education/Training Program

## 2017-11-02 ENCOUNTER — Ambulatory Visit
Admission: RE | Admit: 2017-11-02 | Discharge: 2017-11-02 | Disposition: A | Discharge status: Home / Self Care | Payer: Managed Care, Other (non HMO) | Source: Ambulatory Visit | Attending: Gastroenterology | Admitting: Gastroenterology

## 2017-11-02 ENCOUNTER — Encounter
Admission: RE | Disposition: A | Discharge status: Home / Self Care | Payer: Self-pay | Source: Ambulatory Visit | Attending: Gastroenterology

## 2017-11-02 ENCOUNTER — Other Ambulatory Visit: Payer: Self-pay

## 2017-11-02 DIAGNOSIS — Z79890 Hormone replacement therapy (postmenopausal): Secondary | ICD-10-CM | POA: Diagnosis not present

## 2017-11-02 DIAGNOSIS — K2950 Unspecified chronic gastritis without bleeding: Secondary | ICD-10-CM | POA: Diagnosis not present

## 2017-11-02 DIAGNOSIS — R112 Nausea with vomiting, unspecified: Secondary | ICD-10-CM

## 2017-11-02 DIAGNOSIS — K2970 Gastritis, unspecified, without bleeding: Secondary | ICD-10-CM | POA: Diagnosis not present

## 2017-11-02 DIAGNOSIS — E039 Hypothyroidism, unspecified: Secondary | ICD-10-CM | POA: Diagnosis not present

## 2017-11-02 DIAGNOSIS — K219 Gastro-esophageal reflux disease without esophagitis: Secondary | ICD-10-CM | POA: Diagnosis not present

## 2017-11-02 DIAGNOSIS — Z881 Allergy status to other antibiotic agents status: Secondary | ICD-10-CM | POA: Insufficient documentation

## 2017-11-02 DIAGNOSIS — J45909 Unspecified asthma, uncomplicated: Secondary | ICD-10-CM | POA: Diagnosis not present

## 2017-11-02 DIAGNOSIS — G894 Chronic pain syndrome: Secondary | ICD-10-CM | POA: Insufficient documentation

## 2017-11-02 DIAGNOSIS — E876 Hypokalemia: Secondary | ICD-10-CM | POA: Diagnosis not present

## 2017-11-02 DIAGNOSIS — Z888 Allergy status to other drugs, medicaments and biological substances status: Secondary | ICD-10-CM | POA: Diagnosis not present

## 2017-11-02 DIAGNOSIS — R1013 Epigastric pain: Secondary | ICD-10-CM

## 2017-11-02 DIAGNOSIS — K3189 Other diseases of stomach and duodenum: Secondary | ICD-10-CM | POA: Diagnosis not present

## 2017-11-02 HISTORY — PX: ESOPHAGOGASTRODUODENOSCOPY (EGD) WITH PROPOFOL: SHX5813

## 2017-11-02 HISTORY — DX: Unspecified asthma, uncomplicated: J45.909

## 2017-11-02 SURGERY — ESOPHAGOGASTRODUODENOSCOPY (EGD) WITH PROPOFOL
Anesthesia: General | Wound class: Clean Contaminated

## 2017-11-02 MED ORDER — SODIUM CHLORIDE 0.9 % IV SOLN: INTRAVENOUS | Status: DC

## 2017-11-02 MED ORDER — PROPOFOL 10 MG/ML IV BOLUS
INTRAVENOUS | Status: DC | PRN
  Administered 2017-11-02: 20 mg via INTRAVENOUS
  Administered 2017-11-02: 120 mg via INTRAVENOUS
  Administered 2017-11-02: 20 mg via INTRAVENOUS
  Administered 2017-11-02: 60 mg via INTRAVENOUS

## 2017-11-02 MED ORDER — PROMETHAZINE HCL 25 MG/ML IJ SOLN
6.2500 mg | Freq: Once | INTRAMUSCULAR | Status: AC | PRN
  Administered 2017-11-02: 6.25 mg via INTRAVENOUS

## 2017-11-02 MED ORDER — LACTATED RINGERS IV SOLN
INTRAVENOUS | Status: DC
  Administered 2017-11-02: 07:00:00 via INTRAVENOUS

## 2017-11-02 MED ORDER — OXYCODONE HCL 5 MG PO TABS: 5.0000 mg | ORAL_TABLET | Freq: Once | ORAL | Status: DC | PRN

## 2017-11-02 MED ORDER — OXYCODONE HCL 5 MG/5ML PO SOLN: 5.0000 mg | Freq: Once | ORAL | Status: DC | PRN

## 2017-11-02 SURGICAL SUPPLY — 33 items
BALLN DILATOR 10-12 8 (BALLOONS)
BALLN DILATOR 12-15 8 (BALLOONS)
BALLN DILATOR 15-18 8 (BALLOONS)
BALLN DILATOR CRE 0-12 8 (BALLOONS)
BALLN DILATOR ESOPH 8 10 CRE (MISCELLANEOUS) IMPLANT
BALLOON DILATOR 12-15 8 (BALLOONS) IMPLANT
BALLOON DILATOR 15-18 8 (BALLOONS) IMPLANT
BALLOON DILATOR CRE 0-12 8 (BALLOONS) IMPLANT
BLOCK BITE 60FR ADLT L/F GRN (MISCELLANEOUS) ×3 IMPLANT
CANISTER SUCT 1200ML W/VALVE (MISCELLANEOUS) ×3 IMPLANT
CLIP HMST 235XBRD CATH ROT (MISCELLANEOUS) IMPLANT
CLIP RESOLUTION 360 11X235 (MISCELLANEOUS)
ELECT REM PT RETURN 9FT ADLT (ELECTROSURGICAL)
ELECTRODE REM PT RTRN 9FT ADLT (ELECTROSURGICAL) IMPLANT
FCP ESCP3.2XJMB 240X2.8X (MISCELLANEOUS)
FORCEPS BIOP RAD 4 LRG CAP 4 (CUTTING FORCEPS) ×3 IMPLANT
FORCEPS BIOP RJ4 240 W/NDL (MISCELLANEOUS)
FORCEPS ESCP3.2XJMB 240X2.8X (MISCELLANEOUS) IMPLANT
GOWN CVR UNV OPN BCK APRN NK (MISCELLANEOUS) ×2 IMPLANT
GOWN ISOL THUMB LOOP REG UNIV (MISCELLANEOUS) ×4
INJECTOR VARIJECT VIN23 (MISCELLANEOUS) IMPLANT
KIT DEFENDO VALVE AND CONN (KITS) IMPLANT
KIT ENDO PROCEDURE OLY (KITS) ×3 IMPLANT
MARKER SPOT ENDO TATTOO 5ML (MISCELLANEOUS) IMPLANT
RETRIEVER NET PLAT FOOD (MISCELLANEOUS) IMPLANT
SNARE SHORT THROW 13M SML OVAL (MISCELLANEOUS) IMPLANT
SNARE SHORT THROW 30M LRG OVAL (MISCELLANEOUS) IMPLANT
SPOT EX ENDOSCOPIC TATTOO (MISCELLANEOUS)
SYR INFLATION 60ML (SYRINGE) IMPLANT
TRAP ETRAP POLY (MISCELLANEOUS) IMPLANT
VARIJECT INJECTOR VIN23 (MISCELLANEOUS)
WATER STERILE IRR 250ML POUR (IV SOLUTION) ×3 IMPLANT
WIRE CRE 18-20MM 8CM F G (MISCELLANEOUS) IMPLANT

## 2017-11-02 NOTE — Anesthesia Procedure Notes (Signed)
Date/Time: 11/02/2017 7:41 AM Performed by: Cameron Ali, CRNA Pre-anesthesia Checklist: Patient identified, Emergency Drugs available, Suction available, Timeout performed and Patient being monitored Patient Re-evaluated:Patient Re-evaluated prior to induction Oxygen Delivery Method: Nasal cannula Placement Confirmation: positive ETCO2

## 2017-11-02 NOTE — Anesthesia Preprocedure Evaluation (Addendum)
Anesthesia Evaluation  Patient identified by MRN, date of birth, ID band  Reviewed: NPO status   History of Anesthesia Complications (+) PONV and history of anesthetic complications (ponv, slow wak up)  Airway Mallampati: II  TM Distance: >3 FB Neck ROM: full    Dental no notable dental hx.    Pulmonary asthma ,  pulm nodule : stable   Pulmonary exam normal        Cardiovascular Exercise Tolerance: Good Normal cardiovascular exam+ dysrhythmias (ablation 15 yrs ago) Supra Ventricular Tachycardia      Neuro/Psych Chronic pain syndrome negative neurological ROS  negative psych ROS   GI/Hepatic Neg liver ROS, GERD  Medicated and Controlled,  Endo/Other  Hypothyroidism   Renal/GU Low potassium;  Magnesium metabolism disorder   Bladder retention; stim implant  Adrenal adenoma, right, benign.  Bladder  Self cath. negative genitourinary   Musculoskeletal Lbpain;  Tailbone injury; History of pelvic fracture    Abdominal   Peds  Hematology negative hematology ROS (+)   Anesthesia Other Findings Pt had propofol 10/2016 without problems. Pt ok using propofol today.  TIVA.  Reproductive/Obstetrics                            Anesthesia Physical Anesthesia Plan  ASA: III  Anesthesia Plan: General   Post-op Pain Management:    Induction:   PONV Risk Score and Plan:   Airway Management Planned:   Additional Equipment:   Intra-op Plan:   Post-operative Plan:   Informed Consent: I have reviewed the patients History and Physical, chart, labs and discussed the procedure including the risks, benefits and alternatives for the proposed anesthesia with the patient or authorized representative who has indicated his/her understanding and acceptance.     Plan Discussed with: CRNA  Anesthesia Plan Comments:         Anesthesia Quick Evaluation

## 2017-11-02 NOTE — Transfer of Care (Signed)
Immediate Anesthesia Transfer of Care Note  Patient: Kayla Curry  Procedure(s) Performed: ESOPHAGOGASTRODUODENOSCOPY (EGD) WITH PROPOFOL (N/A )  Patient Location: PACU  Anesthesia Type: General  Level of Consciousness: awake, alert  and patient cooperative  Airway and Oxygen Therapy: Patient Spontanous Breathing and Patient connected to supplemental oxygen  Post-op Assessment: Post-op Vital signs reviewed, Patient's Cardiovascular Status Stable, Respiratory Function Stable, Patent Airway and No signs of Nausea or vomiting  Post-op Vital Signs: Reviewed and stable  Complications: No apparent anesthesia complications

## 2017-11-02 NOTE — H&P (Signed)
Lucilla Lame, MD [address] [place], [place] [address] Phone:[phone] Fax : [phone]  Primary Care Physician:  [name], MD Primary Gastroenterologist:  Dr. Allen Norris  Pre-Procedure History & Physical: HPI:  AANIYA STERBA is a 47 y.o. female is here for an endoscopy.   Past Medical History:  Diagnosis Date  . Adrenal adenoma, right   . Asthma   . Chronic pain syndrome   . GERD (gastroesophageal reflux disease)   . Gitelman syndrome    DISORDER MAGNESIUM METABOLISM  . History of pelvic fracture   . History of supraventricular tachycardia    PER PT ON 02-28-2013 NO ISSUES SINCE ABLATION IN 2005  . Hypokalemia    CHRONIC  . Hypothyroidism   . 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 Magnesium metabolism disorder NEPHROLOGIST-  DR [name] ([facility] [employer])   [place]  . Malfunction of device    INTERSTIM  . Neurogenic bladder   . PONV (postoperative nausea and vomiting)    reports PONV and rash with propofol  . S/P ablation of ventricular arrhythmia HX SVT--  2005   [place]--  LAST VISIT 2012 -- PT RELEASED FROM CARE ON PRN BASIS  . Self-catheterizes urinary bladder   . Urinary retention    RESIDUAL FROM PELVIC FX INJURY---Self cath TID and prn  . Wears contact lenses     Past Surgical History:  Procedure Laterality Date  . APPENDECTOMY  1994   EXPL. LAP.  Marland Kitchen CARDIAC ELECTROPHYSIOLOGY STUDY AND ABLATION  2005- AT [facility]   SVT--  PT STATES NO ISSUES SINCE   . INTERSTIM IMPLANT PLACEMENT  2009[facility]  . INTERSTIM IMPLANT PLACEMENT  10/05/2011   Procedure: Barrie Lyme IMPLANT FIRST STAGE;  Surgeon: [name], MD;  Location: [facility];  Service: Urology;  Laterality: N/A;  . INTERSTIM IMPLANT PLACEMENT  10/05/2011   Procedure: Barrie Lyme IMPLANT SECOND STAGE;  Surgeon: [name], MD;  Location:  [facility];  Service: Urology;  Laterality: N/A;  . INTERSTIM IMPLANT PLACEMENT N/A 11/13/2016   Procedure: Barrie Lyme IMPLANT FIRST STAGE;  Surgeon: [name], MD;  Location: [facility];  Service: Urology;  Laterality: N/A;  . INTERSTIM IMPLANT PLACEMENT N/A 11/13/2016   Procedure: Barrie Lyme IMPLANT SECOND STAGE;  Surgeon: [name], MD;  Location: [facility];  Service: Urology;  Laterality: N/A;  . INTERSTIM IMPLANT REMOVAL N/A 11/13/2016   Procedure: REMOVAL OF INTERSTIM IMPLANT ONE AND TWO;  Surgeon: [name], MD;  Location: [facility];  Service: Urology;  Laterality: N/A;  . INTERSTIM IMPLANT REVISION N/A 03/06/2013   Procedure: REVISION OF Barrie Lyme;  Surgeon: [name], MD;  [facility];  Service: Urology;  Laterality: N/A;  . LAPAROSCOPIC CHOLECYSTECTOMY  2001  . TONSILLECTOMY  1998  . VAGINAL HYSTERECTOMY  2003    Prior to Admission medications   Medication Sig Start Date End Date Taking? Authorizing Provider  baclofen (LIORESAL) 20 MG tablet TAKE 1 TABLET (20 MG TOTAL) BY MOUTH 4 (FOUR) TIMES DAILY. 06/06/17  Yes [name], MD  EPINEPHrine (EPIPEN 2-PAK) 0.3 mg/0.3 mL IJ SOAJ injection Inject into the muscle. 08/04/10  Yes [provider]  esomeprazole (NEXIUM) 40 MG capsule Take 1 capsule (40 mg total) by mouth daily before breakfast. 06/04/12  Yes Ladene Artist, MD  gabapentin (NEURONTIN) 600 MG tablet TAKE 1 TABLET (600 MG TOTAL) BY MOUTH 3 (THREE) TIMES DAILY. 11/01/17  Yes [name], MD  metaproterenol (ALUPENT) 20 MG tablet Take 1 tablet (20 mg total) by mouth 3 (three) times daily as needed. 02/14/16  Yes [name], MD  oxyCODONE-acetaminophen (PERCOCET) 7.5-325 MG tablet One tablet every 4-5 hours as needed 10/15/17  Yes [name], MD  Potassium 99 MG TABS Take 1 tablet by mouth 2 (two) times daily.    Yes [provider]  promethazine (PHENERGAN) 25 MG tablet Take 1 tablet (25 mg total) by mouth every 6 (six) hours as needed for nausea. 10/06/17  Yes [name], MD  spironolactone (ALDACTONE) 100 MG tablet TAKE 1 TABLET (100 MG TOTAL) BY MOUTH 2 (TWO) TIMES DAILY. 10/11/16  Yes [name], MD  SYNTHROID 75 MCG tablet TAKE 1 TABLET BY MOUTH DAILY. PLEASE SCHEDULE OFFICE VISIT FOR FOLLOWUP 06/19/17  Yes [name], MD  tiZANidine (ZANAFLEX) 4 MG tablet TAKE 1 TABLET (4 MG TOTAL) BY MOUTH 2 (TWO) TIMES DAILY. 11/01/17  Yes [name], MD  HYDROcodone-homatropine [facility]) 5-1.5 MG/5ML syrup Take 5 mLs by mouth every 4 (four) hours as needed for cough. Patient not taking: Reported on 10/30/2017 09/21/17   Posey Boyer, MD    Allergies as of 10/30/2017 - Review Complete 10/30/2017  Allergen Reaction Noted  . Advair diskus [fluticasone-salmeterol] Shortness Of Breath 10/05/2011  . Albuterol sulfate Shortness Of Breath 06/10/2015  . Atrovent Shortness Of Breath 10/05/2011  . Bethanechol Shortness Of Breath, Rash, and Other (See Comments) 10/02/2011  . Budesonide-formoterol fumarate Shortness Of Breath 10/02/2011  . Influenza vac split quad Anaphylaxis 10/05/2011  . Influenza vaccines Anaphylaxis 05/18/2017  . Ondansetron Rash 07/02/2012  . Xopenex [levalbuterol] Shortness Of Breath 10/02/2011  . Compazine Other (See Comments) 10/02/2011  . Fluoride preparations  10/05/2011  . Lorazepam Other (See Comments) 10/02/2011  . Levofloxacin Rash 10/02/2011  . Potassium-containing compounds Nausea And Vomiting 10/05/2011  . Propofol Nausea And Vomiting and Rash 10/30/2017  . Zofran [ondansetron hcl] Rash 02/28/2013    Family History  Problem Relation Age of Onset  . Hypertension Father   . Colon cancer Neg Hx     Social History   Socioeconomic History  . Marital status: Married    Spouse name: Not on file  . Number of children: 2  . Years of education: Not on file  . Highest  education level: Not on file  Social Needs  . Financial resource strain: Not on file  . Food insecurity - worry: Not on file  . Food insecurity - inability: Not on file  . Transportation needs - medical: Not on file  . Transportation needs - non-medical: Not on file  Occupational History  . Occupation: Optician, dispensing: Advertising copywriter: [employer]  Tobacco Use  . Smoking status: Never Smoker  . Smokeless tobacco: Never Used  Substance and Sexual Activity  . Alcohol use: No  . Drug use: No  . Sexual activity: Not on file  Other Topics Concern  . Not on file  Social History Narrative  . Not on file    Review of Systems: See HPI, otherwise negative ROS  Physical Exam: BP (!) 143/80   Pulse 85   Temp 99.3 F (37.4 C) (Temporal)   Resp 16   Ht 5\' 2"  (1.575 m)   Wt 108 lb (49 kg)  SpO2 100%   BMI 19.75 kg/m  General:   Alert,  pleasant and cooperative in NAD Head:  Normocephalic and atraumatic. Neck:  Supple; no masses or thyromegaly. Lungs:  Clear throughout to auscultation.    Heart:  Regular rate and rhythm. Abdomen:  Soft, nontender and nondistended. Normal bowel sounds, without guarding, and without rebound.   Neurologic:  Alert and  oriented x4;  grossly normal neurologically.  Impression/Plan: LAEL WETHERBEE is here for an endoscopy to be performed for epigastric pain  Risks, benefits, limitations, and alternatives regarding  endoscopy have been reviewed with the patient.  Questions have been answered.  All parties agreeable.   Lucilla Lame, MD  11/02/2017, 7:21 AM

## 2017-11-02 NOTE — Op Note (Signed)
[facility] Gastroenterology Patient Name: Kayla Curry Procedure Date: 11/02/2017 7:27 AM MRN: [id number] Account #: [id number] Date of Birth: 1970-10-29 Admit Type: Outpatient Age: 47 Room: [facility] OR ROOM 01 Gender: Female Note Status: Finalized Procedure:            Upper GI endoscopy Indications:          Epigastric abdominal pain Providers:            Lucilla Lame MD, MD Referring MD:         Kirstie Peri. Caryn Section, MD (Referring MD) Medicines:            Propofol per Anesthesia Complications:        No immediate complications. Procedure:            Pre-Anesthesia Assessment:                       - Prior to the procedure, a History and Physical was                        performed, and patient medications and allergies were                        reviewed. The patient's tolerance of previous                        anesthesia was also reviewed. The risks and benefits of                        the procedure and the sedation options and risks were                        discussed with the patient. All questions were                        answered, and informed consent was obtained. Prior                        Anticoagulants: The patient has taken no previous                        anticoagulant or antiplatelet agents. ASA Grade                        Assessment: II - A patient with mild systemic disease.                        After reviewing the risks and benefits, the patient was                        deemed in satisfactory condition to undergo the                        procedure.                       After obtaining informed consent, the endoscope was                        passed under direct vision. Throughout the procedure,  the patient's blood pressure, pulse, and oxygen                        saturations were monitored continuously. The Olympus                        [id number]-HQ190 Endoscope (S#. [id number]) was introduced       through the mouth, and advanced to the second part of                        duodenum. The upper GI endoscopy was accomplished                        without difficulty. The patient tolerated the procedure                        well. Findings:      The examined esophagus was normal.      A large amount of food (residue) was found in the entire examined       stomach.      Localized mild inflammation characterized by erythema was found in the       gastric antrum. Biopsies were taken with a cold forceps for histology.      Patchy moderate mucosal changes characterized by flattening were found       in the entire duodenum. Biopsies were taken with a cold forceps for       histology. Impression:           - Normal esophagus.                       - A large amount of food (residue) in the stomach.                       - Gastritis. Biopsied.                       - Mucosal changes in the duodenum. Biopsied. Recommendation:       - Discharge patient to home.                       - Resume previous diet.                       - Continue present medications.                       - Await pathology results.                       - Do a gastric emptying study. Procedure Code(s):    --- Professional ---                       [phone], Esophagogastroduodenoscopy, flexible, transoral;                        with biopsy, single or multiple Diagnosis Code(s):    --- Professional ---                       R10.13, Epigastric pain  K31.89, Other diseases of stomach and duodenum                       K29.70, Gastritis, unspecified, without bleeding CPT copyright 2016 American Medical Association. All rights reserved. The codes documented in this report are preliminary and upon coder review may  be revised to meet current compliance requirements. Lucilla Lame MD, MD 11/02/2017 7:54:33 AM This report has been signed electronically. Number of Addenda: 0 Note Initiated On:  11/02/2017 7:27 AM      [facility]

## 2017-11-02 NOTE — Anesthesia Postprocedure Evaluation (Signed)
Anesthesia Post Note  Patient: Kayla Curry  Procedure(s) Performed: ESOPHAGOGASTRODUODENOSCOPY (EGD) WITH PROPOFOL (N/A )  Patient location during evaluation: PACU Anesthesia Type: General Level of consciousness: awake and alert Pain management: pain level controlled Vital Signs Assessment: post-procedure vital signs reviewed and stable Respiratory status: spontaneous breathing, nonlabored ventilation, respiratory function stable and patient connected to nasal cannula oxygen Cardiovascular status: blood pressure returned to baseline and stable Postop Assessment: no apparent nausea or vomiting Anesthetic complications: no    Spike Desilets

## 2017-11-05 ENCOUNTER — Encounter: Payer: Self-pay | Admitting: Gastroenterology

## 2017-11-06 ENCOUNTER — Encounter: Payer: Self-pay | Admitting: Gastroenterology

## 2017-11-14 ENCOUNTER — Telehealth: Payer: Self-pay

## 2017-11-14 NOTE — Telephone Encounter (Signed)
-----   Message from Lucilla Lame, MD sent at 11/12/2017  9:41 AM EDT ----- Please have the patient come in for a follow up.

## 2017-11-14 NOTE — Telephone Encounter (Signed)
Pt has been scheduled for follow up appt 

## 2017-11-15 ENCOUNTER — Ambulatory Visit
Admission: RE | Admit: 2017-11-15 | Discharge: 2017-11-15 | Disposition: A | Discharge status: Home / Self Care | Payer: Managed Care, Other (non HMO) | Source: Ambulatory Visit | Attending: Gastroenterology | Admitting: Gastroenterology

## 2017-11-15 DIAGNOSIS — R112 Nausea with vomiting, unspecified: Secondary | ICD-10-CM

## 2017-11-15 MED ORDER — TECHNETIUM TC 99M SULFUR COLLOID
2.1700 | Freq: Once | INTRAVENOUS | Status: AC | PRN
  Administered 2017-11-15: 2.17 via INTRAVENOUS

## 2017-11-20 ENCOUNTER — Telehealth: Payer: Self-pay

## 2017-11-20 NOTE — Telephone Encounter (Signed)
-----   Message from Lucilla Lame, MD sent at 11/16/2017 10:53 AM EDT ----- Let the patient know that the gastric emptying was normal.

## 2017-11-20 NOTE — Telephone Encounter (Signed)
Pt notified of gastric emptying study results.  

## 2017-11-23 ENCOUNTER — Ambulatory Visit: Payer: Managed Care, Other (non HMO)

## 2017-11-29 ENCOUNTER — Encounter: Payer: Self-pay | Admitting: Gastroenterology

## 2017-11-29 ENCOUNTER — Ambulatory Visit: Payer: BLUE CROSS/BLUE SHIELD | Admitting: Gastroenterology

## 2017-11-29 VITALS — BP 153/70 | HR 100 | Ht 62.0 in | Wt 110.0 lb

## 2017-11-29 DIAGNOSIS — R1013 Epigastric pain: Secondary | ICD-10-CM

## 2017-11-29 DIAGNOSIS — R112 Nausea with vomiting, unspecified: Secondary | ICD-10-CM

## 2017-11-29 MED ORDER — SUCRALFATE 1 GM/10ML PO SUSP: 1.0000 g | Freq: Four times a day (QID) | ORAL | 2 refills | Status: DC

## 2017-11-29 NOTE — Progress Notes (Signed)
Primary Care Physician: [name], MD  Primary Gastroenterologist:  Dr. Lucilla Curry  No chief complaint on file.   HPI: Kayla Curry is a 47 y.o. female here after having an upper endoscopy.  The patient was found to have a large volume of food in her stomach at the time of the upper endoscopy and had a subsequent gastric emptying study that was normal.  The patient also had flattening villi throughout the visualized small bowel.  The biopsies of this showed no sign of celiac sprue.  The biopsies did show erosions with regenerating small bowel tissue.  The differential diagnosis included anti-inflammatory medications active Crohn's disease, ischemia or medication induced. The patient reports that she is not on any medications that are considered anti-inflammatory medications.  She does state that she takes potassium due to a chronically low potassium and she states that this may be upsetting her stomach.  She reports that her epigastric pain is worse than it has been in the past and she continues to have nausea and vomiting.  Current Outpatient Medications  Medication Sig Dispense Refill  . baclofen (LIORESAL) 20 MG tablet TAKE 1 TABLET (20 MG TOTAL) BY MOUTH 4 (FOUR) TIMES DAILY. 120 tablet 5  . EPINEPHrine (EPIPEN 2-PAK) 0.3 mg/0.3 mL IJ SOAJ injection Inject into the muscle.    . esomeprazole (NEXIUM) 40 MG capsule Take 1 capsule (40 mg total) by mouth daily before breakfast. 30 capsule 11  . gabapentin (NEURONTIN) 600 MG tablet TAKE 1 TABLET (600 MG TOTAL) BY MOUTH 3 (THREE) TIMES DAILY. 90 tablet 1  . HYDROcodone-homatropine (HYCODAN) 5-1.5 MG/5ML syrup Take 5 mLs by mouth every 4 (four) hours as needed for cough. (Patient not taking: Reported on 10/30/2017) 120 mL 0  . metaproterenol (ALUPENT) 20 MG tablet Take 1 tablet (20 mg total) by mouth 3 (three) times daily as needed. 60 tablet 3  . oxyCODONE-acetaminophen (PERCOCET) 7.5-325 MG tablet One tablet every 4-5 hours as needed  120 tablet 0  . Potassium 99 MG TABS Take 1 tablet by mouth 2 (two) times daily.     . promethazine (PHENERGAN) 25 MG tablet Take 1 tablet (25 mg total) by mouth every 6 (six) hours as needed for nausea. 60 tablet 5  . spironolactone (ALDACTONE) 100 MG tablet TAKE 1 TABLET (100 MG TOTAL) BY MOUTH 2 (TWO) TIMES DAILY. 60 tablet 5  . SYNTHROID 75 MCG tablet TAKE 1 TABLET BY MOUTH DAILY. PLEASE SCHEDULE OFFICE VISIT FOR FOLLOWUP 30 tablet 11  . tiZANidine (ZANAFLEX) 4 MG tablet TAKE 1 TABLET (4 MG TOTAL) BY MOUTH 2 (TWO) TIMES DAILY. 60 tablet 4   No current facility-administered medications for this visit.     Allergies as of 11/29/2017 - Review Complete 11/02/2017  Allergen Reaction Noted  . Advair diskus [fluticasone-salmeterol] Shortness Of Breath 10/05/2011  . Albuterol sulfate Shortness Of Breath 06/10/2015  . Atrovent Shortness Of Breath 10/05/2011  . Bethanechol Shortness Of Breath, Rash, and Other (See Comments) 10/02/2011  . Budesonide-formoterol fumarate Shortness Of Breath 10/02/2011  . Influenza vac split quad Anaphylaxis 10/05/2011  . Influenza vaccines Anaphylaxis 05/18/2017  . Ondansetron Rash 07/02/2012  . Xopenex [levalbuterol] Shortness Of Breath 10/02/2011  . Compazine Other (See Comments) 10/02/2011  . Fluoride preparations  10/05/2011  . Lorazepam Other (See Comments) 10/02/2011  . Levofloxacin Rash 10/02/2011  . Potassium-containing compounds Nausea And Vomiting 10/05/2011  . Propofol Nausea And Vomiting and Rash 10/30/2017  . Zofran [ondansetron hcl] Rash 02/28/2013  ROS:  General: Negative for anorexia, weight loss, fever, chills, fatigue, weakness. ENT: Negative for hoarseness, difficulty swallowing , nasal congestion. CV: Negative for chest pain, angina, palpitations, dyspnea on exertion, peripheral edema.  Respiratory: Negative for dyspnea at rest, dyspnea on exertion, cough, sputum, wheezing.  GI: See history of present illness. GU:  Negative for  dysuria, hematuria, urinary incontinence, urinary frequency, nocturnal urination.  Endo: Negative for unusual weight change.    Physical Examination:   There were no vitals taken for this visit.  General: Well-nourished, well-developed in no acute distress.  Eyes: No icterus. Conjunctivae pink. Mouth: Oropharyngeal mucosa moist and pink , no lesions erythema or exudate. Lungs: Clear to auscultation bilaterally. Non-labored. Heart: Regular rate and rhythm, no murmurs rubs or gallops.  Abdomen: Bowel sounds are normal, nontender, nondistended, no hepatosplenomegaly or masses, no abdominal bruits or hernia , no rebound or guarding.   Extremities: No lower extremity edema. No clubbing or deformities. Neuro: Alert and oriented x 3.  Grossly intact. Skin: Warm and dry, no jaundice.   Psych: Alert and cooperative, normal mood and affect.  Labs:    Imaging Studies: Nm Gastric Emptying  Result Date: 11/15/2017 CLINICAL DATA:  Nausea and abdominal pain for 3 years with vomiting that stopped about 1.5 years ago, not diabetic EXAM: NUCLEAR MEDICINE GASTRIC EMPTYING SCAN TECHNIQUE: After oral ingestion of radiolabeled meal, sequential abdominal images were obtained for 4 hours. Percentage of activity emptying the stomach was calculated at 1 hour, 2 hour, 3 hour, and 4 hours. RADIOPHARMACEUTICALS:  2.16 mCi Tc-43m sulfur colloid in standardized meal COMPARISON:  None FINDINGS: Expected location of the stomach in the left upper quadrant. Ingested meal empties the stomach gradually over the course of the study. 33% emptied at 1 hr ( normal >= 10%) 73% emptied at 2 hr ( normal >= 40%) 91% emptied at 3 hr ( normal >= 70%) 90% emptied at 4 hr ( normal >= 90%) IMPRESSION: Normal gastric emptying study. Electronically Signed   By: Lavonia Dana M.D.   On: 11/15/2017 14:49    Assessment and Plan:   Kayla Curry is a 47 y.o. y/o female with ulcers in the duodenum with regeneration on her EGD and a differential  diagnosis including possible inflammatory bowel disease, ischemia versus a drug reaction such as NSAIDs. The patient is not taking any NSAIDs and reports that she may be having her issues due to potassium intake.  The patient states that she has done better with liquid potassium in the past and is wanting to know if she can start taking that.  The patient has been told that she should.  The patient also will be started on Carafate for the duodenal ulcerations.  She will take a liquid 4 times a day.  The patient has been explained the plan and agrees with it.    Kayla Lame, MD. Marval Regal   Note: This dictation was prepared with Dragon dictation along with smaller phrase technology. Any transcriptional errors that result from this process are unintentional.

## 2017-12-01 ENCOUNTER — Other Ambulatory Visit: Payer: Self-pay | Admitting: Family Medicine

## 2017-12-03 ENCOUNTER — Telehealth: Payer: Self-pay | Admitting: Gastroenterology

## 2017-12-03 NOTE — Telephone Encounter (Signed)
The patient should stop the Carafate and find out if she switched over to the liquid potassium which she thought may be causing her symptoms.  If she has and she is still having her symptoms then find that if she is still taking the Nexium.

## 2017-12-03 NOTE — Telephone Encounter (Signed)
Severe abd pain, nausea and vomiting with Karafate. What else can she take? Please call patient.

## 2017-12-05 NOTE — Telephone Encounter (Signed)
I clearly wrote it in my note as I told her I would do. If he will not do it then find out how much she is taking and we can prescribe it.

## 2017-12-05 NOTE — Telephone Encounter (Signed)
Pt stated she never restarted the potassium because Dr. Caryn Section would not prescribe it. She stated you were going to notify him to write the rx. She is however still taking the nexium. Please advise.   The office visit note did not specify clearly.

## 2017-12-06 ENCOUNTER — Other Ambulatory Visit: Payer: Self-pay

## 2017-12-06 NOTE — Telephone Encounter (Signed)
I should have answered this already please check that I have.

## 2017-12-06 NOTE — Telephone Encounter (Signed)
She said she was taking the liquid potassium a couple of years ago and cannot remember meq or how often she was taking. Please advise me on how to send this.

## 2017-12-06 NOTE — Telephone Encounter (Signed)
LVM for pt to return my call.

## 2017-12-11 ENCOUNTER — Telehealth: Payer: Self-pay | Admitting: Gastroenterology

## 2017-12-11 NOTE — Telephone Encounter (Signed)
Patient called with potassium powder pack 10 meq. Once a day.

## 2017-12-11 NOTE — Telephone Encounter (Signed)
Please prescribed this patient potassium powder packs 10 mEq to be taken once a day.

## 2017-12-11 NOTE — Telephone Encounter (Signed)
*  STAT* If patient is at the pharmacy, call can be transferred to refill team.   1. Which medications need to be refilled? (please list name of each medication and dose if known) Nexium 40 mg (generic)  2. Which pharmacy/location (including street and city if local pharmacy) is medication to be sent to? [facility]   3. Do they need a 30 day or 90 day supply? 90 day  Can she take 40 mg twice a day?

## 2017-12-12 ENCOUNTER — Other Ambulatory Visit: Payer: Self-pay

## 2017-12-12 NOTE — Telephone Encounter (Signed)
LVM for patient callback.   Questions for patient:  1. Is she allergic to potassium? 2. What is her reaction? 3. What would she like Dr. Allen Norris to prescribe for her?

## 2017-12-12 NOTE — Telephone Encounter (Signed)
Please find out what has worked for her in the past and lets give that to her.

## 2017-12-13 ENCOUNTER — Other Ambulatory Visit: Payer: Self-pay

## 2017-12-13 DIAGNOSIS — E876 Hypokalemia: Secondary | ICD-10-CM

## 2017-12-13 MED ORDER — POTASSIUM CHLORIDE 20 MEQ/15ML (10%) PO SOLN: 10.0000 meq | Freq: Every day | ORAL | 0 refills | Status: DC

## 2017-12-14 ENCOUNTER — Encounter: Payer: Self-pay | Admitting: Family Medicine

## 2017-12-14 ENCOUNTER — Ambulatory Visit: Payer: BLUE CROSS/BLUE SHIELD | Admitting: Family Medicine

## 2017-12-14 VITALS — BP 112/64 | HR 81 | Temp 98.6°F | Wt 108.6 lb

## 2017-12-14 DIAGNOSIS — R05 Cough: Secondary | ICD-10-CM | POA: Diagnosis not present

## 2017-12-14 DIAGNOSIS — R059 Cough, unspecified: Secondary | ICD-10-CM

## 2017-12-14 DIAGNOSIS — R509 Fever, unspecified: Secondary | ICD-10-CM

## 2017-12-14 DIAGNOSIS — J0100 Acute maxillary sinusitis, unspecified: Secondary | ICD-10-CM

## 2017-12-14 LAB — POCT INFLUENZA A/B
INFLUENZA B, POC: NEGATIVE
Influenza A, POC: NEGATIVE

## 2017-12-14 MED ORDER — AZITHROMYCIN 250 MG PO TABS: ORAL_TABLET | ORAL | 0 refills | Status: DC

## 2017-12-14 MED ORDER — PREDNISONE 5 MG PO TABS: 5.0000 mg | ORAL_TABLET | Freq: Every day | ORAL | 0 refills | Status: DC

## 2017-12-14 NOTE — Progress Notes (Signed)
Patient: Kayla Curry Female    DOB: 1971/01/20   47 y.o.   MRN: [id number] Visit Date: 12/14/2017  Today's Provider: Vernie Murders, PA   Chief Complaint  Patient presents with  . URI   Subjective:    URI   This is a new problem. Episode onset: 1 week ago. The problem has been gradually worsening. The maximum temperature recorded prior to her arrival was 100.4 - 100.9 F. Associated symptoms include congestion, coughing, ear pain, headaches, nausea, rhinorrhea, sneezing and a sore throat. Associated symptoms comments: Fever . She has tried acetaminophen (OTC medications) for the symptoms. The treatment provided no relief.      Past Medical History:  Diagnosis Date  . Adrenal adenoma, right   . Asthma   . Chronic pain syndrome   . GERD (gastroesophageal reflux disease)   . Gitelman syndrome    DISORDER MAGNESIUM METABOLISM  . History of pelvic fracture   . History of supraventricular tachycardia    PER PT ON 02-28-2013 NO ISSUES SINCE ABLATION IN 2005  . Hypokalemia    CHRONIC  . Hypothyroidism   . 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 Magnesium metabolism disorder NEPHROLOGIST-  DR [name] ([facility] [place])   [place]  . Malfunction of device    INTERSTIM  . Neurogenic bladder   . PONV (postoperative nausea and vomiting)    reports PONV and rash with propofol  . S/P ablation of ventricular arrhythmia HX SVT--  2005   [place]--  LAST VISIT 2012 -- PT RELEASED FROM CARE ON PRN BASIS  . Self-catheterizes urinary bladder   . Urinary retention    RESIDUAL FROM PELVIC FX INJURY---Self cath TID and prn  . Wears contact lenses    Past Surgical History:  Procedure Laterality Date  . APPENDECTOMY  1994   EXPL. LAP.  Marland Kitchen CARDIAC ELECTROPHYSIOLOGY STUDY AND ABLATION  2005- AT [facility]   SVT--  PT STATES NO ISSUES SINCE   . ESOPHAGOGASTRODUODENOSCOPY (EGD) WITH  PROPOFOL N/A 11/02/2017   Procedure: ESOPHAGOGASTRODUODENOSCOPY (EGD) WITH PROPOFOL;  Surgeon: Lucilla Lame, MD;  Location: [place];  Service: Endoscopy;  Laterality: N/A;  . INTERSTIM IMPLANT PLACEMENT  [facility]  . INTERSTIM IMPLANT PLACEMENT  10/05/2011   Procedure: Barrie Lyme IMPLANT FIRST STAGE;  Surgeon: [name], MD;  Location: [facility];  Service: Urology;  Laterality: N/A;  . INTERSTIM IMPLANT PLACEMENT  10/05/2011   Procedure: Barrie Lyme IMPLANT SECOND STAGE;  Surgeon: [name], MD;  Location: [facility];  Service: Urology;  Laterality: N/A;  . INTERSTIM IMPLANT PLACEMENT N/A 11/13/2016   Procedure: Barrie Lyme IMPLANT FIRST STAGE;  Surgeon: [name], MD;  Location: [facility];  Service: Urology;  Laterality: N/A;  . INTERSTIM IMPLANT PLACEMENT N/A 11/13/2016   Procedure: Barrie Lyme IMPLANT SECOND STAGE;  Surgeon: [name], MD;  Location: [facility];  Service: Urology;  Laterality: N/A;  . INTERSTIM IMPLANT REMOVAL N/A 11/13/2016   Procedure: REMOVAL OF INTERSTIM IMPLANT ONE AND TWO;  Surgeon: [name], MD;  Location[facility];  Service: Urology;  Laterality: N/A;  . INTERSTIM IMPLANT REVISION N/A 03/06/2013   Procedure: REVISION OF Barrie Lyme;  Surgeon: [name], MD;  Location: [facility];  Service: Urology;  Laterality: N/A;  . LAPAROSCOPIC CHOLECYSTECTOMY  2001  .  TONSILLECTOMY  1998  . VAGINAL HYSTERECTOMY  2003   Family History  Problem Relation Age of Onset  . Hypertension Father   . Colon cancer Neg Hx    Allergies  Allergen Reactions  . Advair Diskus [Fluticasone-Salmeterol] Shortness Of Breath  . Albuterol Sulfate Shortness Of Breath  . Atrovent Shortness Of Breath  . Bethanechol Shortness Of Breath, Rash and Other (See Comments)    RESPIRATORY DISTRESS  . Budesonide-Formoterol Fumarate Shortness Of  Breath  . Influenza Vac Split Quad Anaphylaxis  . Influenza Vaccines Anaphylaxis and Shortness Of Breath  . Ipratropium Shortness Of Breath  . Ondansetron Rash  . Shellfish Allergy Anaphylaxis and Shortness Of Breath  . Xopenex [Levalbuterol] Shortness Of Breath  . Compazine Other (See Comments)    DYSTONIC  . Fluoride Preparations   . Lorazepam Other (See Comments)    "HAS OPPOSITE EFFECT"  . Levofloxacin Rash  . Other Rash    DYSTONIC  . Potassium-Containing Compounds Nausea And Vomiting    Just the po potassium  No problem with iv  . Propofol Nausea And Vomiting and Rash  . Zofran [Ondansetron Hcl] Rash    Current Outpatient Medications:  .  baclofen (LIORESAL) 20 MG tablet, TAKE 1 TABLET (20 MG TOTAL) BY MOUTH 4 (FOUR) TIMES DAILY., Disp: 120 tablet, Rfl: 5 .  EPINEPHrine (EPIPEN 2-PAK) 0.3 mg/0.3 mL IJ SOAJ injection, Inject into the muscle., Disp: , Rfl:  .  esomeprazole (NEXIUM) 40 MG capsule, Take 1 capsule (40 mg total) by mouth daily before breakfast., Disp: 30 capsule, Rfl: 11 .  gabapentin (NEURONTIN) 600 MG tablet, TAKE 1 TABLET (600 MG TOTAL) BY MOUTH 3 (THREE) TIMES DAILY., Disp: 90 tablet, Rfl: 1 .  metaproterenol (ALUPENT) 20 MG tablet, Take 1 tablet (20 mg total) by mouth 3 (three) times daily as needed., Disp: 60 tablet, Rfl: 3 .  oxyCODONE-acetaminophen (PERCOCET) 7.5-325 MG tablet, One tablet every 4-5 hours as needed, Disp: 120 tablet, Rfl: 0 .  Potassium 99 MG TABS, Take 1 tablet by mouth 2 (two) times daily. , Disp: , Rfl:  .  potassium chloride 20 MEQ/15ML (10%) SOLN, Take 7.5 mLs (10 mEq total) by mouth daily for 30 doses., Disp: 225 mL, Rfl: 0 .  promethazine (PHENERGAN) 25 MG tablet, Take 1 tablet (25 mg total) by mouth every 6 (six) hours as needed for nausea., Disp: 60 tablet, Rfl: 5 .  spironolactone (ALDACTONE) 100 MG tablet, TAKE 1 TABLET (100 MG TOTAL) BY MOUTH 2 (TWO) TIMES DAILY., Disp: 60 tablet, Rfl: 5 .  SYNTHROID 75 MCG tablet, TAKE 1 TABLET BY  MOUTH DAILY. PLEASE SCHEDULE OFFICE VISIT FOR FOLLOWUP, Disp: 30 tablet, Rfl: 11 .  tiZANidine (ZANAFLEX) 4 MG tablet, TAKE 1 TABLET (4 MG TOTAL) BY MOUTH 2 (TWO) TIMES DAILY., Disp: 60 tablet, Rfl: 4 .  sucralfate (CARAFATE) 1 GM/10ML suspension, Take 10 mLs (1 g total) by mouth 4 (four) times daily. (Patient not taking: Reported on 12/14/2017), Disp: 420 mL, Rfl: 2  Review of Systems  Constitutional: Positive for fever.  HENT: Positive for congestion, ear pain, rhinorrhea, sneezing and sore throat.   Respiratory: Positive for cough.   Cardiovascular: Negative.   Gastrointestinal: Positive for nausea.  Neurological: Positive for headaches.   Social History   Tobacco Use  . Smoking status: Never Smoker  . Smokeless tobacco: Never Used  Substance Use Topics  . Alcohol use: No   Objective:   BP 112/64 (BP Location: Right Arm, Patient Position: Sitting, Cuff Size:  Normal)   Pulse 81   Temp 98.6 F (37 C) (Oral)   Wt 108 lb 9.6 oz (49.3 kg)   SpO2 99%   BMI 19.86 kg/m   Physical Exam  Constitutional: She is oriented to person, place, and time. She appears well-developed and well-nourished. No distress.  HENT:  Head: Normocephalic and atraumatic.  Right Ear: Hearing and external ear normal.  Left Ear: Hearing and external ear normal.  Nose: Nose normal.  Mouth/Throat: No oropharyngeal exudate.  Slight redness of tonsillar pillars and posterior pharynx. No transillumination of maxillary sinuses. Minimal discomfort in sinus areas.  Eyes: Conjunctivae and lids are normal. Right eye exhibits no discharge. Left eye exhibits no discharge. No scleral icterus.  Neck: Neck supple.  Cardiovascular: Normal rate.  Pulmonary/Chest: Effort normal. No respiratory distress. She has no wheezes. She has no rales.  Musculoskeletal: Normal range of motion.  Lymphadenopathy:    She has no cervical adenopathy.  Neurological: She is alert and oriented to person, place, and time.  Skin: Skin is  intact. No lesion and no rash noted.  Psychiatric: She has a normal mood and affect. Her speech is normal and behavior is normal. Thought content normal.       Assessment & Plan:     1. Cough Onset with sensation of congestion, non-productive cough and sore throat with fever over the past week. Flu test negative. May use Tessalon Perles and Robitussin-DM she has at home to control cough. Suspect cough secondary to PND from sinusitis. Treat as below. Recheck prn. - POCT Influenza A/B  2. Fever, unspecified fever cause Spike of fever to 100.8 yesterday. Having general malaise, cough and sore throat today. Flu test negative. Suspect secondary to sinusitis with PND. May use Tylenol or Advil prn. - POCT Influenza A/B  3. Subacute maxillary sinusitis Congestion and cough with PND over the past week. Some pressure sensation in the left ear and sore throat. No transillumination of the maxillary sinuses. With history of asthma and inability to take most inhalers, will treat with Azithromycin and a prednisone taper for 6 days. Increase fluid intake and home to rest. Recheck if no better in 5-7 days. - azithromycin (ZITHROMAX) 250 MG tablet; Take 2 tablets by mouth the first day then one daily for 4 days.  Dispense: 6 tablet; Refill: 0 - predniSONE (DELTASONE) 5 MG tablet; Take 1 tablet (5 mg total) by mouth daily with breakfast. Taper dose daily starting with 6 tablets the first day (6,5,4,3,2,1).  Dispense: 21 tablet; Refill: [place], PA  [facility]

## 2017-12-16 ENCOUNTER — Inpatient Hospital Stay
Admission: EM | Admit: 2017-12-16 | Discharge: 2017-12-19 | DRG: 640 | Disposition: A | Discharge status: Home / Self Care | Payer: BLUE CROSS/BLUE SHIELD | Attending: Internal Medicine | Admitting: Internal Medicine

## 2017-12-16 ENCOUNTER — Other Ambulatory Visit: Payer: Self-pay

## 2017-12-16 ENCOUNTER — Inpatient Hospital Stay: Payer: BLUE CROSS/BLUE SHIELD

## 2017-12-16 DIAGNOSIS — I10 Essential (primary) hypertension: Secondary | ICD-10-CM | POA: Diagnosis present

## 2017-12-16 DIAGNOSIS — R0602 Shortness of breath: Secondary | ICD-10-CM

## 2017-12-16 DIAGNOSIS — T402X1A Poisoning by other opioids, accidental (unintentional), initial encounter: Secondary | ICD-10-CM | POA: Diagnosis not present

## 2017-12-16 DIAGNOSIS — M79606 Pain in leg, unspecified: Secondary | ICD-10-CM | POA: Diagnosis not present

## 2017-12-16 DIAGNOSIS — D3501 Benign neoplasm of right adrenal gland: Secondary | ICD-10-CM | POA: Diagnosis not present

## 2017-12-16 DIAGNOSIS — G723 Periodic paralysis: Secondary | ICD-10-CM | POA: Diagnosis not present

## 2017-12-16 DIAGNOSIS — E039 Hypothyroidism, unspecified: Secondary | ICD-10-CM | POA: Diagnosis present

## 2017-12-16 DIAGNOSIS — Z91013 Allergy to seafood: Secondary | ICD-10-CM

## 2017-12-16 DIAGNOSIS — E2681 Bartter's syndrome: Secondary | ICD-10-CM | POA: Diagnosis present

## 2017-12-16 DIAGNOSIS — M79605 Pain in left leg: Secondary | ICD-10-CM

## 2017-12-16 DIAGNOSIS — N319 Neuromuscular dysfunction of bladder, unspecified: Secondary | ICD-10-CM | POA: Diagnosis present

## 2017-12-16 DIAGNOSIS — R339 Retention of urine, unspecified: Secondary | ICD-10-CM | POA: Diagnosis not present

## 2017-12-16 DIAGNOSIS — E876 Hypokalemia: Secondary | ICD-10-CM | POA: Diagnosis not present

## 2017-12-16 DIAGNOSIS — K2950 Unspecified chronic gastritis without bleeding: Secondary | ICD-10-CM | POA: Diagnosis not present

## 2017-12-16 DIAGNOSIS — J9600 Acute respiratory failure, unspecified whether with hypoxia or hypercapnia: Secondary | ICD-10-CM | POA: Diagnosis not present

## 2017-12-16 DIAGNOSIS — Z881 Allergy status to other antibiotic agents status: Secondary | ICD-10-CM

## 2017-12-16 DIAGNOSIS — F4323 Adjustment disorder with mixed anxiety and depressed mood: Secondary | ICD-10-CM | POA: Diagnosis not present

## 2017-12-16 DIAGNOSIS — K219 Gastro-esophageal reflux disease without esophagitis: Secondary | ICD-10-CM | POA: Diagnosis present

## 2017-12-16 DIAGNOSIS — Z888 Allergy status to other drugs, medicaments and biological substances status: Secondary | ICD-10-CM

## 2017-12-16 DIAGNOSIS — D3500 Benign neoplasm of unspecified adrenal gland: Secondary | ICD-10-CM | POA: Diagnosis present

## 2017-12-16 DIAGNOSIS — M79604 Pain in right leg: Secondary | ICD-10-CM

## 2017-12-16 DIAGNOSIS — G894 Chronic pain syndrome: Secondary | ICD-10-CM | POA: Diagnosis present

## 2017-12-16 DIAGNOSIS — Z887 Allergy status to serum and vaccine status: Secondary | ICD-10-CM

## 2017-12-16 DIAGNOSIS — R4182 Altered mental status, unspecified: Secondary | ICD-10-CM | POA: Diagnosis not present

## 2017-12-16 DIAGNOSIS — R079 Chest pain, unspecified: Secondary | ICD-10-CM | POA: Diagnosis not present

## 2017-12-16 DIAGNOSIS — Z79899 Other long term (current) drug therapy: Secondary | ICD-10-CM

## 2017-12-16 DIAGNOSIS — I469 Cardiac arrest, cause unspecified: Secondary | ICD-10-CM | POA: Diagnosis not present

## 2017-12-16 DIAGNOSIS — R252 Cramp and spasm: Secondary | ICD-10-CM | POA: Diagnosis present

## 2017-12-16 DIAGNOSIS — Z7952 Long term (current) use of systemic steroids: Secondary | ICD-10-CM

## 2017-12-16 DIAGNOSIS — E875 Hyperkalemia: Secondary | ICD-10-CM | POA: Diagnosis not present

## 2017-12-16 DIAGNOSIS — E279 Disorder of adrenal gland, unspecified: Secondary | ICD-10-CM | POA: Diagnosis not present

## 2017-12-16 DIAGNOSIS — R0902 Hypoxemia: Secondary | ICD-10-CM

## 2017-12-16 LAB — CBC
HCT: 37.8 % (ref 35.0–47.0)
HCT: 38.1 % (ref 35.0–47.0)
Hemoglobin: 13.1 g/dL (ref 12.0–16.0)
Hemoglobin: 13.3 g/dL (ref 12.0–16.0)
MCH: 31.1 pg (ref 26.0–34.0)
MCH: 31 pg (ref 26.0–34.0)
MCHC: 34.7 g/dL (ref 32.0–36.0)
MCHC: 34.8 g/dL (ref 32.0–36.0)
MCV: 89.6 fL (ref 80.0–100.0)
MCV: 89 fL (ref 80.0–100.0)
Platelets: 265 10*3/uL (ref 150–440)
Platelets: 279 10*3/uL (ref 150–440)
RBC: 4.21 MIL/uL (ref 3.80–5.20)
RBC: 4.28 MIL/uL (ref 3.80–5.20)
RDW: 12.6 % (ref 11.5–14.5)
RDW: 12.8 % (ref 11.5–14.5)
WBC: 11.4 10*3/uL — ABNORMAL HIGH (ref 3.6–11.0)
WBC: 16.8 10*3/uL — ABNORMAL HIGH (ref 3.6–11.0)

## 2017-12-16 LAB — CKMB (ARMC ONLY): CK, MB: 2.2 ng/mL (ref 0.5–5.0)

## 2017-12-16 LAB — BASIC METABOLIC PANEL
ANION GAP: 8 (ref 5–15)
Anion gap: 6 (ref 5–15)
BUN: 11 mg/dL (ref 6–20)
BUN: 8 mg/dL (ref 6–20)
CO2: 29 mmol/L (ref 22–32)
CO2: 29 mmol/L (ref 22–32)
Calcium: 8.7 mg/dL — ABNORMAL LOW (ref 8.9–10.3)
Calcium: 8.1 mg/dL — ABNORMAL LOW (ref 8.9–10.3)
Chloride: 106 mmol/L (ref 101–111)
Chloride: 106 mmol/L (ref 101–111)
Creatinine, Ser: 0.62 mg/dL (ref 0.44–1.00)
Creatinine, Ser: 0.58 mg/dL (ref 0.44–1.00)
GFR calc Af Amer: >60 mL/min (ref >60)
GFR calc non Af Amer: >60 mL/min (ref >60)
GFR calc non Af Amer: >60 mL/min (ref >60)
Glucose, Bld: 95 mg/dL (ref 65–99)
Glucose, Bld: 106 mg/dL — ABNORMAL HIGH (ref 65–99)
Potassium: <2 mmol/L — CL (ref 3.5–5.1)
Sodium: 141 mmol/L (ref 135–145)
Sodium: 143 mmol/L (ref 135–145)

## 2017-12-16 LAB — PHOSPHORUS: PHOSPHORUS: 2.1 mg/dL — AB (ref 2.5–4.6)

## 2017-12-16 LAB — GLUCOSE, CAPILLARY
GLUCOSE-CAPILLARY: 102 mg/dL — AB (ref 65–99)
Glucose-Capillary: 83 mg/dL (ref 65–99)

## 2017-12-16 LAB — PROTIME-INR
INR: 0.88
PROTHROMBIN TIME: 11.9 s (ref 11.4–15.2)

## 2017-12-16 LAB — MAGNESIUM
MAGNESIUM: 1.6 mg/dL — AB (ref 1.7–2.4)
Magnesium: 1.5 mg/dL — ABNORMAL LOW (ref 1.7–2.4)

## 2017-12-16 LAB — LACTIC ACID, PLASMA: LACTIC ACID, VENOUS: 1.4 mmol/L (ref 0.5–1.9)

## 2017-12-16 LAB — MRSA PCR SCREENING: MRSA BY PCR: NEGATIVE

## 2017-12-16 LAB — APTT: APTT: 25 s (ref 24–36)

## 2017-12-16 MED ORDER — PROMETHAZINE HCL 25 MG PO TABS
12.5000 mg | ORAL_TABLET | Freq: Four times a day (QID) | ORAL | Status: DC | PRN
  Administered 2017-12-18: 12.5 mg via ORAL
  Filled 2017-12-16 (×3): qty 1

## 2017-12-16 MED ORDER — POTASSIUM PHOSPHATES 15 MMOLE/5ML IV SOLN
60.0000 mmol | Freq: Once | INTRAVENOUS | Status: AC
  Administered 2017-12-16: 60 mmol via INTRAVENOUS
  Filled 2017-12-16: qty 20

## 2017-12-16 MED ORDER — MORPHINE SULFATE (PF) 4 MG/ML IV SOLN
4.0000 mg | Freq: Once | INTRAVENOUS | Status: AC
  Administered 2017-12-16: 4 mg via INTRAVENOUS
  Filled 2017-12-16: qty 1

## 2017-12-16 MED ORDER — MAGNESIUM SULFATE 4 GM/100ML IV SOLN
4.0000 g | Freq: Once | INTRAVENOUS | Status: AC
  Administered 2017-12-17: 4 g via INTRAVENOUS
  Filled 2017-12-16: qty 100

## 2017-12-16 MED ORDER — GUAIFENESIN-DM 100-10 MG/5ML PO SYRP
10.0000 mL | ORAL_SOLUTION | ORAL | Status: DC | PRN
  Filled 2017-12-16: qty 10

## 2017-12-16 MED ORDER — MORPHINE SULFATE (PF) 2 MG/ML IV SOLN
2.0000 mg | Freq: Once | INTRAVENOUS | Status: AC
  Administered 2017-12-16: 16:00:00 2 mg via INTRAVENOUS
  Filled 2017-12-16: qty 1

## 2017-12-16 MED ORDER — POTASSIUM CHLORIDE 10 MEQ/100ML IV SOLN
10.0000 meq | INTRAVENOUS | Status: AC
  Administered 2017-12-16 (×4): 10 meq via INTRAVENOUS
  Filled 2017-12-16 (×8): qty 100

## 2017-12-16 MED ORDER — MORPHINE SULFATE (PF) 2 MG/ML IV SOLN
2.0000 mg | INTRAVENOUS | Status: DC | PRN
  Administered 2017-12-16: 19:00:00 2 mg via INTRAVENOUS
  Filled 2017-12-16: qty 1

## 2017-12-16 MED ORDER — TIZANIDINE HCL 4 MG PO TABS
4.0000 mg | ORAL_TABLET | Freq: Four times a day (QID) | ORAL | Status: DC | PRN
  Administered 2017-12-16 – 2017-12-17 (×2): 4 mg via ORAL
  Filled 2017-12-16 (×3): qty 1

## 2017-12-16 MED ORDER — MAGNESIUM SULFATE 2 GM/50ML IV SOLN
2.0000 g | Freq: Once | INTRAVENOUS | Status: AC
  Administered 2017-12-16: 2 g via INTRAVENOUS
  Filled 2017-12-16: qty 50

## 2017-12-16 MED ORDER — PROMETHAZINE HCL 25 MG/ML IJ SOLN
12.5000 mg | INTRAMUSCULAR | Status: DC | PRN
  Administered 2017-12-16: 12.5 mg via INTRAVENOUS
  Filled 2017-12-16: qty 1

## 2017-12-16 MED ORDER — PROMETHAZINE HCL 25 MG/ML IJ SOLN
INTRAMUSCULAR | Status: AC
  Filled 2017-12-16: qty 1

## 2017-12-16 MED ORDER — POTASSIUM CHLORIDE CRYS ER 20 MEQ PO TBCR
EXTENDED_RELEASE_TABLET | ORAL | Status: AC
  Filled 2017-12-16: qty 2

## 2017-12-16 MED ORDER — SPIRONOLACTONE 25 MG PO TABS
100.0000 mg | ORAL_TABLET | Freq: Two times a day (BID) | ORAL | Status: DC
  Filled 2017-12-16: qty 4

## 2017-12-16 MED ORDER — PROMETHAZINE HCL 25 MG/ML IJ SOLN
12.5000 mg | Freq: Once | INTRAMUSCULAR | Status: AC
  Administered 2017-12-16: 12.5 mg via INTRAVENOUS

## 2017-12-16 MED ORDER — PROMETHAZINE HCL 25 MG PO TABS: 12.5000 mg | ORAL_TABLET | Freq: Once | ORAL | Status: DC

## 2017-12-16 MED ORDER — ACETAMINOPHEN 650 MG RE SUPP: 650.0000 mg | Freq: Four times a day (QID) | RECTAL | Status: DC | PRN

## 2017-12-16 MED ORDER — MORPHINE SULFATE (PF) 2 MG/ML IV SOLN
2.0000 mg | Freq: Four times a day (QID) | INTRAVENOUS | Status: DC | PRN
  Administered 2017-12-16: 11:00:00 2 mg via INTRAVENOUS
  Filled 2017-12-16 (×2): qty 1

## 2017-12-16 MED ORDER — DIAZEPAM 5 MG PO TABS
ORAL_TABLET | ORAL | Status: AC
  Administered 2017-12-16: 5 mg
  Filled 2017-12-16: qty 1

## 2017-12-16 MED ORDER — SODIUM CHLORIDE 0.45 % IV SOLN
INTRAVENOUS | Status: DC
  Administered 2017-12-16: via INTRAVENOUS
  Filled 2017-12-16 (×4): qty 1000

## 2017-12-16 MED ORDER — MORPHINE SULFATE (PF) 2 MG/ML IV SOLN
2.0000 mg | Freq: Once | INTRAVENOUS | Status: AC
  Administered 2017-12-16: 13:00:00 2 mg via INTRAVENOUS

## 2017-12-16 MED ORDER — GUAIFENESIN 100 MG/5ML PO SOLN
5.0000 mL | ORAL | Status: DC | PRN
  Filled 2017-12-16: qty 5

## 2017-12-16 MED ORDER — METHYLPREDNISOLONE SODIUM SUCC 125 MG IJ SOLR
60.0000 mg | Freq: Once | INTRAMUSCULAR | Status: AC
  Administered 2017-12-16: 60 mg via INTRAVENOUS
  Filled 2017-12-16: qty 2

## 2017-12-16 MED ORDER — POTASSIUM CHLORIDE CRYS ER 20 MEQ PO TBCR
40.0000 meq | EXTENDED_RELEASE_TABLET | Freq: Once | ORAL | Status: AC
  Administered 2017-12-16: 16:00:00 40 meq via ORAL
  Filled 2017-12-16: qty 2

## 2017-12-16 MED ORDER — ACETAMINOPHEN 325 MG PO TABS
650.0000 mg | ORAL_TABLET | Freq: Four times a day (QID) | ORAL | Status: DC | PRN
  Administered 2017-12-17: 650 mg via ORAL
  Filled 2017-12-16: qty 2

## 2017-12-16 MED ORDER — SENNA 8.6 MG PO TABS
1.0000 | ORAL_TABLET | Freq: Two times a day (BID) | ORAL | Status: DC
  Administered 2017-12-16 – 2017-12-19 (×5): 8.6 mg via ORAL
  Filled 2017-12-16 (×5): qty 1

## 2017-12-16 MED ORDER — GABAPENTIN 600 MG PO TABS
600.0000 mg | ORAL_TABLET | Freq: Three times a day (TID) | ORAL | Status: DC
  Administered 2017-12-16 – 2017-12-19 (×9): 600 mg via ORAL
  Filled 2017-12-16 (×11): qty 1

## 2017-12-16 MED ORDER — NALOXONE HCL 0.4 MG/ML IJ SOLN
INTRAMUSCULAR | Status: AC
  Administered 2017-12-16: 21:00:00
  Filled 2017-12-16: qty 2

## 2017-12-16 MED ORDER — AZITHROMYCIN 250 MG PO TABS
250.0000 mg | ORAL_TABLET | Freq: Every day | ORAL | Status: AC
  Administered 2017-12-16 – 2017-12-18 (×3): 250 mg via ORAL
  Filled 2017-12-16 (×4): qty 1

## 2017-12-16 MED ORDER — POTASSIUM PHOSPHATES 15 MMOLE/5ML IV SOLN
30.0000 mmol | Freq: Once | INTRAVENOUS | Status: AC
  Administered 2017-12-16: 14:00:00 30 mmol via INTRAVENOUS
  Filled 2017-12-16: qty 10

## 2017-12-16 MED ORDER — ENOXAPARIN SODIUM 40 MG/0.4ML ~~LOC~~ SOLN
40.0000 mg | SUBCUTANEOUS | Status: DC
  Administered 2017-12-16: 40 mg via SUBCUTANEOUS
  Filled 2017-12-16 (×2): qty 0.4

## 2017-12-16 MED ORDER — LEVOTHYROXINE SODIUM 50 MCG PO TABS
75.0000 ug | ORAL_TABLET | Freq: Every day | ORAL | Status: DC
  Administered 2017-12-17 – 2017-12-19 (×3): 75 ug via ORAL
  Filled 2017-12-16 (×3): qty 2

## 2017-12-16 MED ORDER — POLYETHYLENE GLYCOL 3350 17 G PO PACK: 17.0000 g | PACK | Freq: Every day | ORAL | Status: DC | PRN

## 2017-12-16 MED ORDER — POTASSIUM CHLORIDE CRYS ER 20 MEQ PO TBCR
40.0000 meq | EXTENDED_RELEASE_TABLET | Freq: Once | ORAL | Status: AC
  Administered 2017-12-16: 40 meq via ORAL

## 2017-12-16 MED ORDER — OXYCODONE-ACETAMINOPHEN 7.5-325 MG PO TABS: 1.0000 | ORAL_TABLET | ORAL | Status: DC | PRN

## 2017-12-16 MED ORDER — OXYCODONE-ACETAMINOPHEN 7.5-325 MG PO TABS
1.0000 | ORAL_TABLET | Freq: Four times a day (QID) | ORAL | Status: DC | PRN
  Administered 2017-12-16 (×2): 1 via ORAL
  Filled 2017-12-16 (×2): qty 1

## 2017-12-16 MED ORDER — GUAIFENESIN 100 MG/5ML PO SOLN
5.0000 mL | Freq: Four times a day (QID) | ORAL | Status: DC | PRN
Start: 2017-12-16 — End: 2017-12-16
  Administered 2017-12-16: 100 mg via ORAL
  Filled 2017-12-16 (×2): qty 5

## 2017-12-16 MED ORDER — SODIUM CHLORIDE 0.45 % IV SOLN: Freq: Once | INTRAVENOUS | Status: DC

## 2017-12-16 MED ORDER — ALPRAZOLAM 0.5 MG PO TABS
0.5000 mg | ORAL_TABLET | Freq: Once | ORAL | Status: AC
  Administered 2017-12-16: 0.5 mg via ORAL
  Filled 2017-12-16: qty 1

## 2017-12-16 MED ORDER — BACLOFEN 10 MG PO TABS
20.0000 mg | ORAL_TABLET | Freq: Four times a day (QID) | ORAL | Status: DC
  Administered 2017-12-16 – 2017-12-19 (×8): 20 mg via ORAL
  Filled 2017-12-16 (×4): qty 2
  Filled 2017-12-16: qty 1
  Filled 2017-12-16 (×7): qty 2
  Filled 2017-12-16: qty 1
  Filled 2017-12-16 (×2): qty 2

## 2017-12-16 MED ORDER — POTASSIUM CHLORIDE 20 MEQ PO PACK
80.0000 meq | PACK | ORAL | Status: AC
  Administered 2017-12-16 – 2017-12-17 (×3): 80 meq via ORAL
  Filled 2017-12-16 (×3): qty 4

## 2017-12-16 MED ORDER — FUROSEMIDE 10 MG/ML IJ SOLN
20.0000 mg | Freq: Once | INTRAMUSCULAR | Status: AC
  Administered 2017-12-16: 21:00:00 20 mg via INTRAVENOUS

## 2017-12-16 MED ORDER — PANTOPRAZOLE SODIUM 40 MG PO TBEC
40.0000 mg | DELAYED_RELEASE_TABLET | Freq: Every day | ORAL | Status: DC
  Administered 2017-12-16 – 2017-12-19 (×4): 40 mg via ORAL
  Filled 2017-12-16 (×4): qty 1

## 2017-12-16 NOTE — Consult Note (Addendum)
PULMONARY / CRITICAL CARE MEDICINE   Name: Kayla Curry MRN: [id number] DOB: [birth year]    ADMISSION DATE:  12/16/2017   CONSULTATION DATE: 12/16/2017  REFERRING MD:  Dr. Duane Boston  Reason: CODE BLUE  HISTORY OF PRESENT ILLNESS: This is a 47 year old Caucasian female with a past medical history as indicated below who was admitted earlier today with complaints of bilateral lower extremity pain.  Her ED work-up showed multiple electrolyte abnormalities.  She does have a history of Gite and takes potassium and magnesium supplements at home.  She reports taking home supplements as prescribed. At about 9:00pm, a CODE BLUE was called because patient was unresponsive with agonal breaths.  She had a pulse but her respirations were suboptimal.  No chest compressions were performed.  She was given Narcan with subsequent improvement in in mental status and respirations.  She is now on nasal cannula and back to her baseline mental health status. Patient received multiple doses of morphine and hydrocodone prior to incident.  She was prescribed IV and p.o. potassium supplements but she potassium runs. She is still complaining of severe bilateral lower extremity pain which she describes as crampy and sharp, 10 on 10 in intensity.  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  PAST SURGICAL  HISTORY: She  has a past surgical history that includes Interstim Implant placement ([facility]); Cardiac electrophysiology study and ablation (2005- AT [facility]); Interstim Implant placement (10/05/2011); Interstim Implant placement (10/05/2011); Laparoscopic cholecystectomy (2001); Appendectomy (1994); Vaginal hysterectomy (2003); Tonsillectomy (1998); Interstim implant revision (N/A, 03/06/2013); Interstim Implant removal (N/A, 11/13/2016); Interstim Implant placement (N/A, 11/13/2016); Interstim Implant placement (N/A, 11/13/2016); and Esophagogastroduodenoscopy (egd) with propofol (N/A, 11/02/2017).  Allergies  Allergen Reactions  . Advair Diskus [Fluticasone-Salmeterol] Shortness Of Breath  . Albuterol Sulfate Shortness Of Breath  . Atrovent Shortness Of Breath  . Bethanechol Shortness Of Breath, Rash and Other (See Comments)    RESPIRATORY DISTRESS  . Budesonide-Formoterol Fumarate Shortness Of Breath  . Influenza Vac Split Quad Anaphylaxis  . Influenza Vaccines Anaphylaxis and Shortness Of Breath  . Ipratropium Shortness Of Breath  . Ondansetron Rash  . Shellfish Allergy Anaphylaxis and Shortness Of Breath  . Xopenex [Levalbuterol] Shortness Of Breath  . Compazine Other (See Comments)    DYSTONIC  . Fluoride Preparations   . Lorazepam Other (See Comments)    "HAS OPPOSITE EFFECT"  . Levofloxacin Rash  . Other Rash    DYSTONIC  . Potassium-Containing Compounds Nausea And Vomiting    Just the po potassium  No problem with iv  . Propofol Nausea And Vomiting and Rash  . Zofran [Ondansetron Hcl] Rash    No current facility-administered medications on file prior to encounter.    Current Outpatient Medications on File Prior to Encounter  Medication Sig  . azithromycin (ZITHROMAX) 250 MG tablet Take 2 tablets by mouth the first day then one daily for 4 days.  . baclofen (LIORESAL) 20 MG tablet TAKE 1 TABLET (20 MG TOTAL) BY MOUTH 4 (FOUR) TIMES DAILY.  Marland Kitchen  esomeprazole (NEXIUM)  40 MG capsule Take 1 capsule (40 mg total) by mouth daily before breakfast.  . gabapentin (NEURONTIN) 600 MG tablet TAKE 1 TABLET (600 MG TOTAL) BY MOUTH 3 (THREE) TIMES DAILY.  . metaproterenol (ALUPENT) 20 MG tablet Take 1 tablet (20 mg total) by mouth 3 (three) times daily as needed.  Marland Kitchen oxyCODONE-acetaminophen (PERCOCET) 7.5-325 MG tablet One tablet every 4-5 hours as needed  . Potassium 99 MG TABS Take 1 tablet by mouth 2 (two) times daily.   . potassium chloride 20 MEQ/15ML (10%) SOLN Take 7.5 mLs (10 mEq total) by mouth daily for 30 doses.  . predniSONE (DELTASONE) 5 MG tablet Take 1 tablet (5 mg total) by mouth daily with breakfast. Taper dose daily starting with 6 tablets the first day (6,5,4,3,2,1).  . promethazine (PHENERGAN) 25 MG tablet Take 1 tablet (25 mg total) by mouth every 6 (six) hours as needed for nausea.  Marland Kitchen spironolactone (ALDACTONE) 100 MG tablet TAKE 1 TABLET (100 MG TOTAL) BY MOUTH 2 (TWO) TIMES DAILY.  . SYNTHROID 75 MCG tablet TAKE 1 TABLET BY MOUTH DAILY. PLEASE SCHEDULE OFFICE VISIT FOR FOLLOWUP  . tiZANidine (ZANAFLEX) 4 MG tablet TAKE 1 TABLET (4 MG TOTAL) BY MOUTH 2 (TWO) TIMES DAILY.  Marland Kitchen EPINEPHrine (EPIPEN 2-PAK) 0.3 mg/0.3 mL IJ SOAJ injection Inject into the muscle.  . sucralfate (CARAFATE) 1 GM/10ML suspension Take 10 mLs (1 g total) by mouth 4 (four) times daily. (Patient not taking: Reported on 12/14/2017)    FAMILY HISTORY:  Her indicated that her mother is alive. She indicated that her father is alive. She indicated that both of her sisters are alive. She indicated that her brother is alive. She indicated that her daughter is alive. She indicated that her son is alive. She indicated that the status of her neg hx is unknown.   SOCIAL HISTORY: She  reports that she has never smoked. She has never used smokeless tobacco. She reports that she does not drink alcohol or use drugs.  REVIEW OF SYSTEMS:   Constitutional: Negative for fever and chills.  HENT:  Negative for congestion and rhinorrhea.  Eyes: Negative for redness and visual disturbance.  Respiratory: Negative for shortness of breath and wheezing.  Cardiovascular: Negative for chest pain and palpitations.  Gastrointestinal: Negative  for nausea , vomiting and abdominal pain and  Loose stools Genitourinary: Negative for dysuria and urgency.  Endocrine: Denies polyuria, polyphagia and heat intolerance Musculoskeletal: Positive for myalgias and arthralgias in bilateral lower extremities Skin: Negative for pallor and wound.  Neurological: Negative for dizziness and headaches   SUBJECTIVE:   VITAL SIGNS: BP (!) (P) 107/59 (BP Location: Right Arm)   Pulse (P) 76   Temp (P) 98.3 F (36.8 C) (Axillary)   Resp 18   Ht 5\' 2"  (1.575 m)   Wt 111 lb (50.3 kg)   SpO2 (P) 100%   BMI 20.30 kg/m   HEMODYNAMICS:    VENTILATOR SETTINGS:    INTAKE / OUTPUT: I/O last 3 completed shifts: In: 240 [P.O.:240] Out: -   PHYSICAL EXAMINATION: General: Appears to be in acute distress Neuro: Alert and oriented x3, cranial nerves intact HEENT: PERRLA, oral mucosa dry, no JVD Cardiovascular: Apical pulse mildly tachycardic, S1-S2, no murmurs regurg or gallop, +2 pulses bilaterally, no edema Lungs: Clear to auscultation bilaterally Abdomen: Nondistended, normal bowel sounds in all 4 quadrants Musculoskeletal: Positive range of motion in upper and lower extremities, pain with gentle palpation of bilateral lower extremities  skin: Warm  and dry  LABS:  BMET Recent Labs  Lab 12/16/17 0606 12/16/17 2209  NA 141 143  K <2.0* <2.0*  CL 106 106  CO2 29 29  BUN 11 8  CREATININE 0.62 0.58  GLUCOSE 95 106*    Electrolytes Recent Labs  Lab 12/16/17 0606 12/16/17 2209  CALCIUM 8.7* 8.1*  MG 1.5* 1.6*  PHOS <1.0*  --     CBC Recent Labs  Lab 12/16/17 0430 12/16/17 2208  WBC 11.4* 16.8*  HGB 13.1 13.3  HCT 37.8 38.1  PLT 265 279    Coag's Recent Labs  Lab 12/16/17 2208   APTT 25  INR 0.88    Sepsis Markers Recent Labs  Lab 12/16/17 2208  LATICACIDVEN 1.4    ABG No results for input(s): PHART, PCO2ART, PO2ART in the last 168 hours.  Liver Enzymes No results for input(s): AST, ALT, ALKPHOS, BILITOT, ALBUMIN in the last 168 hours.  Cardiac Enzymes No results for input(s): TROPONINI, PROBNP in the last 168 hours.  Glucose Recent Labs  Lab 12/16/17 2109  GLUCAP 83    Imaging Dg Chest Port 1 View  Result Date: 12/16/2017 CLINICAL DATA:  Shortness of breath EXAM: PORTABLE CHEST 1 VIEW COMPARISON:  04/06/2016 FINDINGS: Normal heart size and mediastinal contours. No acute infiltrate or edema. No effusion or pneumothorax. No acute osseous findings. Artifact from EKG leads. IMPRESSION: Negative portable chest Electronically Signed   By: Monte Fantasia M.D.   On: 12/16/2017 21:08    STUDIES:  None  CULTURES: None  ANTIBIOTICS: None  SIGNIFICANT EVENTS: 12/16/2017: Admitted   LINES/TUBES: peripheral IVs  DISCUSSION: 47 year old female presenting with severe lower extremity cramps secondary to severe hypokalemia, hypophosphatemia and hypomagnesemia  ASSESSMENT Status post severe respiratory distress secondary to opiate overdose Severe bilateral lower extremity cramps secondary to electrolyte abnormalities Hypokalemia Hypomagnesemia Hypophosphatemia Gitelman/Barter syndromes GERD Hypothyroidism Right adrenal adenoma Leukocytosis  Plan Will continue p.o. and IV electrolyte supplemental oxygen oxygen to maintain SPO2 greater than 90% replacement-potassium magnesium and phosphorus supplements ordered Nephrology following Continue spironolactone as ordered Hold all opiates IV fluids Resume all home medications Supplemental oxygen to maintain SPO2 greater than 90% GI and DVT prophylaxis Patient's family updated at bedside   [facility]ene S[facility] ANP-BC Pulmonary and Critical Care Medicine [facility] Pager [phone]  or [phone]  NB: This document was prepared using Dragon voice recognition software and may include unintentional dictation errors.   12/16/2017, 10:53 PM   Patient was noticed to be constantly asking for pain meds. and Benadryl, initially denied using narcotics for the last 2 month. Patient pharmacy revealed she has been on narcotics since 2017. Family (husband and daughter) admitted she has been on narcotics with possible withdrawal symptoms. Plan to optimize Narcotics and keep monitoring neuro status and hemodynamics in ICU. 12/17/2017 06:00 Pm Patient C/O that she having an allergic reaction with tongue swelling and can not swallow meds. and she requested higher dose of benadryl. No noticeable tongue swelling on exam and able handle secretion. Will change Benadryl to iv PRN and monitor airway.  12/17/2017 19:25 Follow up: no distress, able to talk and handle secretions however complaining of burning throat. Will start on Decadron and as verified she denied allergy to iv steroids

## 2017-12-16 NOTE — ED Notes (Signed)
Woke pt to give medications. Pt is more comfortable and cramping pain has decreased. Labs drawn again as lab requested to recheck the abnormally low potassium value of 1.

## 2017-12-16 NOTE — Progress Notes (Signed)
Patient stated pain level 10/10 in bilateral lower extremities and upper bilateral extremities, received a one time order of morphine 2mg  and morphine 2mg  every 4 hours as needed on after. Administered morphine 2mg .

## 2017-12-16 NOTE — ED Notes (Signed)
Family at bedside. 

## 2017-12-16 NOTE — Progress Notes (Signed)
Patient crying, yelling out in pain, stating 10/10 pain in bilateral lower extremities. Notified Dr. Marthann Schiller of patient's pain level, administered prn morphine 2mg .

## 2017-12-16 NOTE — ED Provider Notes (Signed)
Department of Emergency Medicine   Code Blue CONSULT NOTE  Chief Complaint: Cardiac arrest/unresponsive   Level V Caveat: Unresponsive  History of present illness: I was contacted by the hospital for a CODE BLUE cardiac arrest upstairs and presented to the patient's bedside.    ROS: Unable to obtain, Level V caveat  Scheduled Meds: . azithromycin  250 mg Oral Daily  . baclofen  20 mg Oral QID  . enoxaparin (LOVENOX) injection  40 mg Subcutaneous Q24H  . gabapentin  600 mg Oral TID  . [START ON 12/17/2017] levothyroxine  75 mcg Oral QAC breakfast  . naloxone      . pantoprazole  40 mg Oral Daily  . senna  1 tablet Oral BID  . spironolactone  100 mg Oral BID   Continuous Infusions: . small volume/piggyback builder     PRN Meds:.acetaminophen **OR** acetaminophen, guaiFENesin, morphine injection, oxyCODONE-acetaminophen, polyethylene glycol, promethazine, tiZANidine Past Medical History:  Diagnosis Date  . Adrenal adenoma, right   . Asthma   . Chronic pain syndrome   . GERD (gastroesophageal reflux disease)   . Gitelman syndrome    DISORDER MAGNESIUM METABOLISM  . History of pelvic fracture   . History of supraventricular tachycardia    PER PT ON 02-28-2013 NO ISSUES SINCE ABLATION IN 2005  . Hypokalemia    CHRONIC  . Hypothyroidism   . 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 Magnesium metabolism disorder NEPHROLOGIST-  DR [name] ([facility] IN [place])   [place]  . Malfunction of device    INTERSTIM  . Neurogenic bladder   . PONV (postoperative nausea and vomiting)    reports PONV and rash with propofol  . S/P ablation of ventricular arrhythmia HX SVT--  2005   [place]--  LAST VISIT 2012 -- PT RELEASED FROM CARE ON PRN BASIS  . Self-catheterizes urinary bladder   . Urinary retention    RESIDUAL FROM PELVIC FX INJURY---Self cath TID and prn  . Wears contact  lenses    Past Surgical History:  Procedure Laterality Date  . APPENDECTOMY  1994   EXPL. LAP.  Marland Kitchen CARDIAC ELECTROPHYSIOLOGY STUDY AND ABLATION  2005- AT [facility]   SVT--  PT STATES NO ISSUES SINCE   . ESOPHAGOGASTRODUODENOSCOPY (EGD) WITH PROPOFOL N/A 11/02/2017   Procedure: ESOPHAGOGASTRODUODENOSCOPY (EGD) WITH PROPOFOL;  Surgeon: Lucilla Lame, MD;  Location: [place];  Service: Endoscopy;  Laterality: N/A;  . INTERSTIM IMPLANT PLACEMENT  2009[facility]  . INTERSTIM IMPLANT PLACEMENT  10/05/2011   Procedure: Barrie Lyme IMPLANT FIRST STAGE;  Surgeon: [name], MD;  Location: [facility];  Service: Urology;  Laterality: N/A;  . INTERSTIM IMPLANT PLACEMENT  10/05/2011   Procedure: Barrie Lyme IMPLANT SECOND STAGE;  Surgeon: [name], MD;  Location: [facility];  Service: Urology;  Laterality: N/A;  . INTERSTIM IMPLANT PLACEMENT N/A 11/13/2016   Procedure: Barrie Lyme IMPLANT FIRST STAGE;  Surgeon: [name], MD;  Location: [facility];  Service: Urology;  Laterality: N/A;  . INTERSTIM IMPLANT PLACEMENT N/A 11/13/2016   Procedure: Barrie Lyme IMPLANT SECOND STAGE;  Surgeon: [name], MD;  Location: [facility];  Service: Urology;  Laterality: N/A;  . INTERSTIM IMPLANT REMOVAL N/A 11/13/2016   Procedure: REMOVAL OF INTERSTIM IMPLANT ONE AND TWO;  Surgeon: [name], MD;  Location: [facility];  Service: Urology;  Laterality: N/A;  .  INTERSTIM IMPLANT REVISION N/A 03/06/2013   Procedure: REVISION OF Barrie Lyme;  Surgeon: [name], MD;  Location: [facility];  Service: Urology;  Laterality: N/A;  . LAPAROSCOPIC CHOLECYSTECTOMY  2001  . TONSILLECTOMY  1998  . VAGINAL HYSTERECTOMY  2003   Social History   Socioeconomic History  . Marital status: Married    Spouse name: Not on file  . Number of children: 2  . Years of education: Not on file  . Highest  education level: Not on file  Occupational History  . 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 Financial resource strain: Not on file  . Food insecurity:    Worry: Not on file    Inability: Not on file  . Transportation needs:    Medical: Not on file    Non-medical: Not on file  Tobacco Use  . Smoking status: Never Smoker  . Smokeless tobacco: Never Used  Substance and Sexual Activity  . Alcohol use: No  . Drug use: No  . Sexual activity: Not on file  Lifestyle  . Physical activity:    Days per week: Not on file    Minutes per session: Not on file  . Stress: Not on file  Relationships  . Social connections:    Talks on phone: Not on file    Gets together: Not on file    Attends religious service: Not on file    Active member of club or organization: Not on file    Attends meetings of clubs or organizations: Not on file    Relationship status: Not on file  . Intimate partner violence:    Fear of current or ex partner: Not on file    Emotionally abused: Not on file    Physically abused: Not on file    Forced sexual activity: Not on file  Other Topics Concern  . Not on file  Social History Narrative  . Not on file   Allergies  Allergen Reactions  . Advair Diskus [Fluticasone-Salmeterol] Shortness Of Breath  . Albuterol Sulfate Shortness Of Breath  . Atrovent Shortness Of Breath  . Bethanechol Shortness Of Breath, Rash and Other (See Comments)    RESPIRATORY DISTRESS  . Budesonide-Formoterol Fumarate Shortness Of Breath  . Influenza Vac Split Quad Anaphylaxis  . Influenza Vaccines Anaphylaxis and Shortness Of Breath  . Ipratropium Shortness Of Breath  . Ondansetron Rash  . Shellfish Allergy Anaphylaxis and Shortness Of Breath  . Xopenex [Levalbuterol] Shortness Of Breath  . Compazine Other (See Comments)    DYSTONIC  . Fluoride Preparations   . Lorazepam Other (See Comments)    "HAS OPPOSITE EFFECT"   . Levofloxacin Rash  . Other Rash    DYSTONIC  . Potassium-Containing Compounds Nausea And Vomiting    Just the po potassium  No problem with iv  . Propofol Nausea And Vomiting and Rash  . Zofran [Ondansetron Hcl] Rash    Last set of Vital Signs (not current) Vitals:   12/16/17 1753 12/16/17 1936  BP: (!) 106/56 115/67  Pulse: 69 78  Resp:  18  Temp: 98.3 F (36.8 C) 98 F (36.7 C)  SpO2: 99% 100%      Physical Exam  Gen: unresponsive Cardiovascular: Positive carotid pulse Resp: Severe bradycardia apnea.  Shallow slow respirations. Pupils are pinpoint bilateral Abd: nondistended  Neuro: GCS 3, unresponsive to pain  HEENT: Has intact gag reflex Neck: No  crepitus  Musculoskeletal: No deformity  Skin: Slightly cool to touch  CRITICAL CARE Performed by: Delman Kitten Total critical care time: 20 Critical care time was exclusive of separately billable procedures and treating other patients. Critical care was necessary to treat or prevent imminent or life-threatening deterioration. Critical care was time spent personally by me on the following activities: development of treatment plan with patient and/or surrogate as well as nursing, discussions with consultants, evaluation of patient's response to treatment, examination of patient, obtaining history from patient or surrogate, ordering and performing treatments and interventions, ordering and review of laboratory studies, ordering and review of radiographic studies, pulse oximetry and re-evaluation of patient's condition.  Medical Decision making  Patient in peri-arrest state on arrival.  She is unresponsive.  Nursing reports receiving multiple doses of morphine.  Has been given 1 mg Narcan with no response.  On initial arrival the patient did not have any supplemental oxygen on.  She was quickly changed over to a bag valve masking which I took over and continue direct code care.  Patient does have a positive pulse on arrival.   Normal sinus rhythm on telemetry.  Pinpoint pupils, I have ordered additional Narcan.  Continue to bag the patient, SPO2 100% with bag-valve-mask ventilation.  Patient tolerating ventilation well.  Assessment and Plan  After receiving second dose of naloxone, patient respirations improved rapidly.  Patient able to open her eyes and give her name.  He does have rhonchorous breath sounds bilateral, but these are evidently reported previous to this area rest situation.  She has breast pretty well now, strong pulse, much improved mental status.  Normal oximetry on nonrebreather mask.  Patient care turned over to hospitalist Dr. Anselm Jungling at the bedside.    Delman Kitten, MD 12/16/17 2127

## 2017-12-16 NOTE — ED Notes (Signed)
Pt given mustard and water to aid with cramp.

## 2017-12-16 NOTE — Plan of Care (Signed)
  Problem: Education: Goal: Knowledge of General Education information will improve Outcome: Progressing   Problem: Clinical Measurements: Goal: Ability to maintain clinical measurements within normal limits will improve Outcome: Progressing Goal: Will remain free from infection Outcome: Progressing Goal: Diagnostic test results will improve Outcome: Progressing Goal: Respiratory complications will improve Outcome: Progressing Goal: Cardiovascular complication will be avoided Outcome: Progressing   Problem: Activity: Goal: Risk for activity intolerance will decrease Outcome: Progressing   Problem: Nutrition: Goal: Adequate nutrition will be maintained Outcome: Progressing   Problem: Coping: Goal: Level of anxiety will decrease Outcome: Progressing   Problem: Elimination: Goal: Will not experience complications related to bowel motility Outcome: Progressing Goal: Will not experience complications related to urinary retention Outcome: Progressing   Problem: Pain Managment: Goal: General experience of comfort will improve Outcome: Progressing   Problem: Safety: Goal: Ability to remain free from injury will improve Outcome: Progressing   Problem: Skin Integrity: Goal: Risk for impaired skin integrity will decrease Outcome: Progressing   

## 2017-12-16 NOTE — Progress Notes (Signed)
Contacted hospitalist about patient's pain being unmanaged under current prescribed parameters. See new order.

## 2017-12-16 NOTE — Progress Notes (Signed)
Patient stating 10/10 pain to bilateral lower extremities. Administered prn percocet and zanaflex given at 0925, patient stated she still had 10/10 pain, patient crying, grimacing. Discussed with Dr. Posey Pronto, morphine 2mg  order received prn every 6 hours, administered prn morphine 2mg  at 1030.

## 2017-12-16 NOTE — Progress Notes (Signed)
ABG attempted. Pt began screaming and demanded that I stop.

## 2017-12-16 NOTE — ED Notes (Signed)
Pt unable to bear weight to her legs. Pt reports pain to the top of her thighs bilaterally. Pt has a hx of cramps.

## 2017-12-16 NOTE — Progress Notes (Signed)
RCP responded to code blue. Pt found to be unresponsive with apnea/agonal respirations. Bag/mask ventilation was initiated. Pt subsequently resumed consciousness and spontaneous respirations, at which point PPV was discontinued and pt was placed on nrb mask with 100% O2 sat. She is awaiting transfer to CCU.

## 2017-12-16 NOTE — H&P (Signed)
[facility] NAME: Kayla Curry    MR#:  [id number]  DATE OF BIRTH:  [birth year]  DATE OF ADMISSION:  12/16/2017  PRIMARY CARE PHYSICIAN: [name], MD   REQUESTING/REFERRING PHYSICIAN: Dr Dahlia Client  CHIEF COMPLAINT:  bilateral lower extremity leg pain and cramping  HISTORY OF PRESENT ILLNESS:  Kayla Curry  is a 47 y.o. female with a known history of hypokalemia, history of Gittleman/Bartter's syndrom, hypothyroidism, Kayla Curry, benign right adrenal adenoma comes to the emergency room with increasing bilateral lower extremity pain and cramping. Patient was found to have hypokalemia, hypomagnesimia, low phosphorus. She is being admitted for further evaluation and management.  PAST MEDICAL HISTORY:   Past Medical History:  Diagnosis Date  . Adrenal adenoma, right   . Asthma   . Chronic pain syndrome   . GERD (gastroesophageal reflux disease)   . Gitelman syndrome    DISORDER MAGNESIUM METABOLISM  . History of pelvic fracture   . History of supraventricular tachycardia    PER PT ON 02-28-2013 NO ISSUES SINCE ABLATION IN 2005  . Hypokalemia    CHRONIC  . Hypothyroidism   . 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 Magnesium metabolism disorder NEPHROLOGIST-  DR [name] ([facility] [place])   [place]  . Malfunction of device    INTERSTIM  . Neurogenic bladder   . PONV (postoperative nausea and vomiting)    reports PONV and rash with propofol  . S/P ablation of ventricular arrhythmia HX SVT--  2005   [place]--  LAST VISIT 2012 -- PT RELEASED FROM CARE ON PRN BASIS  . Self-catheterizes urinary bladder   . Urinary retention    RESIDUAL FROM PELVIC FX INJURY---Self cath TID and prn  . Wears contact lenses     PAST SURGICAL HISTOIRY:   Past Surgical History:  Procedure Laterality Date  . APPENDECTOMY  1994    EXPL. LAP.  Marland Kitchen CARDIAC ELECTROPHYSIOLOGY STUDY AND ABLATION  2005- AT [facility]   SVT--  PT STATES NO ISSUES SINCE   . ESOPHAGOGASTRODUODENOSCOPY (EGD) WITH PROPOFOL N/A 11/02/2017   Procedure: ESOPHAGOGASTRODUODENOSCOPY (EGD) WITH PROPOFOL;  Surgeon: Lucilla Lame, MD;  Location: [place];  Service: Endoscopy;  Laterality: N/A;  . INTERSTIM IMPLANT PLACEMENT  2009[facility]  . INTERSTIM IMPLANT PLACEMENT  10/05/2011   Procedure: Barrie Lyme IMPLANT FIRST STAGE;  Surgeon: [name], MD;  Location: [facility];  Service: Urology;  Laterality: N/A;  . INTERSTIM IMPLANT PLACEMENT  10/05/2011   Procedure: Barrie Lyme IMPLANT SECOND STAGE;  Surgeon: [name], MD;  Location: [facility];  Service: Urology;  Laterality: N/A;  . INTERSTIM IMPLANT PLACEMENT N/A 11/13/2016   Procedure: Barrie Lyme IMPLANT FIRST STAGE;  Surgeon: [name], MD;  Location: [facility];  Service: Urology;  Laterality: N/A;  . INTERSTIM IMPLANT PLACEMENT N/A 11/13/2016   Procedure: Barrie Lyme IMPLANT SECOND STAGE;  Surgeon: [name], MD;  Location: [facility];  Service: Urology;  Laterality: N/A;  . INTERSTIM IMPLANT REMOVAL N/A 11/13/2016   Procedure: REMOVAL OF INTERSTIM IMPLANT ONE AND TWO;  Surgeon: [name], MD;  Location: [facility];  Service: Urology;  Laterality: N/A;  . INTERSTIM IMPLANT REVISION N/A 03/06/2013   Procedure: REVISION OF Barrie Lyme;  Surgeon: [name], MD;  Location: [facility];  Service: Urology;  Laterality: N/A;  . LAPAROSCOPIC CHOLECYSTECTOMY  2001  . TONSILLECTOMY  1998  . VAGINAL HYSTERECTOMY  2003    SOCIAL HISTORY:   Social History   Tobacco Use  . Smoking status: Never Smoker  . Smokeless tobacco: Never Used  Substance Use Topics  . Alcohol use: No    FAMILY HISTORY:   Family History  Problem Relation Age of Onset  . Hypertension Father    . Colon cancer Neg Hx     DRUG ALLERGIES:   Allergies  Allergen Reactions  . Advair Diskus [Fluticasone-Salmeterol] Shortness Of Breath  . Albuterol Sulfate Shortness Of Breath  . Atrovent Shortness Of Breath  . Bethanechol Shortness Of Breath, Rash and Other (See Comments)    RESPIRATORY DISTRESS  . Budesonide-Formoterol Fumarate Shortness Of Breath  . Influenza Vac Split Quad Anaphylaxis  . Influenza Vaccines Anaphylaxis and Shortness Of Breath  . Ipratropium Shortness Of Breath  . Ondansetron Rash  . Shellfish Allergy Anaphylaxis and Shortness Of Breath  . Xopenex [Levalbuterol] Shortness Of Breath  . Compazine Other (See Comments)    DYSTONIC  . Fluoride Preparations   . Lorazepam Other (See Comments)    "HAS OPPOSITE EFFECT"  . Levofloxacin Rash  . Other Rash    DYSTONIC  . Potassium-Containing Compounds Nausea And Vomiting    Just the po potassium  No problem with iv  . Propofol Nausea And Vomiting and Rash  . Zofran [Ondansetron Hcl] Rash    REVIEW OF SYSTEMS:  Review of Systems  Constitutional: Negative for chills, fever and weight loss.  HENT: Negative for ear discharge, ear pain and nosebleeds.   Eyes: Negative for blurred vision, pain and discharge.  Respiratory: Negative for sputum production, shortness of breath, wheezing and stridor.   Cardiovascular: Negative for chest pain, palpitations, orthopnea and PND.  Gastrointestinal: Negative for abdominal pain, diarrhea, nausea and vomiting.  Genitourinary: Negative for frequency and urgency.  Musculoskeletal: Negative for back pain and joint pain.       Bilateral lower extremity cramping  Neurological: Negative for sensory change, speech change, focal weakness and weakness.  Psychiatric/Behavioral: Negative for depression and hallucinations. The patient is not nervous/anxious.      MEDICATIONS AT HOME:   Prior to Admission medications   Medication Sig Start Date End Date Taking? Authorizing Provider   azithromycin (ZITHROMAX) 250 MG tablet Take 2 tablets by mouth the first day then one daily for 4 days. 12/14/17  Yes Chrismon, Vickki Muff, PA  baclofen (LIORESAL) 20 MG tablet TAKE 1 TABLET (20 MG TOTAL) BY MOUTH 4 (FOUR) TIMES DAILY. 12/02/17  Yes [name], MD  esomeprazole (NEXIUM) 40 MG capsule Take 1 capsule (40 mg total) by mouth daily before breakfast. 06/04/12  Yes Ladene Artist, MD  gabapentin (NEURONTIN) 600 MG tablet TAKE 1 TABLET (600 MG TOTAL) BY MOUTH 3 (THREE) TIMES DAILY. 11/01/17  Yes [name], MD  metaproterenol (ALUPENT) 20 MG tablet Take 1 tablet (20 mg total) by mouth 3 (three) times daily as needed. 02/14/16  Yes [name], MD  oxyCODONE-acetaminophen (PERCOCET) 7.5-325 MG tablet One tablet every 4-5 hours as needed 10/15/17  Yes [name], MD  Potassium 99 MG TABS Take 1 tablet by mouth 2 (two) times daily.    Yes [provider]  potassium chloride 20 MEQ/15ML (10%) SOLN Take 7.5 mLs (10 mEq total) by mouth daily for 30 doses. 12/13/17 01/12/18 Yes Lucilla Lame, MD  predniSONE (DELTASONE) 5 MG tablet Take 1  tablet (5 mg total) by mouth daily with breakfast. Taper dose daily starting with 6 tablets the first day (6,5,4,3,2,1). 12/14/17  Yes Chrismon, Vickki Muff, PA  promethazine (PHENERGAN) 25 MG tablet Take 1 tablet (25 mg total) by mouth every 6 (six) hours as needed for nausea. 10/06/17  Yes [name], MD  spironolactone (ALDACTONE) 100 MG tablet TAKE 1 TABLET (100 MG TOTAL) BY MOUTH 2 (TWO) TIMES DAILY. 10/11/16  Yes [name], MD  SYNTHROID 75 MCG tablet TAKE 1 TABLET BY MOUTH DAILY. PLEASE SCHEDULE OFFICE VISIT FOR FOLLOWUP 06/19/17  Yes [name], MD  tiZANidine (ZANAFLEX) 4 MG tablet TAKE 1 TABLET (4 MG TOTAL) BY MOUTH 2 (TWO) TIMES DAILY. 11/01/17  Yes [name], MD  EPINEPHrine (EPIPEN 2-PAK) 0.3 mg/0.3 mL IJ SOAJ injection Inject into the muscle. 08/04/10   [provider]  sucralfate (CARAFATE) 1  GM/10ML suspension Take 10 mLs (1 g total) by mouth 4 (four) times daily. Patient not taking: Reported on 12/14/2017 11/29/17   Lucilla Lame, MD      VITAL SIGNS:  Blood pressure 120/66, pulse 72, temperature 98.5 F (36.9 C), temperature source Oral, resp. rate 16, height 5\' 2"  (1.575 m), weight 50.3 kg (111 lb), SpO2 100 %.  PHYSICAL EXAMINATION:  GENERAL:  47 y.o.-year-old patient lying in the bed with no acute distress. EYES: Pupils equal, round, reactive to light and accommodation. No scleral icterus. Extraocular muscles intact.  HEENT: Head atraumatic, normocephalic. Oropharynx and nasopharynx clear.  NECK:  Supple, no jugular venous distention. No thyroid enlargement, no tenderness.  LUNGS: Normal breath sounds bilaterally, no wheezing, rales,rhonchi or crepitation. No use of accessory muscles of respiration.  CARDIOVASCULAR: S1, S2 normal. No murmurs, rubs, or gallops.  ABDOMEN: Soft, nontender, nondistended. Bowel sounds present. No organomegaly or mass.  EXTREMITIES: No pedal edema, cyanosis, or clubbing.  NEUROLOGIC: Cranial nerves II through XII are intact. Muscle strength 5/5 in all extremities. Sensation intact. Gait not checked.  PSYCHIATRIC: The patient is alert and oriented x 3.  SKIN: No obvious rash, lesion, or ulcer.   LABORATORY PANEL:   CBC Recent Labs  Lab 12/16/17 0430  WBC 11.4*  HGB 13.1  HCT 37.8  PLT 265   ------------------------------------------------------------------------------------------------------------------  Chemistries  Recent Labs  Lab 12/16/17 0606  NA 141  K <2.0*  CL 106  CO2 29  GLUCOSE 95  BUN 11  CREATININE 0.62  CALCIUM 8.7*  MG 1.5*   ------------------------------------------------------------------------------------------------------------------  Cardiac Enzymes No results for input(s): TROPONINI in the last 168  hours. ------------------------------------------------------------------------------------------------------------------  RADIOLOGY:  No results found.  EKG:    IMPRESSION AND PLAN:   Molli Gethers  is a 47 y.o. female with a known history of hypokalemia, history of Gittleman/Bartter's syndrom, hypothyroidism, Kayla Curry, benign right adrenal adenoma comes to the emergency room with increasing bilateral lower extremity pain and cramping.  1. bilateral lower extremity cramping secondary to severe electrolyte abnormality -patient has low phosphorus, magnesium, potassium -pharmacy consult for electrolyte replacement -nephrology consultation -during pain meds -continue spironolactone  2. Gerd/chronic active gastritis -continue PPI and Carafate -in upper G.I. endoscopy in March 2019  3. Hypothyroidism continue Synthroid  4. Right adrenal adenoma known history. Patient has had extensive workup done at [place] with patient and husband    All the records are reviewed and case discussed with ED provider. Management plans discussed with the patient, family and they are in agreement.  CODE STATUS: full  TOTAL TIME TAKING CARE OF  THIS PATIENT: *45* minutes.    Fritzi Mandes M.D on 12/16/2017 at 1:20 PM  Between 7am to 6pm - Pager - [phone]  After 6pm go to [email or web address] - password [email or web address] [facility]  [facility]  Office  [phone]  CC: Primary care physician; [name], MD

## 2017-12-16 NOTE — Progress Notes (Signed)
Paged Dr. Posey Pronto of patient's request for pain medication 10/10 pain. Administered percocet. Patient crying, grimacing yelling out in pain in upper and lower bilateral extremities.

## 2017-12-16 NOTE — Progress Notes (Signed)
The patient had continued complain of pain, 10 out of 10, constant crying due to that and appearing anxious since 6 PM in evening ( when I started answering my pager) , I was called multiple times by the floor nurse for these issues. I did not increase her pain medications,had ordered 1 dose of Xanax and chest x-ray as she had complain of cough and shortness of breath with anxiety and crying.  I was called in for her urinary retention off more than 1000 mL, and I have advised to place a Foley catheter.  Later around 8:30 PM, CODE BLUE was activated as patient was unresponsive. ER physician Dr.Quale had responded to the code, the patient was suspected to have overdose of her pain medications. She responded to 2 mg of Narcan injections, vitals were stable after Narcan.  Physical exam  Patient is alert but crying again due to severe pain and very anxious. Respiratory rate appears slightly faster, no active wheezing. CVS- s1s2 normal, regular.  Assessment and plan  * acute respiratory failure   Altered mental status   Opioid overdose   Likely anxiety disorder   Urinary retention   Hypokalemia   Hypomagnesemia    Responded currently to Narcan injection.   Continue nonrebreather mask and transferred to stepdown unit for further management.   I spoke to e link ICU physician and informed him about the patient.   Currently hold pain medications.   Will check BMP and ABG for now.   Chest x-ray earlier did not show any acute findings.   Foley catheter for urinary retention.   Psych consult.    I spoke to patient's husband and informed him about the situation and transferring to ICU because of her critical condition for close monitoring.   He understand and agree with the plan.    Critical care time spent 50 minutes.

## 2017-12-16 NOTE — Progress Notes (Signed)
Patient verbalized 5/10 pain.

## 2017-12-16 NOTE — Progress Notes (Signed)
Patient stated pain level 5/10.

## 2017-12-16 NOTE — Progress Notes (Signed)
Patient stated pain level is a 4/10.

## 2017-12-16 NOTE — ED Triage Notes (Signed)
EMS pt to Rm 19 from home with report of hx of implanted neuro stimulator in her back to help with urinary retention. Tonight pt got up to go to the bathroom and had severe pain across the front of both er legs and was unable to bear wt.

## 2017-12-16 NOTE — Consult Note (Addendum)
PHARMACY CONSULT NOTE - INITIAL   Pharmacy Consult for Electrolyte Monitoring and Replacement   Labs: Recent Labs    12/16/17 0430 12/16/17 0606  WBC 11.4*  --   HGB 13.1  --   HCT 37.8  --   PLT 265  --   CREATININE  --  0.62  MG  --  1.5*  PHOS  --  <1.0*   Potassium (mmol/L)  Date Value  12/16/2017 <2.0 (LL)  01/31/2013 4.1   Magnesium (mg/dL)  Date Value  12/16/2017 1.5 (L)   Calcium (mg/dL)  Date Value  12/16/2017 8.7 (L)   Calcium, Total (mg/dL)  Date Value  01/31/2013 8.8   Albumin (g/dL)  Date Value  06/14/2016 4.8  01/31/2013 4.1   Phosphorus (mg/dL)  Date Value  12/16/2017 <1.0 (LL)  ] Estimated Creatinine Clearance: 70.2 mL/min (by C-G formula based on SCr of 0.62 mg/dL).   Assessment: Pharmacy consulted for electrolyte monitoring and replacement in 47 yo female with hypokalemia, hypophosphatemia, and hypomagnesemia.   Goal of Therapy:  Electrolytes WNL  Plan:  4/28 AM: K: <2.0, Mg: 1.5, Phos <1.0  KCL 16mEq x 6 IV and KCL 72mEq x 1 ordered. Patient has already received Magnesium 2g IV x 1 dose.  Will order KPhos 29mmol IV x 1 dose.  Will recheck electrolytes at 1800 and continue to replace as needed per consult.   4/28 16:00 spoke with Dr. Posey Pronto and Colletta Maryland RN - patient is complaining of significant pain and burning that MD and RN attribute to potassium infusion. Will give potassium chloride 40 mEq po x 1, hold potassium chloride 10 mEq IV for now, continue and finish potassium phosphate, resume remaining doses of KCl IV when KPhos finished, recheck electrolytes this evening at 22:00. May put KCl in 500 mL bag when KPhos completed if patient tolerates. - NAC  Pernell Dupre, PharmD, BCPS Clinical Pharmacist 12/16/2017 10:02 AM

## 2017-12-16 NOTE — Consult Note (Signed)
[facility]  CONSULT NOTE    Date: 12/16/2017                  Patient Name:  Kayla Curry  MRN: [id number]  DOB: Jun 04, 1971  Age / Sex: 47 y.o., female         PCP: [name], MD                 Service Requesting Consult: Dr. Fritzi Mandes                 Reason for Consult: Hypokalemia            History of Present Illness: Kayla Curry is a 47 y.o.  female with Gittelman's/Barters, hypothyroidism, GERD, asthma, right adrenal adenoma, hysterectomy, appendectomy, cholecystectomy, who was admitted to [facility] on 12/16/2017 for Hypokalemia [E87.6] Hypomagnesemia [E83.42] Hypokalemic periodic paralysis [G72.3] Hypophosphatemia [E83.39] Pain in both lower extremities [M79.604, M79.605]  Patient was admitted with leg pain/weakness and inability to urinate. She has a spinal cord stimulator which helps with bladder control. Found to have a Potassium of 1. Started on IV and PO potassium. Admitted to [facility].   Patient was seen by PCP on 4/26 for upper respiratory infection. Started on prednisone and azithromycin.   Patient was not taking her potassium chloride and was taking OTC potassium supplements due to gastric erosions on endoscopy. Told to no longer take NSAIDs, which help with Gitelman/Barter.   Follows with [facility], Dr. 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 Currently on spironolactone 100mg  bid. Potassium on 05/2017 was 3.1.    Medications: Outpatient medications: Medications Prior to Admission  Medication Sig Dispense Refill Last Dose  . azithromycin (ZITHROMAX) 250 MG tablet Take 2 tablets by mouth the first day then one daily for 4 days. 6 tablet 0 12/15/2017 at Unknown time  . baclofen (LIORESAL) 20 MG tablet TAKE 1 TABLET (20 MG TOTAL) BY MOUTH 4 (FOUR) TIMES DAILY. 120 tablet 5 12/15/2017 at Unknown time  . esomeprazole (NEXIUM) 40 MG capsule Take 1 capsule (40 mg total) by mouth daily before breakfast.  30 capsule 11 12/15/2017 at Unknown time  . gabapentin (NEURONTIN) 600 MG tablet TAKE 1 TABLET (600 MG TOTAL) BY MOUTH 3 (THREE) TIMES DAILY. 90 tablet 1 12/15/2017 at Unknown time  . metaproterenol (ALUPENT) 20 MG tablet Take 1 tablet (20 mg total) by mouth 3 (three) times daily as needed. 60 tablet 3 prn  . oxyCODONE-acetaminophen (PERCOCET) 7.5-325 MG tablet One tablet every 4-5 hours as needed 120 tablet 0 prn  . Potassium 99 MG TABS Take 1 tablet by mouth 2 (two) times daily.    12/15/2017 at Unknown time  . potassium chloride 20 MEQ/15ML (10%) SOLN Take 7.5 mLs (10 mEq total) by mouth daily for 30 doses. 225 mL 0 12/15/2017 at Unknown time  . predniSONE (DELTASONE) 5 MG tablet Take 1 tablet (5 mg total) by mouth daily with breakfast. Taper dose daily starting with 6 tablets the first day (6,5,4,3,2,1). 21 tablet 0 12/15/2017 at Unknown time  . promethazine (PHENERGAN) 25 MG tablet Take 1 tablet (25 mg total) by mouth every 6 (six) hours as needed for nausea. 60 tablet 5 prn  . spironolactone (ALDACTONE) 100 MG tablet TAKE 1 TABLET (100 MG TOTAL) BY MOUTH 2 (TWO) TIMES DAILY. 60 tablet 5 12/15/2017 at Unknown time  . SYNTHROID 75 MCG tablet TAKE 1 TABLET BY MOUTH DAILY. PLEASE SCHEDULE OFFICE VISIT FOR FOLLOWUP 30  tablet 11 12/15/2017 at Unknown time  . tiZANidine (ZANAFLEX) 4 MG tablet TAKE 1 TABLET (4 MG TOTAL) BY MOUTH 2 (TWO) TIMES DAILY. 60 tablet 4 12/15/2017 at Unknown time  . EPINEPHrine (EPIPEN 2-PAK) 0.3 mg/0.3 mL IJ SOAJ injection Inject into the muscle.   Taking  . sucralfate (CARAFATE) 1 GM/10ML suspension Take 10 mLs (1 g total) by mouth 4 (four) times daily. (Patient not taking: Reported on 12/14/2017) 420 mL 2 Not Taking at Unknown time    Current medications: Current Facility-Administered Medications  Medication Dose Route Frequency Provider Last Rate Last Dose  . acetaminophen (TYLENOL) tablet 650 mg  650 mg Oral Q6H PRN Fritzi Mandes, MD       Or  . acetaminophen (TYLENOL)  suppository 650 mg  650 mg Rectal Q6H PRN Fritzi Mandes, MD      . azithromycin (ZITHROMAX) tablet 250 mg  250 mg Oral Daily Fritzi Mandes, MD      . baclofen (LIORESAL) tablet 20 mg  20 mg Oral QID Fritzi Mandes, MD      . enoxaparin (LOVENOX) injection 40 mg  40 mg Subcutaneous Q24H Fritzi Mandes, MD      . gabapentin (NEURONTIN) tablet 600 mg  600 mg Oral TID Fritzi Mandes, MD      . Derrill Memo ON 12/17/2017] levothyroxine (SYNTHROID, LEVOTHROID) tablet 75 mcg  75 mcg Oral QAC breakfast Fritzi Mandes, MD      . morphine 2 MG/ML injection 2 mg  2 mg Intravenous Q6H PRN Fritzi Mandes, MD   2 mg at 12/16/17 1030  . oxyCODONE-acetaminophen (PERCOCET) 7.5-325 MG per tablet 1 tablet  1 tablet Oral Q6H PRN Fritzi Mandes, MD   1 tablet at 12/16/17 0925  . pantoprazole (PROTONIX) EC tablet 40 mg  40 mg Oral Daily Fritzi Mandes, MD      . polyethylene glycol (MIRALAX / GLYCOLAX) packet 17 g  17 g Oral Daily PRN Fritzi Mandes, MD      . potassium chloride 10 mEq in 100 mL IVPB  10 mEq Intravenous Q1 Hr x 6 Loney Hering, MD 100 mL/hr at 12/16/17 1036 10 mEq at 12/16/17 1036  . potassium PHOSPHATE 30 mmol in dextrose 5 % 500 mL infusion  30 mmol Intravenous Once Hallaji, Sheema M, RPH      . promethazine (PHENERGAN) tablet 12.5 mg  12.5 mg Oral Q6H PRN Fritzi Mandes, MD      . senna (SENOKOT) tablet 8.6 mg  1 tablet Oral BID Fritzi Mandes, MD      . spironolactone (ALDACTONE) tablet 100 mg  100 mg Oral BID Fritzi Mandes, MD      . tiZANidine (ZANAFLEX) tablet 4 mg  4 mg Oral Q6H PRN Fritzi Mandes, MD   4 mg at 12/16/17 1006      Allergies: Allergies  Allergen Reactions  . Advair Diskus [Fluticasone-Salmeterol] Shortness Of Breath  . Albuterol Sulfate Shortness Of Breath  . Atrovent Shortness Of Breath  . Bethanechol Shortness Of Breath, Rash and Other (See Comments)    RESPIRATORY DISTRESS  . Budesonide-Formoterol Fumarate Shortness Of Breath  . Influenza Vac Split Quad Anaphylaxis  . Influenza Vaccines Anaphylaxis and  Shortness Of Breath  . Ipratropium Shortness Of Breath  . Ondansetron Rash  . Shellfish Allergy Anaphylaxis and Shortness Of Breath  . Xopenex [Levalbuterol] Shortness Of Breath  . Compazine Other (See Comments)    DYSTONIC  . Fluoride Preparations   . Lorazepam Other (See Comments)    "HAS OPPOSITE  EFFECT"  . Levofloxacin Rash  . Other Rash    DYSTONIC  . Potassium-Containing Compounds Nausea And Vomiting    Just the po potassium  No problem with iv  . Propofol Nausea And Vomiting and Rash  . Zofran [Ondansetron Hcl] Rash      Past Medical History: Past Medical History:  Diagnosis Date  . Adrenal adenoma, right   . Asthma   . Chronic pain syndrome   . GERD (gastroesophageal reflux disease)   . Gitelman syndrome    DISORDER MAGNESIUM METABOLISM  . History of pelvic fracture   . History of supraventricular tachycardia    PER PT ON 02-28-2013 NO ISSUES SINCE ABLATION IN 2005  . Hypokalemia    CHRONIC  . Hypothyroidism   . 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 Magnesium metabolism disorder NEPHROLOGIST-  DR [name] ([facility] [place])   [place]  . Malfunction of device    INTERSTIM  . Neurogenic bladder   . PONV (postoperative nausea and vomiting)    reports PONV and rash with propofol  . S/P ablation of ventricular arrhythmia HX SVT--  2005   [place]--  LAST VISIT 2012 -- PT RELEASED FROM CARE ON PRN BASIS  . Self-catheterizes urinary bladder   . Urinary retention    RESIDUAL FROM PELVIC FX INJURY---Self cath TID and prn  . Wears contact lenses      Past Surgical History: Past Surgical History:  Procedure Laterality Date  . APPENDECTOMY  1994   EXPL. LAP.  Marland Kitchen CARDIAC ELECTROPHYSIOLOGY STUDY AND ABLATION  2005- AT [facility]   SVT--  PT STATES NO ISSUES SINCE   . ESOPHAGOGASTRODUODENOSCOPY (EGD) WITH PROPOFOL N/A 11/02/2017   Procedure:  ESOPHAGOGASTRODUODENOSCOPY (EGD) WITH PROPOFOL;  Surgeon: Lucilla Lame, MD;  Location: [place];  Service: Endoscopy;  Laterality: N/A;  . INTERSTIM IMPLANT PLACEMENT  2009[facility]  . INTERSTIM IMPLANT PLACEMENT  10/05/2011   Procedure: Barrie Lyme IMPLANT FIRST STAGE;  Surgeon: [name], MD;  Location: [facility];  Service: Urology;  Laterality: N/A;  . INTERSTIM IMPLANT PLACEMENT  10/05/2011   Procedure: Barrie Lyme IMPLANT SECOND STAGE;  Surgeon: [name], MD;  Location: [facility];  Service: Urology;  Laterality: N/A;  . INTERSTIM IMPLANT PLACEMENT N/A 11/13/2016   Procedure: Barrie Lyme IMPLANT FIRST STAGE;  Surgeon: [name], MD;  Location: [facility];  Service: Urology;  Laterality: N/A;  . INTERSTIM IMPLANT PLACEMENT N/A 11/13/2016   Procedure: Barrie Lyme IMPLANT SECOND STAGE;  Surgeon: [name], MD;  Location: [facility];  Service: Urology;  Laterality: N/A;  . INTERSTIM IMPLANT REMOVAL N/A 11/13/2016   Procedure: REMOVAL OF INTERSTIM IMPLANT ONE AND TWO;  Surgeon: [name], MD;  Location: [facility];  Service: Urology;  Laterality: N/A;  . INTERSTIM IMPLANT REVISION N/A 03/06/2013   Procedure: REVISION OF Barrie Lyme;  Surgeon: [name], MD;  Location: [facility];  Service: Urology;  Laterality: N/A;  . LAPAROSCOPIC CHOLECYSTECTOMY  2001  . TONSILLECTOMY  1998  . VAGINAL HYSTERECTOMY  2003     Family History: Family History  Problem Relation Age of Onset  . Hypertension Father   . Colon cancer Neg Hx      Social History: Social History   Socioeconomic History  . Marital status: Married    Spouse name: Not on file  .  Number of children: 2  . Years of education: Not on file  . Highest education level: Not on file  Occupational History  . Occupation: Optician, dispensing: Advertising copywriter: Airline pilot  Social Needs  . Financial resource strain: Not on file  . Food insecurity:    Worry: Not on file    Inability: Not on file  . Transportation needs:    Medical: Not on file    Non-medical: Not on file  Tobacco Use  . Smoking status: Never Smoker  . Smokeless tobacco: Never Used  Substance and Sexual Activity  . Alcohol use: No  . Drug use: No  . Sexual activity: Not on file  Lifestyle  . Physical activity:    Days per week: Not on file    Minutes per session: Not on file  . Stress: Not on file  Relationships  . Social connections:    Talks on phone: Not on file    Gets together: Not on file    Attends religious service: Not on file    Active member of club or organization: Not on file    Attends meetings of clubs or organizations: Not on file    Relationship status: Not on file  . Intimate partner violence:    Fear of current or ex partner: Not on file    Emotionally abused: Not on file    Physically abused: Not on file    Forced sexual activity: Not on file  Other Topics Concern  . Not on file  Social History Narrative  . Not on file     Review of Systems: Review of Systems  Constitutional: Positive for malaise/fatigue and weight loss. Negative for chills, diaphoresis and fever.  HENT: Negative.  Negative for congestion, ear discharge, ear pain, hearing loss, nosebleeds, sinus pain, sore throat and tinnitus.   Eyes: Negative.  Negative for blurred vision, double vision, photophobia, pain, discharge and redness.  Respiratory: Positive for cough, sputum production and wheezing. Negative for hemoptysis, shortness of breath and stridor.   Cardiovascular: Negative.  Negative for chest pain, palpitations, orthopnea, claudication, leg swelling and PND.  Gastrointestinal: Positive for heartburn. Negative for abdominal pain, blood in stool, constipation, diarrhea, melena, nausea and vomiting.  Genitourinary: Negative for dysuria, flank pain, frequency, hematuria and  urgency.       Inability to urinate  Musculoskeletal: Negative.  Negative for back pain, falls, joint pain, myalgias and neck pain.  Skin: Negative.  Negative for itching and rash.  Neurological: Positive for weakness. Negative for dizziness, tingling, tremors, sensory change, speech change, focal weakness, seizures, loss of consciousness and headaches.  Endo/Heme/Allergies: Negative.  Negative for environmental allergies and polydipsia. Does not bruise/bleed easily.  Psychiatric/Behavioral: Negative.  Negative for depression, hallucinations, memory loss, substance abuse and suicidal ideas. The patient is not nervous/anxious and does not have insomnia.     Vital Signs: Blood pressure 120/66, pulse 72, temperature 98.5 F (36.9 C), temperature source Oral, resp. rate 16, height 5\' 2"  (1.575 m), weight 50.3 kg (111 lb), SpO2 100 %.  Weight trends: Filed Weights   12/16/17 0436 12/16/17 0916  Weight: 49 kg (108 lb) 50.3 kg (111 lb)    Physical Exam: General: NAD, slender  Head: Normocephalic, atraumatic. Moist oral mucosal membranes  Eyes: Anicteric, PERRL  Neck: Supple, trachea midline  Lungs:  Clear to auscultation  Heart: Regular rate and rhythm  Abdomen:  Soft, nontender,   Extremities: no peripheral edema.  Neurologic: Nonfocal, moving all four extremities  Skin: No lesions        Lab results: Basic Metabolic Panel: Recent Labs  Lab 12/16/17 0606  NA 141  K <2.0*  CL 106  CO2 29  GLUCOSE 95  BUN 11  CREATININE 0.62  CALCIUM 8.7*  MG 1.5*  PHOS <1.0*    Liver Function Tests: No results for input(s): AST, ALT, ALKPHOS, BILITOT, PROT, ALBUMIN in the last 168 hours. No results for input(s): LIPASE, AMYLASE in the last 168 hours. No results for input(s): AMMONIA in the last 168 hours.  CBC: Recent Labs  Lab 12/16/17 0430  WBC 11.4*  HGB 13.1  HCT 37.8  MCV 89.6  PLT 265    Cardiac Enzymes: No results for input(s): CKTOTAL, CKMB, CKMBINDEX, TROPONINI in  the last 168 hours.  BNP: Invalid input(s): POCBNP  CBG: No results for input(s): GLUCAP in the last 168 hours.  Microbiology: Results for orders placed or performed in visit on 10/10/16  Microscopic Examination     Status: Abnormal   Collection Time: 10/10/16  2:28 PM  Result Value Ref Range Status   WBC, UA 0-5 0 - 5 /hpf Final   RBC, UA >30 (A) 0 - 2 /hpf Final   Epithelial Cells (non renal) 0-10 0 - 10 /hpf Final   Bacteria, UA None seen None seen/Few Final    Coagulation Studies: No results for input(s): LABPROT, INR in the last 72 hours.  Urinalysis: No results for input(s): COLORURINE, LABSPEC, PHURINE, GLUCOSEU, HGBUR, BILIRUBINUR, KETONESUR, PROTEINUR, UROBILINOGEN, NITRITE, LEUKOCYTESUR in the last 72 hours.  Invalid input(s): APPERANCEUR    Imaging:  No results found.   Assessment & Plan: Kayla Curry is a 47 y.o.  female with Gittelman/Barter, hypothyroidism, GERD, asthma, right adrenal adenoma, hysterectomy, appendectomy, cholecystectomy, who was admitted to [facility] on 12/16/2017 for Hypokalemia [E87.6] Hypomagnesemia [E83.42] Hypokalemic periodic paralysis [G72.3] Hypophosphatemia [E83.39] Pain in both lower extremities [M79.604, M79.605]  1. Hypokalemia 2. Hypomagnesemia 3. Hypophosphatemia 4. Hypertension  Plan Continue IV and PO electrolyte replacement Continue spironolactone Consider liquid potassium chloride PO No longer able to take NSAIDs, which is used as a treatment for Gitelman/Barter syndromes Consider adding an ACE-I to help with potassium levels.   LOS: 0 Neamiah Sciarra 4/28/201912:01 PM

## 2017-12-16 NOTE — ED Provider Notes (Signed)
[facility] Emergency Department Provider Note   ____________________________________________   First MD Initiated Contact with Patient 12/16/17 [phone]     (approximate)  I have reviewed the triage vital signs and the nursing notes.   HISTORY  Chief Complaint Leg Pain    HPI ANALYSE Kayla Curry is a 47 y.o. female this is a 47 year old female who comes into the hospital today with some throbbing in her legs.  The patient has a history of a spinal cord injury back in 1999 and she has a spinal cord stimulator which helps her with bladder control.  At 10 PM the patient states that she felt her legs go weak.  They thought there was a problem with the stimulator so they turned it off.  The patient woke up out of sleep with some pain to her bilateral legs in the groin area.  The pain is anterior and she rates it a 10 out of 10 in intensity.  The patient states that she cannot walk and cannot put weight on her legs.  The patient took some oxycodone before she went to sleep for her discomfort.  She states that she has never had pain like this before.  The patient is here for evaluation.   Past Medical History:  Diagnosis Date  . Adrenal adenoma, right   . Asthma   . Chronic pain syndrome   . GERD (gastroesophageal reflux disease)   . Gitelman syndrome    DISORDER MAGNESIUM METABOLISM  . History of pelvic fracture   . History of supraventricular tachycardia    PER PT ON 02-28-2013 NO ISSUES SINCE ABLATION IN 2005  . Hypokalemia    CHRONIC  . Hypothyroidism   . 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 Magnesium metabolism disorder NEPHROLOGIST-  DR [name] ([facility] IN [place])   [place]  . Malfunction of device    INTERSTIM  . Neurogenic bladder   . PONV (postoperative nausea and vomiting)    reports PONV and rash with propofol  . S/P ablation of ventricular arrhythmia HX SVT--  2005    [place]--  LAST VISIT 2012 -- PT RELEASED FROM CARE ON PRN BASIS  . Self-catheterizes urinary bladder   . Urinary retention    RESIDUAL FROM PELVIC FX INJURY---Self cath TID and prn  . Wears contact lenses     Patient Active Problem List   Diagnosis Date Noted  . Epigastric pain   . Gastritis without bleeding   . Other diseases of stomach and duodenum   . Chest pain 04/07/2016  . Back ache 06/10/2015  . GERD (gastroesophageal reflux disease) 06/10/2015  . Insomnia 06/10/2015  . Menopausal disorder 06/10/2015  . Neurogenic bladder disorder 06/10/2015  . Pulmonary nodule 06/29/2011  . Vitamin D deficiency 11/07/2009  . Alkalosis 11/04/2009  . Palpitations 04/24/2006  . Asthma, extrinsic 09/29/2005  . Chronic pain associated with significant psychosocial dysfunction 09/29/2005  . Hypokalemia 09/29/2005  . Hypothyroidism 09/29/2005  . Adrenal mass ([place]) 08/21/2001    Past Surgical History:  Procedure Laterality Date  . APPENDECTOMY  1994   EXPL. LAP.  Marland Kitchen CARDIAC ELECTROPHYSIOLOGY STUDY AND ABLATION  2005- AT [facility]   SVT--  PT STATES NO ISSUES SINCE   . ESOPHAGOGASTRODUODENOSCOPY (EGD) WITH PROPOFOL N/A 11/02/2017   Procedure: ESOPHAGOGASTRODUODENOSCOPY (EGD) WITH PROPOFOL;  Surgeon: Lucilla Lame, MD;  Location: [place];  Service: Endoscopy;  Laterality: N/A;  . INTERSTIM IMPLANT PLACEMENT  2009[facility]  . INTERSTIM IMPLANT PLACEMENT  10/05/2011   Procedure: Barrie Lyme IMPLANT FIRST STAGE;  Surgeon: [name], MD;  Location: [facility];  Service: Urology;  Laterality: N/A;  . INTERSTIM IMPLANT PLACEMENT  10/05/2011   Procedure: Barrie Lyme IMPLANT SECOND STAGE;  Surgeon: [name], MD;  Location: [facility];  Service: Urology;  Laterality: N/A;  . INTERSTIM IMPLANT PLACEMENT N/A 11/13/2016   Procedure: Barrie Lyme IMPLANT FIRST STAGE;  Surgeon: [name], MD;  Location: [facility];  Service: Urology;  Laterality: N/A;  . INTERSTIM IMPLANT PLACEMENT N/A 11/13/2016   Procedure: Barrie Lyme IMPLANT SECOND STAGE;  Surgeon: [name], MD;  Location: [facility];  Service: Urology;  Laterality: N/A;  . INTERSTIM IMPLANT REMOVAL N/A 11/13/2016   Procedure: REMOVAL OF INTERSTIM IMPLANT ONE AND TWO;  Surgeon: [name], MD;  Location[facility];  Service: Urology;  Laterality: N/A;  . INTERSTIM IMPLANT REVISION N/A 03/06/2013   Procedure: REVISION OF Barrie Lyme;  Surgeon: [name], MD;  Location: [facility];  Service: Urology;  Laterality: N/A;  . LAPAROSCOPIC CHOLECYSTECTOMY  2001  . TONSILLECTOMY  1998  . VAGINAL HYSTERECTOMY  2003    Prior to Admission medications   Medication Sig Start Date End Date Taking? Authorizing Provider  azithromycin (ZITHROMAX) 250 MG tablet Take 2 tablets by mouth the first day then one daily for 4 days. 12/14/17  Yes Chrismon, Vickki Muff, PA  baclofen (LIORESAL) 20 MG tablet TAKE 1 TABLET (20 MG TOTAL) BY MOUTH 4 (FOUR) TIMES DAILY. 12/02/17  Yes [name], MD  esomeprazole (NEXIUM) 40 MG capsule Take 1 capsule (40 mg total) by mouth daily before breakfast. 06/04/12  Yes Ladene Artist, MD  gabapentin (NEURONTIN) 600 MG tablet TAKE 1 TABLET (600 MG TOTAL) BY MOUTH 3 (THREE) TIMES DAILY. 11/01/17  Yes [name], MD  metaproterenol (ALUPENT) 20 MG tablet Take 1 tablet (20 mg total) by mouth 3 (three) times daily as needed. 02/14/16  Yes [name], MD  oxyCODONE-acetaminophen (PERCOCET) 7.5-325 MG tablet One tablet every 4-5 hours as needed 10/15/17  Yes [name], MD  Potassium 99 MG TABS Take 1 tablet by mouth 2 (two) times daily.    Yes [provider]  potassium chloride 20 MEQ/15ML (10%) SOLN Take 7.5 mLs (10 mEq total) by mouth daily for 30 doses. 12/13/17 01/12/18 Yes Lucilla Lame, MD  predniSONE (DELTASONE) 5 MG tablet Take 1 tablet  (5 mg total) by mouth daily with breakfast. Taper dose daily starting with 6 tablets the first day (6,5,4,3,2,1). 12/14/17  Yes Chrismon, Vickki Muff, PA  promethazine (PHENERGAN) 25 MG tablet Take 1 tablet (25 mg total) by mouth every 6 (six) hours as needed for nausea. 10/06/17  Yes [name], MD  spironolactone (ALDACTONE) 100 MG tablet TAKE 1 TABLET (100 MG TOTAL) BY MOUTH 2 (TWO) TIMES DAILY. 10/11/16  Yes [name], MD  SYNTHROID 75 MCG tablet TAKE 1 TABLET BY MOUTH DAILY. PLEASE SCHEDULE OFFICE VISIT FOR FOLLOWUP 06/19/17  Yes [name], MD  tiZANidine (ZANAFLEX) 4 MG tablet TAKE 1 TABLET (4 MG TOTAL) BY MOUTH 2 (TWO) TIMES DAILY. 11/01/17  Yes [name], MD  EPINEPHrine (EPIPEN 2-PAK) 0.3 mg/0.3 mL IJ SOAJ injection Inject into the muscle. 08/04/10   [provider]  sucralfate (CARAFATE) 1 GM/10ML suspension Take 10 mLs (  1 g total) by mouth 4 (four) times daily. Patient not taking: Reported on 12/14/2017 11/29/17   Lucilla Lame, MD    Allergies Advair diskus [fluticasone-salmeterol]; Albuterol sulfate; Atrovent; Bethanechol; Budesonide-formoterol fumarate; Influenza vac split quad; Influenza vaccines; Ipratropium; Ondansetron; Shellfish allergy; Xopenex [levalbuterol]; Compazine; Fluoride preparations; Lorazepam; Levofloxacin; Other; Potassium-containing compounds; Propofol; and Zofran [ondansetron hcl]  Family History  Problem Relation Age of Onset  . Hypertension Father   . Colon cancer Neg Hx     Social History Social History   Tobacco Use  . Smoking status: Never Smoker  . Smokeless tobacco: Never Used  Substance Use Topics  . Alcohol use: No  . Drug use: No    Review of Systems  Constitutional: No fever/chills Eyes: No visual changes. ENT: No sore throat. Cardiovascular: Denies chest pain. Respiratory: Denies shortness of breath. Gastrointestinal: No abdominal pain.  No nausea, no vomiting.  No diarrhea.  No constipation. Genitourinary:  Negative for dysuria. Musculoskeletal: Leg pain Skin: Negative for rash. Neurological: Bilateral lower extremity weakness   ____________________________________________   PHYSICAL EXAM:  VITAL SIGNS: ED Triage Vitals  Enc Vitals Group     BP 12/16/17 0430 124/81     Pulse Rate 12/16/17 0430 83     Resp 12/16/17 0434 (!) 22     Temp 12/16/17 0434 98.6 F (37 C)     Temp Source 12/16/17 0434 Oral     SpO2 12/16/17 0430 100 %     Weight 12/16/17 0436 108 lb (49 kg)     Height 12/16/17 0436 5\' 2"  (1.575 m)     Head Circumference --      Peak Flow --      Pain Score 12/16/17 0435 10     Pain Loc --      Pain Edu? --      Excl. in [place]? --     Constitutional: Alert and oriented. Crying and in moderate to severe distress. Eyes: Conjunctivae are normal. PERRL. EOMI. Head: Atraumatic. Nose: No congestion/rhinnorhea. Mouth/Throat: Mucous membranes are moist.  Oropharynx non-erythematous. Cardiovascular: Normal rate, regular rhythm. Grossly normal heart sounds.  Good peripheral circulation. Respiratory: Normal respiratory effort.  No retractions. Lungs CTAB. Gastrointestinal: Soft and nontender. No distention. Positive bowel sounds Musculoskeletal: No lower extremity tenderness nor edema.   Neurologic:  Normal speech and language.  Patient able to move toes but unable to raise legs against gravity bilaterally Skin:  Skin is warm, dry and intact.  Psychiatric: Mood and affect are normal.   ____________________________________________   LABS (all labs ordered are listed, but only abnormal results are displayed)  Labs Reviewed  CBC - Abnormal; Notable for the following components:      Result Value   WBC 11.4 (*)    All other components within normal limits  BASIC METABOLIC PANEL - Abnormal; Notable for the following components:   Potassium <2.0 (*)    Calcium 8.7 (*)    All other components within normal limits  MAGNESIUM - Abnormal; Notable for the following components:    Magnesium 1.5 (*)    All other components within normal limits  PHOSPHORUS - Abnormal; Notable for the following components:   Phosphorus <1.0 (*)    All other components within normal limits   ____________________________________________  EKG  ED ECG REPORT I, Loney Hering, the attending physician, personally viewed and interpreted this ECG.   Date: 12/16/2017  EKG Time: 605  Rate: 64  Rhythm: normal sinus rhythm  Axis: normal  Intervals:Prolonged  QTC  ST&T Change: Diffuse T wave flattening with some possible u waves  ____________________________________________  RADIOLOGY  ED MD interpretation:  none  Official radiology report(s): No results found.  ____________________________________________   PROCEDURES  Procedure(s) performed: please, see procedure note(s).  .Critical Care Performed by: Loney Hering, MD Authorized by: Loney Hering, MD   Critical care provider statement:    Critical care time (minutes):  30   Critical care start time:  12/16/2017 4:30 AM   Critical care end time:  12/16/2017 7:00 AM   Critical care time was exclusive of:  Separately billable procedures and treating other patients   Critical care was necessary to treat or prevent imminent or life-threatening deterioration of the following conditions:  Metabolic crisis   Critical care was time spent personally by me on the following activities:  Development of treatment plan with patient or surrogate, evaluation of patient's response to treatment, examination of patient, obtaining history from patient or surrogate, ordering and performing treatments and interventions, ordering and review of laboratory studies, pulse oximetry, re-evaluation of patient's condition and review of old charts   I assumed direction of critical care for this patient from another provider in my specialty: no      Critical Care performed: No  ____________________________________________   INITIAL  IMPRESSION / ASSESSMENT AND PLAN / ED COURSE  As part of my medical decision making, I reviewed the following data within the electronic MEDICAL RECORD NUMBER Notes from prior ED visits and [place] Controlled Substance Database   This is a 47 year old female who comes into the hospital today with some leg pain as well as weakness.    Given the patient's history of low potassium my differential diagnosis includes musculoskeletal pain, cramping, electrolyte abnormalities  The patient did receive a dose of Valium 5 mg orally to help with her pain initially.  We did check some blood work and her initial potassium came back at less than 2 with a phosphorus of less than 1.  The lab was concerned so after discussing with the charge nurse the decision was made to redraw to ensure that the blood work is appropriate.  On redraw the patient's potassium was less than 2 and the patient's phosphorus was less than 1.  The patient received a dose of potassium orally 40 mEq and I did then order 6 doses of KCl IV 10 mg over an hour.  The patient also will receive a dose of magnesium sulfate 2 g.  The patient then started having some more pain so she received some morphine and some Phenergan.  The patient will be admitted to the hospitalist service.      ____________________________________________   FINAL CLINICAL IMPRESSION(S) / ED DIAGNOSES  Final diagnoses:  Hypokalemic periodic paralysis  Pain in both lower extremities  Hypokalemia  Hypomagnesemia  Hypophosphatemia     ED Discharge Orders    None       Note:  This document was prepared using Dragon voice recognition software and may include unintentional dictation errors.    Loney Hering, MD 12/16/17 [phone]

## 2017-12-16 NOTE — Progress Notes (Signed)
BP 106/56, HR 69, 99% on RA.

## 2017-12-16 NOTE — Progress Notes (Signed)
[place] responded to rapid response/code blue page to patient's room. Patient's spouse was in hallway sitting in chair. Medical team was in room. Patient being taken to ICU. Spouse was speaking with medical staff. [place] available for follow-up. [place] received another page.

## 2017-12-16 NOTE — Progress Notes (Addendum)
Notified Dr. Posey Pronto of patient stating her pain 10/10, crying, grimacing. Received a one time order of morphine 2mg . BP 108/56, HR 56, 98% on RA.

## 2017-12-16 NOTE — ED Notes (Signed)
Transported to floor by Alfredo Martinez

## 2017-12-16 NOTE — ED Notes (Signed)
Pt reports no cramping only weakness to lower legs. Pt notified of plan of care.

## 2017-12-16 NOTE — ED Notes (Signed)
Admitting Provider at bedside. 

## 2017-12-16 NOTE — Progress Notes (Signed)
Given report on patient by dayshift RN and patient complaining of 10/10 pain during bedside report. Day shift nurse administered prescribed morphine for pain. I followed up approximately 20 minutes after administration and patient stated 5/10 pain with vital signs within normal limits, alert & oriented x4.  Hospitalist was contacted several times between 1930 and 2030 to address pain, anxiety, and urinary retention. Hospitalist verbally ordered urinary catheter, lasix, xanax for anxiety. Respiratory Therapist also contacted for additional respiratory evaluation.   Foley was inserted and medications administered. Solu-medrol and Chest Xray ordered. Confirmed with patient prior to administration that she was not allergic to solu-medrol and had taken it without incident; patient confirmed this to be true.   Patient became unresponsive to verbal and pain; pulse present; exhibiting breathing difficulties; code blue was called. This nurse administered 37ml of narcan. This nurse gave report to ICU RN Kenney Houseman) and patient was transported to ICU at approximately 2200.

## 2017-12-17 DIAGNOSIS — F4323 Adjustment disorder with mixed anxiety and depressed mood: Secondary | ICD-10-CM

## 2017-12-17 LAB — BASIC METABOLIC PANEL
ANION GAP: 8 (ref 5–15)
Anion gap: 6 (ref 5–15)
BUN: <5 mg/dL — ABNORMAL LOW (ref 6–20)
CHLORIDE: 109 mmol/L (ref 101–111)
CO2: 28 mmol/L (ref 22–32)
CO2: 24 mmol/L (ref 22–32)
CREATININE: 0.67 mg/dL (ref 0.44–1.00)
Calcium: 7.2 mg/dL — ABNORMAL LOW (ref 8.9–10.3)
Calcium: 7.3 mg/dL — ABNORMAL LOW (ref 8.9–10.3)
Chloride: 110 mmol/L (ref 101–111)
Creatinine, Ser: 0.6 mg/dL (ref 0.44–1.00)
GFR calc Af Amer: >60 mL/min (ref >60)
GFR calc non Af Amer: >60 mL/min (ref >60)
GLUCOSE: 125 mg/dL — AB (ref 65–99)
Glucose, Bld: 144 mg/dL — ABNORMAL HIGH (ref 65–99)
POTASSIUM: 5.4 mmol/L — AB (ref 3.5–5.1)
Potassium: 6.5 mmol/L (ref 3.5–5.1)
SODIUM: 143 mmol/L (ref 135–145)
Sodium: 142 mmol/L (ref 135–145)

## 2017-12-17 LAB — MAGNESIUM
MAGNESIUM: 2.6 mg/dL — AB (ref 1.7–2.4)
MAGNESIUM: 2.3 mg/dL (ref 1.7–2.4)
MAGNESIUM: 2.2 mg/dL (ref 1.7–2.4)

## 2017-12-17 LAB — PHOSPHORUS
Phosphorus: 3.6 mg/dL (ref 2.5–4.6)
Phosphorus: 3.4 mg/dL (ref 2.5–4.6)
Phosphorus: 2.6 mg/dL (ref 2.5–4.6)

## 2017-12-17 LAB — CK: Total CK: 208 U/L (ref 38–234)

## 2017-12-17 LAB — POTASSIUM
POTASSIUM: 4.5 mmol/L (ref 3.5–5.1)
POTASSIUM: 5.2 mmol/L — AB (ref 3.5–5.1)

## 2017-12-17 LAB — TROPONIN I: Troponin I: <0.03 ng/mL (ref <0.03)

## 2017-12-17 LAB — GLUCOSE, CAPILLARY: GLUCOSE-CAPILLARY: 145 mg/dL — AB (ref 65–99)

## 2017-12-17 MED ORDER — DIPHENHYDRAMINE HCL 50 MG/ML IJ SOLN
12.5000 mg | INTRAMUSCULAR | Status: AC
  Administered 2017-12-17: 12.5 mg via INTRAVENOUS
  Filled 2017-12-17: qty 1

## 2017-12-17 MED ORDER — DIPHENHYDRAMINE HCL 50 MG/ML IJ SOLN
25.0000 mg | Freq: Three times a day (TID) | INTRAMUSCULAR | Status: DC | PRN
  Administered 2017-12-17: 25 mg via INTRAVENOUS

## 2017-12-17 MED ORDER — DIPHENHYDRAMINE HCL 50 MG/ML IJ SOLN
INTRAMUSCULAR | Status: AC
  Filled 2017-12-17: qty 1

## 2017-12-17 MED ORDER — FENTANYL CITRATE (PF) 100 MCG/2ML IJ SOLN
12.5000 ug | Freq: Once | INTRAMUSCULAR | Status: AC
  Administered 2017-12-17: 12.5 ug via INTRAVENOUS
  Filled 2017-12-17: qty 2

## 2017-12-17 MED ORDER — NITROGLYCERIN 0.4 MG SL SUBL: 0.4000 mg | SUBLINGUAL_TABLET | SUBLINGUAL | Status: DC | PRN

## 2017-12-17 MED ORDER — OXYCODONE-ACETAMINOPHEN 7.5-325 MG PO TABS
1.0000 | ORAL_TABLET | Freq: Three times a day (TID) | ORAL | Status: DC | PRN
  Administered 2017-12-18 – 2017-12-19 (×2): 1 via ORAL
  Filled 2017-12-17 (×2): qty 1

## 2017-12-17 MED ORDER — DEXAMETHASONE SODIUM PHOSPHATE 10 MG/ML IJ SOLN
10.0000 mg | Freq: Three times a day (TID) | INTRAMUSCULAR | Status: DC
  Administered 2017-12-17 – 2017-12-18 (×2): 10 mg via INTRAVENOUS
  Filled 2017-12-17 (×3): qty 1

## 2017-12-17 MED ORDER — OXYCODONE HCL 5 MG PO TABS
5.0000 mg | ORAL_TABLET | Freq: Once | ORAL | Status: AC
  Administered 2017-12-17: 5 mg via ORAL
  Filled 2017-12-17: qty 1

## 2017-12-17 MED ORDER — DIPHENHYDRAMINE HCL 50 MG/ML IJ SOLN
25.0000 mg | Freq: Once | INTRAMUSCULAR | Status: AC
  Administered 2017-12-17: 25 mg via INTRAVENOUS

## 2017-12-17 MED ORDER — ZIPRASIDONE MESYLATE 20 MG IM SOLR
10.0000 mg | Freq: Once | INTRAMUSCULAR | Status: AC
  Administered 2017-12-17: 10 mg via INTRAMUSCULAR
  Filled 2017-12-17 (×2): qty 20

## 2017-12-17 MED ORDER — DIPHENHYDRAMINE HCL 25 MG PO CAPS
25.0000 mg | ORAL_CAPSULE | Freq: Three times a day (TID) | ORAL | Status: DC | PRN
  Administered 2017-12-17: 25 mg via ORAL
  Filled 2017-12-17 (×3): qty 1

## 2017-12-17 NOTE — Progress Notes (Signed)
Dr. Soyla Murphy notified of elevated potassium. IVF stopped and redraw ordered. Patient given IV 12.5 fentanyl per verbal order from Dr. Soyla Murphy for ongoing pain 10/10 reported from patient. Wilnette Kales

## 2017-12-17 NOTE — Consult Note (Signed)
PHARMACY CONSULT NOTE - INITIAL   Pharmacy Consult for Electrolyte Monitoring and Replacement   Labs: Recent Labs    12/16/17 0430  12/16/17 2208 12/16/17 2209 12/17/17 0656 12/17/17 0753 12/17/17 1409  WBC 11.4*  --  16.8*  --   --   --   --   HGB 13.1  --  13.3  --   --   --   --   HCT 37.8  --  38.1  --   --   --   --   PLT 265  --  279  --   --   --   --   APTT  --   --  25  --   --   --   --   CREATININE  --    < >  --  0.58 0.67 0.60  --   MG  --    < >  --  1.6* 2.6*  --  2.3  PHOS  --    < >  --  2.1* 3.6  --  3.4   < > = values in this interval not displayed.   Potassium (mmol/L)  Date Value  12/17/2017 4.5  01/31/2013 4.1   Magnesium (mg/dL)  Date Value  12/17/2017 2.3   Calcium (mg/dL)  Date Value  12/17/2017 7.3 (L)   Calcium, Total (mg/dL)  Date Value  01/31/2013 8.8   Albumin (g/dL)  Date Value  06/14/2016 4.8  01/31/2013 4.1   Phosphorus (mg/dL)  Date Value  12/17/2017 3.4  ] Estimated Creatinine Clearance: 70.2 mL/min (by C-G formula based on SCr of 0.6 mg/dL).   Assessment: Pharmacy consulted for electrolyte monitoring and replacement in 47 yo female with hypokalemia, hypophosphatemia, and hypomagnesemia.   Goal of Therapy:  Electrolytes WNL  Plan:  4/29 K+ and Mg were supra therapeutic this morning. Now  Mag, K+ and Phos are therapeutic. No supplementation needed at this time.   Will recheck electrolytes with AM labs and continue to replace as needed.   Pernell Dupre, PharmD, BCPS Clinical Pharmacist 12/17/2017 3:02 PM

## 2017-12-17 NOTE — Significant Event (Signed)
Rapid Response Event Note  Overview: Time Called: 2057 Arrival Time: 2101 Event Type: Respiratory  Initial Focused Assessment: Unit staff called Code Blue on this RN's way to the rapid. Pt. Agonally breathing, minimally responsive  Interventions: Assisted in Code Blue activities.  See Code documentation. Pt. VSS, pt. Did not lose pulse and became more responsive post narcan.  Pt. placed on non-rebreather. Dr. Anselm Jungling & Dr. Jacqualine Code agreed in transferring pt. To ICU for closer monitoring.  Event Summary: Name of Physician Notified: Dr. Anselm Jungling at 2100  Name of Consulting Physician Notified: Dr. Jacqualine Code at 2100  Outcome: Transferred (Comment)(transferred to ICU-17)  Event End Time: 2145  Domingo Pulse [place]

## 2017-12-17 NOTE — Progress Notes (Signed)
Patient complained of tongue swelling and "drawing of stomach muscles". Patient stated that this happened to her 20 years ago and it was a reaction to medication. Dr. Soyla Murphy notified and came to assess patient. Patient given 25 mg IV benadryl. Dose given at 1540, per Dr. Soyla Murphy okay to given another dose. Wilnette Kales

## 2017-12-17 NOTE — Telephone Encounter (Signed)
Pt currently admitted to [facility]. Hold prescription.

## 2017-12-17 NOTE — Plan of Care (Signed)
Patient given potassium and magnesium replacement. Tolerating well. Will await lab results.  Complaints of pain throughout shift.  PO meds not given during shift as patient was unable tolerate it (crying, moaning, tearful). Will monitor.

## 2017-12-17 NOTE — Progress Notes (Signed)
[facility] [place] NAME: Kayla Curry    MR#:  [id number]  DATE OF BIRTH:  1971/07/27  SUBJECTIVE:  patient became unresponsive yesterday evening. She was transferred to ICU for possible opioid side effects. She received Narcan and responded. Constantly keeps complaining of pain. Husband and daughter in the room. Wants narcotics REVIEW OF SYSTEMS:   Review of Systems  Constitutional: Negative for chills, fever and weight loss.  HENT: Negative for ear discharge, ear pain and nosebleeds.   Eyes: Negative for blurred vision, pain and discharge.  Respiratory: Negative for sputum production, shortness of breath, wheezing and stridor.   Cardiovascular: Negative for chest pain, palpitations, orthopnea and PND.  Gastrointestinal: Negative for abdominal pain, diarrhea, nausea and vomiting.  Genitourinary: Negative for frequency and urgency.  Musculoskeletal: Positive for back pain and joint pain.  Neurological: Negative for sensory change, speech change, focal weakness and weakness.  Psychiatric/Behavioral: Negative for depression and hallucinations. The patient is not nervous/anxious.    Tolerating Diet:yes Tolerating PT:  pending  DRUG ALLERGIES:   Allergies  Allergen Reactions  . Advair Diskus [Fluticasone-Salmeterol] Shortness Of Breath  . Albuterol Sulfate Shortness Of Breath  . Atrovent Shortness Of Breath  . Bethanechol Shortness Of Breath, Rash and Other (See Comments)    RESPIRATORY DISTRESS  . Budesonide-Formoterol Fumarate Shortness Of Breath  . Influenza Vac Split Quad Anaphylaxis  . Influenza Vaccines Anaphylaxis and Shortness Of Breath  . Ipratropium Shortness Of Breath  . Ondansetron Rash  . Shellfish Allergy Anaphylaxis and Shortness Of Breath  . Xopenex [Levalbuterol] Shortness Of Breath  . Compazine Other (See Comments)    DYSTONIC  . Fluoride Preparations   . Lorazepam Other (See Comments)    "HAS OPPOSITE  EFFECT"  . Levofloxacin Rash  . Other Rash    DYSTONIC  . Potassium-Containing Compounds Nausea And Vomiting    Just the po potassium  No problem with iv  . Propofol Nausea And Vomiting and Rash  . Zofran [Ondansetron Hcl] Rash    VITALS:  Blood pressure 129/67, pulse 98, temperature 98.4 F (36.9 C), temperature source Oral, resp. rate 16, height 5\' 2"  (1.575 m), weight 50.3 kg (111 lb), SpO2 97 %.  PHYSICAL EXAMINATION:   Physical Exam  GENERAL:  47 y.o.-year-old patient lying in the bed with no acute distress. Thin cachectic EYES: Pupils equal, round, reactive to light and accommodation. No scleral icterus. Extraocular muscles intact.  HEENT: Head atraumatic, normocephalic. Oropharynx and nasopharynx clear.  NECK:  Supple, no jugular venous distention. No thyroid enlargement, no tenderness.  LUNGS: Normal breath sounds bilaterally, no wheezing, rales, rhonchi. No use of accessory muscles of respiration.  CARDIOVASCULAR: S1, S2 normal. No murmurs, rubs, or gallops.  ABDOMEN: Soft, nontender, nondistended. Bowel sounds present. No organomegaly or mass.  EXTREMITIES: No cyanosis, clubbing or edema b/l.    NEUROLOGIC: Cranial nerves II through XII are intact. No focal Motor or sensory deficits b/l.   PSYCHIATRIC:  patient is alert and oriented x 3.  SKIN: No obvious rash, lesion, or ulcer.   LABORATORY PANEL:  CBC Recent Labs  Lab 12/16/17 2208  WBC 16.8*  HGB 13.3  HCT 38.1  PLT 279    Chemistries  Recent Labs  Lab 12/17/17 0753 12/17/17 1409  NA 142  --   K 5.4* 4.5  CL 110  --   CO2 24  --   GLUCOSE 125*  --   BUN <5*  --  CREATININE 0.60  --   CALCIUM 7.3*  --   MG  --  2.3   Cardiac Enzymes Recent Labs  Lab 12/17/17 0656  TROPONINI <0.03   RADIOLOGY:  Dg Chest Port 1 View  Result Date: 12/16/2017 CLINICAL DATA:  Shortness of breath EXAM: PORTABLE CHEST 1 VIEW COMPARISON:  04/06/2016 FINDINGS: Normal heart size and mediastinal contours. No acute  infiltrate or edema. No effusion or pneumothorax. No acute osseous findings. Artifact from EKG leads. IMPRESSION: Negative portable chest Electronically Signed   By: Monte Fantasia M.D.   On: 12/16/2017 21:08   ASSESSMENT AND PLAN:   Kayla Curry  is a 47 y.o. female with a known history of hypokalemia, history of Gittleman/Bartter's syndrom, hypothyroidism, [name], benign right adrenal adenoma comes to the emergency room with increasing bilateral lower extremity pain and cramping.  1. bilateral lower extremity cramping secondary to severe electrolyte abnormality secondary to Gettleman/Barter syndrome -patient has low phosphorus, magnesium, potassium -pharmacy consult for electrolyte replacement---electrolytes repeated -nephrology consultation-- appreciated -during pain meds -continue spironolactone  2. Gerd/chronic active gastritis -continue PPI and Carafate -in upper G.I. endoscopy in March 2019  3. Hypothyroidism continue Synthroid  4. Right adrenal adenoma known history. Patient has had extensive workup done at [facility]  5. Narcotic overuse leading to unresponsiveness. -Patient explained that her electrolytes are repeated she should start feeling better. She needs to start working with physical therapy and do more muscle strengthening exercise to help her with her pain. -Seems patient has been seeking narcotics. Daughter and husband aware. -I have discontinued IV morphine -will continue with oral narcotics every eight hours as needed along with muscle relaxants.   Discuss with patient and husband  Can be transferred to medical floor when deemed appropriate by intensivist   Case discussed with Care Management/Social Worker. Management plans discussed with the patient, family and they are in agreement.  CODE STATUS: full  DVT Prophylaxis: lovneonx  TOTAL TIME TAKING CARE OF THIS PATIENT: 40 minutes.  >50% time spent on counselling and coordination of care  POSSIBLE D/C IN  1-2 DAYS, DEPENDING ON CLINICAL CONDITION.  Note: This dictation was prepared with Dragon dictation along with smaller phrase technology. Any transcriptional errors that result from this process are unintentional.  Fritzi Mandes M.D on 12/17/2017 at 4:03 PM  Between 7am to 6pm - Pager - [phone]  After 6pm go to [email or web address] - password [email or web address] [facility]  Office  [phone]  CC: Primary care physician; [name], MDPatient ID: [name], female   DOB: [birth year], 47 y.o.   MRN: [id number]

## 2017-12-17 NOTE — Consult Note (Signed)
[facility] Face-to-Face Psychiatry Consult   Reason for Consult: Consult for 47-year-old woman in the hospital with abnormal electrolytes.  Concerned about extreme emotions and degree of pain Referring Physician: Patel Patient Identification: Kazi A Scahill MRN:  [id number] Principal Diagnosis: Adjustment disorder with mixed anxiety and depressed mood Diagnosis:   Patient Active Problem List   Diagnosis Date Noted  . Adjustment disorder with mixed anxiety and depressed mood [F43.23] 12/17/2017  . Hypomagnesemia [E83.42] 12/16/2017  . Epigastric pain [R10.13]   . Gastritis without bleeding [K29.70]   . Other diseases of stomach and duodenum [K31.89]   . Chest pain [R07.9] 04/07/2016  . Back ache [M54.9] 06/10/2015  . GERD (gastroesophageal reflux disease) [K21.9] 06/10/2015  . Insomnia [G47.00] 06/10/2015  . Menopausal disorder [N95.9] 06/10/2015  . Neurogenic bladder disorder [N31.9] 06/10/2015  . Pulmonary nodule [R91.1] 06/29/2011  . Vitamin D deficiency [E55.9] 11/07/2009  . Alkalosis [E87.3] 11/04/2009  . Palpitations [R00.2] 04/24/2006  . Asthma, extrinsic [J45.909] 09/29/2005  . Chronic pain associated with significant psychosocial dysfunction [G89.4] 09/29/2005  . Hypokalemia [E87.6] 09/29/2005  . Hypothyroidism [E03.9] 09/29/2005  . Adrenal mass (HCC) [E27.9] 08/21/2001    Total Time spent with patient: 1 hour  Subjective:   Shardea A Capobianco is a 47 y.o. female patient admitted with "I am in a lot of pain".  HPI: Patient seen chart reviewed.  Reviewed situation with nursing in the intensive care unit.  47-year-old woman with chronic endocrine problems and abnormalities of electrolytes.  Currently experiencing intense pain in her hands and feet.  Evidently last night through this morning the patient was displaying labile and extreme emotion around her pain.  Also apparently last night there was an episode of pulmonary arrest that was thought to be related to excessive narcotics.   Concern was raised about possible excessive pain medicine.  Patient seen chart reviewed.  Patient's daughter was in the room as well.  Daughter was extremely defensive about the situation and attempted to answer all questions and to guide being out of the room however the patient did allow a few questions to be asked.  Patient reports her hands are still hurting badly worse than they ever have before.  Denied outside of that had any emotional concerns denied depression.  Said that she has some trouble sleeping because of the pain but otherwise had not had trouble sleeping at home.  Totally denies suicidal ideation denies hallucinations.  Reports that she takes her oxycodone normally at home for her back pain denies that she had been taking excessive or higher amounts of it or any other drug.  Social history: Patient lives with family.  Evidently supportive.  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  Medical history: See previous notes.  Chronic adrenal problems chronic problems with potassium and other electrolytes related to disease.  Substance abuse history: None  Past Psychiatric History: Patient denies any past psychiatric history denies any history of depression any psychiatric treatment or hospitalization.  Family especially daughter is rather defensive and angry at the suggestion that there could be any kind of psychosomatic component to the current condition either.  Risk to Self: Is patient at risk for suicide?: No Risk to Others:   Prior Inpatient Therapy:   Prior Outpatient Therapy:    Past Medical History:  Past Medical History:  Diagnosis Date  . Adrenal adenoma, right   . Asthma   . Chronic pain syndrome   . GERD (gastroesophageal reflux disease)   . Gitelman syndrome      DISORDER MAGNESIUM METABOLISM  . History of pelvic fracture   . History of supraventricular tachycardia    PER PT ON 02-28-2013 NO ISSUES SINCE ABLATION IN 2005  . Hypokalemia    CHRONIC  .  Hypothyroidism   . 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 Magnesium metabolism disorder NEPHROLOGIST-  DR [name] ([facility] [place])   [place]  . Malfunction of device    INTERSTIM  . Neurogenic bladder   . PONV (postoperative nausea and vomiting)    reports PONV and rash with propofol  . S/P ablation of ventricular arrhythmia HX SVT--  2005   [place]--  LAST VISIT 2012 -- PT RELEASED FROM CARE ON PRN BASIS  . Self-catheterizes urinary bladder   . Urinary retention    RESIDUAL FROM PELVIC FX INJURY---Self cath TID and prn  . Wears contact lenses     Past Surgical History:  Procedure Laterality Date  . APPENDECTOMY  1994   EXPL. LAP.  Marland Kitchen CARDIAC ELECTROPHYSIOLOGY STUDY AND ABLATION  2005- AT [facility]   SVT--  PT STATES NO ISSUES SINCE   . ESOPHAGOGASTRODUODENOSCOPY (EGD) WITH PROPOFOL N/A 11/02/2017   Procedure: ESOPHAGOGASTRODUODENOSCOPY (EGD) WITH PROPOFOL;  Surgeon: Lucilla Lame, MD;  Location: [facility];  Service: Endoscopy;  Laterality: N/A;  . INTERSTIM IMPLANT PLACEMENT  2009[facility]  . INTERSTIM IMPLANT PLACEMENT  10/05/2011   Procedure: Barrie Lyme IMPLANT FIRST STAGE;  Surgeon: [name], MD;  Location: [facility];  Service: Urology;  Laterality: N/A;  . INTERSTIM IMPLANT PLACEMENT  10/05/2011   Procedure: Barrie Lyme IMPLANT SECOND STAGE;  Surgeon: [name], MD;  Location: [facility];  Service: Urology;  Laterality: N/A;  . INTERSTIM IMPLANT PLACEMENT N/A 11/13/2016   Procedure: Barrie Lyme IMPLANT FIRST STAGE;  Surgeon: [name], MD;  Location: [facility];  Service: Urology;  Laterality: N/A;  . INTERSTIM IMPLANT PLACEMENT N/A 11/13/2016   Procedure: Barrie Lyme IMPLANT SECOND STAGE;  Surgeon: [name], MD;  Location: [facility];  Service: Urology;  Laterality: N/A;   . INTERSTIM IMPLANT REMOVAL N/A 11/13/2016   Procedure: REMOVAL OF INTERSTIM IMPLANT ONE AND TWO;  Surgeon: [name], MD;  Location[facility];  Service: Urology;  Laterality: N/A;  . INTERSTIM IMPLANT REVISION N/A 03/06/2013   Procedure: REVISION OF Barrie Lyme;  Surgeon: [name], MD;  Location: [facility];  Service: Urology;  Laterality: N/A;  . LAPAROSCOPIC CHOLECYSTECTOMY  2001  . TONSILLECTOMY  1998  . VAGINAL HYSTERECTOMY  2003   Family History:  Family History  Problem Relation Age of Onset  . Hypertension Father   . Colon cancer Neg Hx    Family Psychiatric  History: None known Social History:  Social History   Substance and Sexual Activity  Alcohol Use No     Social History   Substance and Sexual Activity  Drug Use No    Social History   Socioeconomic History  . Marital status: Married    Spouse name: Not on file  . Number of children: 2  . Years of education: Not on file  . Highest education level: Not on file  Occupational History  . 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 Financial resource strain: Not on file  . Food insecurity:  Worry: Not on file    Inability: Not on file  . Transportation needs:    Medical: Not on file    Non-medical: Not on file  Tobacco Use  . Smoking status: Never Smoker  . Smokeless tobacco: Never Used  Substance and Sexual Activity  . Alcohol use: No  . Drug use: No  . Sexual activity: Not on file  Lifestyle  . Physical activity:    Days per week: Not on file    Minutes per session: Not on file  . Stress: Not on file  Relationships  . Social connections:    Talks on phone: Not on file    Gets together: Not on file    Attends religious service: Not on file    Active member of club or organization: Not on file    Attends meetings of clubs or organizations: Not on file    Relationship status: Not on file    Other Topics Concern  . Not on file  Social History Narrative  . Not on file   Additional Social History:    Allergies:   Allergies  Allergen Reactions  . Advair Diskus [Fluticasone-Salmeterol] Shortness Of Breath  . Albuterol Sulfate Shortness Of Breath  . Atrovent Shortness Of Breath  . Bethanechol Shortness Of Breath, Rash and Other (See Comments)    RESPIRATORY DISTRESS  . Budesonide-Formoterol Fumarate Shortness Of Breath  . Influenza Vac Split Quad Anaphylaxis  . Influenza Vaccines Anaphylaxis and Shortness Of Breath  . Ipratropium Shortness Of Breath  . Ondansetron Rash  . Shellfish Allergy Anaphylaxis and Shortness Of Breath  . Xopenex [Levalbuterol] Shortness Of Breath  . Compazine Other (See Comments)    DYSTONIC  . Fluoride Preparations   . Lorazepam Other (See Comments)    "HAS OPPOSITE EFFECT"  . Levofloxacin Rash  . Other Rash    DYSTONIC  . Potassium-Containing Compounds Nausea And Vomiting    Just the po potassium  No problem with iv  . Propofol Nausea And Vomiting and Rash  . Zofran [Ondansetron Hcl] Rash    Labs:  Results for orders placed or performed during the hospital encounter of 12/16/17 (from the past 48 hour(s))  CBC     Status: Abnormal   Collection Time: 12/16/17  4:30 AM  Result Value Ref Range   WBC 11.4 (H) 3.6 - 11.0 K/uL   RBC 4.21 3.80 - 5.20 MIL/uL   Hemoglobin 13.1 12.0 - 16.0 g/dL   HCT 37.8 35.0 - 47.0 %   MCV 89.6 80.0 - 100.0 fL   MCH 31.1 26.0 - 34.0 pg   MCHC 34.7 32.0 - 36.0 g/dL   RDW 12.6 11.5 - 14.5 %   Platelets 265 150 - 440 K/uL    Comment: Performed at  [facility], [address] [place], [place] [address]  Basic metabolic panel     Status: Abnormal   Collection Time: 12/16/17  6:06 AM  Result Value Ref Range   Sodium 141 135 - 145 mmol/L   Potassium <2.0 (LL) 3.5 - 5.1 mmol/L    Comment: CRITICAL RESULT CALLED TO, READ BACK BY AND VERIFIED WITH SHERRY MAHAN 12/16/17 @ 0643  MLK    Chloride 106  101 - 111 mmol/L   CO2 29 22 - 32 mmol/L   Glucose, Bld 95 65 - 99 mg/dL   BUN 11 6 - 20 mg/dL   Creatinine, Ser 0.62 0.44 - 1.00 mg/dL   Calcium 8.7 (L) 8.9 - 10.3 mg/dL     GFR calc non Af Amer >60 >60 mL/min   GFR calc Af Amer >60 >60 mL/min    Comment: (NOTE) The eGFR has been calculated using the CKD EPI equation. This calculation has not been validated in all clinical situations. eGFR's persistently <60 mL/min signify possible Chronic Kidney Disease.    Anion gap 6 5 - 15    Comment: Performed at [facility], [address] [place], [place] [address]  Magnesium     Status: Abnormal   Collection Time: 12/16/17  6:06 AM  Result Value Ref Range   Magnesium 1.5 (L) 1.7 - 2.4 mg/dL    Comment: Performed at [facility], [address] [place], [place] [address]  Phosphorus     Status: Abnormal   Collection Time: 12/16/17  6:06 AM  Result Value Ref Range   Phosphorus <1.0 (LL) 2.5 - 4.6 mg/dL    Comment: CRITICAL RESULT CALLED TO, READ BACK BY AND VERIFIED WITH SHERRY [facility] 12/16/17 @ [facility] Performed at [facility], [address] [place], [place] [address]   Glucose, capillary     Status: None   Collection Time: 12/16/17  9:09 PM  Result Value Ref Range   Glucose-Capillary 83 65 - 99 mg/dL  CKMB([facility] only)     Status: None   Collection Time: 12/16/17 10:08 PM  Result Value Ref Range   CK, MB 2.2 0.5 - 5.0 ng/mL    Comment: Performed at [facility], [address] [place], [place] [address]  Protime-INR     Status: None   Collection Time: 12/16/17 10:08 PM  Result Value Ref Range   Prothrombin Time 11.9 11.4 - 15.2 seconds   INR 0.88     Comment: Performed at [facility], [address] [place], [place] [address]  APTT     Status: None   Collection Time: 12/16/17 10:08 PM  Result Value Ref Range   aPTT 25 24 - 36 seconds    Comment: Performed at [facility], [address] [place], [place]  [address]  Lactic acid, plasma     Status: None   Collection Time: 12/16/17 10:08 PM  Result Value Ref Range   Lactic Acid, Venous 1.4 0.5 - 1.9 mmol/L    Comment: Performed at [facility], [address] [place], [place] [address]  CBC     Status: Abnormal   Collection Time: 12/16/17 10:08 PM  Result Value Ref Range   WBC 16.8 (H) 3.6 - 11.0 K/uL   RBC 4.28 3.80 - 5.20 MIL/uL   Hemoglobin 13.3 12.0 - 16.0 g/dL   HCT 38.1 35.0 - 47.0 %   MCV 89.0 80.0 - 100.0 fL   MCH 31.0 26.0 - 34.0 pg   MCHC 34.8 32.0 - 36.0 g/dL   RDW 12.8 11.5 - 14.5 %   Platelets 279 150 - 440 K/uL    Comment: Performed at [facility], [address] [place], [place] [address]  Glucose, capillary     Status: Abnormal   Collection Time: 12/16/17 10:08 PM  Result Value Ref Range   Glucose-Capillary 102 (H) 65 - 99 mg/dL  Phosphorus     Status: Abnormal   Collection Time: 12/16/17 10:09 PM  Result Value Ref Range   Phosphorus 2.1 (L) 2.5 - 4.6 mg/dL    Comment: Performed at [facility], [address], [place], [place] [address]  Basic metabolic panel     Status: Abnormal  Collection Time: 12/16/17 10:09 PM  Result Value Ref Range   Sodium 143 135 - 145 mmol/L   Potassium <2.0 (LL) 3.5 - 5.1 mmol/L    Comment: CRITICAL RESULT CALLED TO, READ BACK BY AND VERIFIED WITH TANYA SILVA AT 2234 ON 12/16/17 RWW    Chloride 106 101 - 111 mmol/L   CO2 29 22 - 32 mmol/L   Glucose, Bld 106 (H) 65 - 99 mg/dL   BUN 8 6 - 20 mg/dL   Creatinine, Ser 0.58 0.44 - 1.00 mg/dL   Calcium 8.1 (L) 8.9 - 10.3 mg/dL   GFR calc non Af Amer >60 >60 mL/min   GFR calc Af Amer >60 >60 mL/min    Comment: (NOTE) The eGFR has been calculated using the CKD EPI equation. This calculation has not been validated in all clinical situations. eGFR's persistently <60 mL/min signify possible Chronic Kidney Disease.    Anion gap 8 5 - 15    Comment: Performed at [facility], [address]  [place], [place] [address]  Magnesium     Status: Abnormal   Collection Time: 12/16/17 10:09 PM  Result Value Ref Range   Magnesium 1.6 (L) 1.7 - 2.4 mg/dL    Comment: Performed at [facility], [address] [place], [place] [address]  MRSA PCR Screening     Status: None   Collection Time: 12/16/17 10:22 PM  Result Value Ref Range   MRSA by PCR NEGATIVE NEGATIVE    Comment:        The GeneXpert MRSA Assay (FDA approved for NASAL specimens only), is one component of a comprehensive MRSA colonization surveillance program. It is not intended to diagnose MRSA infection nor to guide or monitor treatment for MRSA infections. Performed at [facility], [address] [place], [place] [address]   Glucose, capillary     Status: Abnormal   Collection Time: 12/17/17  6:44 AM  Result Value Ref Range   Glucose-Capillary 145 (H) 65 - 99 mg/dL  Basic metabolic panel     Status: Abnormal   Collection Time: 12/17/17  6:56 AM  Result Value Ref Range   Sodium 143 135 - 145 mmol/L   Potassium 6.5 (HH) 3.5 - 5.1 mmol/L    Comment: CRITICAL RESULT CALLED TO, READ BACK BY AND VERIFIED WITH BRANDY MANSFIELD AT 0725 ON 12/17/2017 JJB    Chloride 109 101 - 111 mmol/L   CO2 28 22 - 32 mmol/L   Glucose, Bld 144 (H) 65 - 99 mg/dL   BUN <5 (L) 6 - 20 mg/dL   Creatinine, Ser 0.67 0.44 - 1.00 mg/dL   Calcium 7.2 (L) 8.9 - 10.3 mg/dL   GFR calc non Af Amer >60 >60 mL/min   GFR calc Af Amer >60 >60 mL/min    Comment: (NOTE) The eGFR has been calculated using the CKD EPI equation. This calculation has not been validated in all clinical situations. eGFR's persistently <60 mL/min signify possible Chronic Kidney Disease.    Anion gap 6 5 - 15    Comment: Performed at [facility], [address] [place], [place] [address]  Magnesium     Status: Abnormal   Collection Time: 12/17/17  6:56 AM  Result Value Ref Range   Magnesium 2.6 (H) 1.7 - 2.4 mg/dL    Comment: Performed at  [facility], [address] [place], [place] [address]  Phosphorus     Status: None   Collection Time: 12/17/17    6:56 AM  Result Value Ref Range   Phosphorus 3.6 2.5 - 4.6 mg/dL    Comment: Performed at [facility], [address] [place], [place] [address]  CK     Status: None   Collection Time: 12/17/17  6:56 AM  Result Value Ref Range   Total CK 208 38 - 234 U/L    Comment: Performed at [facility], [address] [place], [place] [address]  Troponin I     Status: None   Collection Time: 12/17/17  6:56 AM  Result Value Ref Range   Troponin I <0.03 <0.03 ng/mL    Comment: Performed at [facility], [address] [place], [place] [address]  Basic metabolic panel     Status: Abnormal   Collection Time: 12/17/17  7:53 AM  Result Value Ref Range   Sodium 142 135 - 145 mmol/L   Potassium 5.4 (H) 3.5 - 5.1 mmol/L   Chloride 110 101 - 111 mmol/L   CO2 24 22 - 32 mmol/L   Glucose, Bld 125 (H) 65 - 99 mg/dL   BUN <5 (L) 6 - 20 mg/dL   Creatinine, Ser 0.60 0.44 - 1.00 mg/dL   Calcium 7.3 (L) 8.9 - 10.3 mg/dL   GFR calc non Af Amer >60 >60 mL/min   GFR calc Af Amer >60 >60 mL/min    Comment: (NOTE) The eGFR has been calculated using the CKD EPI equation. This calculation has not been validated in all clinical situations. eGFR's persistently <60 mL/min signify possible Chronic Kidney Disease.    Anion gap 8 5 - 15    Comment: Performed at [facility], [address] [place], [place] [address]  Potassium     Status: None   Collection Time: 12/17/17  2:09 PM  Result Value Ref Range   Potassium 4.5 3.5 - 5.1 mmol/L    Comment: Performed at [facility], [address] [place], [place] [address]  Magnesium     Status: None   Collection Time: 12/17/17  2:09 PM  Result Value Ref Range   Magnesium 2.3 1.7 - 2.4 mg/dL    Comment: Performed at [facility], [address] [place], [place] [address]    Phosphorus     Status: None   Collection Time: 12/17/17  2:09 PM  Result Value Ref Range   Phosphorus 3.4 2.5 - 4.6 mg/dL    Comment: Performed at [facility], [address] [place], [place] [address]    Current Facility-Administered Medications  Medication Dose Route Frequency Provider Last Rate Last Dose  . acetaminophen (TYLENOL) tablet 650 mg  650 mg Oral Q6H PRN Patel, Sona, MD   650 mg at 12/17/17 0811   Or  . acetaminophen (TYLENOL) suppository 650 mg  650 mg Rectal Q6H PRN Patel, Sona, MD      . azithromycin (ZITHROMAX) tablet 250 mg  250 mg Oral Daily Patel, Sona, MD   250 mg at 12/17/17 1031  . baclofen (LIORESAL) tablet 20 mg  20 mg Oral QID Patel, Sona, MD   20 mg at 12/17/17 1539  . diphenhydrAMINE (BENADRYL) injection 25 mg  25 mg Intravenous Q8H PRN Samaan, Maged, MD   25 mg at 12/17/17 1802  . enoxaparin (LOVENOX) injection 40 mg  40 mg Subcutaneous Q24H Patel, Sona, MD   40 mg at 12/16/17 2354  . gabapentin (NEURONTIN) tablet 600 mg  600 mg Oral TID Patel, Sona, MD   600 mg   at 12/17/17 1540  . guaiFENesin-dextromethorphan (ROBITUSSIN DM) 100-10 MG/5ML syrup 10 mL  10 mL Oral Q4H PRN Tukov-Yual, Magdalene S, NP      . levothyroxine (SYNTHROID, LEVOTHROID) tablet 75 mcg  75 mcg Oral QAC breakfast Patel, Sona, MD   75 mcg at 12/17/17 0811  . nitroGLYCERIN (NITROSTAT) SL tablet 0.4 mg  0.4 mg Sublingual Q5 min PRN Tukov-Yual, Magdalene S, NP      . oxyCODONE-acetaminophen (PERCOCET) 7.5-325 MG per tablet 1 tablet  1 tablet Oral Q8H PRN Samaan, Maged, MD      . pantoprazole (PROTONIX) EC tablet 40 mg  40 mg Oral Daily Patel, Sona, MD   40 mg at 12/17/17 1031  . polyethylene glycol (MIRALAX / GLYCOLAX) packet 17 g  17 g Oral Daily PRN Patel, Sona, MD      . promethazine (PHENERGAN) tablet 12.5 mg  12.5 mg Oral Q6H PRN Patel, Sona, MD      . senna (SENOKOT) tablet 8.6 mg  1 tablet Oral BID Patel, Sona, MD   8.6 mg at 12/17/17 1031  . spironolactone (ALDACTONE) tablet  100 mg  100 mg Oral BID Patel, Sona, MD      . tiZANidine (ZANAFLEX) tablet 4 mg  4 mg Oral Q6H PRN Patel, Sona, MD   4 mg at 12/17/17 0811    Musculoskeletal: Strength & Muscle Tone: decreased Gait & Station: unable to stand Patient leans: N/A  Psychiatric Specialty Exam: Physical Exam  Nursing note and vitals reviewed. Constitutional: She appears well-developed.  HENT:  Head: Normocephalic and atraumatic.  Eyes: Pupils are equal, round, and reactive to light. Conjunctivae are normal.  Neck: Normal range of motion.  Cardiovascular: Normal heart sounds.  Respiratory: Effort normal.  GI: Soft.  Musculoskeletal: Normal range of motion.       Arms: Neurological: She is alert.  Skin: Skin is warm and dry.  Psychiatric: Judgment normal. Her affect is blunt. Her speech is delayed. She is slowed. Thought content is not paranoid. Cognition and memory are normal. She expresses no homicidal and no suicidal ideation.    Review of Systems  Constitutional: Negative.   HENT: Negative.   Eyes: Negative.   Respiratory: Negative.   Cardiovascular: Negative.   Gastrointestinal: Negative.   Musculoskeletal: Negative.   Skin: Negative.   Neurological: Negative.        Patient reports extreme pain in both of her hands which is presumed to be related to abnormalities in electrolytes    Blood pressure 135/68, pulse 96, temperature 98.6 F (37 C), temperature source Oral, resp. rate 18, height 5' 2" (1.575 m), weight 50.3 kg (111 lb), SpO2 97 %.Body mass index is 20.3 kg/m.  General Appearance: Casual  Eye Contact:  Minimal  Speech:  Slow  Volume:  Decreased  Mood:  Dysphoric  Affect:  Constricted  Thought Process:  Goal Directed  Orientation:  Full (Time, Place, and Person)  Thought Content:  Logical  Suicidal Thoughts:  No  Homicidal Thoughts:  No  Memory:  Immediate;   Fair Recent;   Fair Remote;   Fair  Judgement:  Fair  Insight:  Shallow  Psychomotor Activity:  Decreased    Concentration:  Concentration: Fair  Recall:  Fair  Fund of Knowledge:  Fair  Language:  Fair  Akathisia:  No  Handed:  Right  AIMS (if indicated):     Assets:  Desire for Improvement Resilience Social Support  ADL's:  Impaired  Cognition:  Impaired,  Mild  Sleep:          Treatment Plan Summary: Plan 47-year-old woman without a past psychiatric history.  Concern apparently raised because of the degree of her complaints earlier today as well as the pulmonary arrest last night.  Patient and family both angrily deny that she has been overusing or misusing any narcotics.  Denying any symptoms of depression or anxiety.  Understanding on their part is a little impaired.  They are of the belief that her "code" last night was only from the electrolyte abnormalities rather than what is presumed to be an excessive amount of narcotic.  In any case I do not see any psychiatric illness to intervene about.  Encourage patient to keep up a positive attitude.  No medications changed or prescribed.  I will follow up only as needed.  Disposition: No evidence of imminent risk to self or others at present.   Patient does not meet criteria for psychiatric inpatient admission. Supportive therapy provided about ongoing stressors.   , MD 12/17/2017 6:20 PM 

## 2017-12-18 LAB — CBC WITH DIFFERENTIAL/PLATELET
BASOS PCT: 0 %
Basophils Absolute: 0.1 10*3/uL (ref 0–0.1)
Eosinophils Absolute: 0 10*3/uL (ref 0–0.7)
Eosinophils Relative: 0 %
HEMATOCRIT: 37.6 % (ref 35.0–47.0)
HEMOGLOBIN: 12.6 g/dL (ref 12.0–16.0)
LYMPHS PCT: 3 %
Lymphs Abs: 0.5 10*3/uL — ABNORMAL LOW (ref 1.0–3.6)
MCH: 30.8 pg (ref 26.0–34.0)
MCHC: 33.5 g/dL (ref 32.0–36.0)
MCV: 91.8 fL (ref 80.0–100.0)
MONO ABS: 0.3 10*3/uL (ref 0.2–0.9)
Monocytes Relative: 1 %
NEUTROS ABS: 21.2 10*3/uL — AB (ref 1.4–6.5)
NEUTROS PCT: 96 %
Platelets: 291 10*3/uL (ref 150–440)
RBC: 4.1 MIL/uL (ref 3.80–5.20)
RDW: 13.1 % (ref 11.5–14.5)
WBC: 22.1 10*3/uL — ABNORMAL HIGH (ref 3.6–11.0)

## 2017-12-18 LAB — BASIC METABOLIC PANEL
ANION GAP: 6 (ref 5–15)
BUN: 6 mg/dL (ref 6–20)
CALCIUM: 7.8 mg/dL — AB (ref 8.9–10.3)
CO2: 22 mmol/L (ref 22–32)
Chloride: 108 mmol/L (ref 101–111)
Creatinine, Ser: 0.66 mg/dL (ref 0.44–1.00)
GFR calc non Af Amer: >60 mL/min (ref >60)
GLUCOSE: 127 mg/dL — AB (ref 65–99)
POTASSIUM: 7 mmol/L — AB (ref 3.5–5.1)
Sodium: 136 mmol/L (ref 135–145)

## 2017-12-18 LAB — CALCIUM, IONIZED: Calcium, Ionized, Serum: 4.2 mg/dL — ABNORMAL LOW (ref 4.5–5.6)

## 2017-12-18 LAB — PHOSPHORUS: PHOSPHORUS: 2.8 mg/dL (ref 2.5–4.6)

## 2017-12-18 LAB — MAGNESIUM: MAGNESIUM: 2.3 mg/dL (ref 1.7–2.4)

## 2017-12-18 LAB — POTASSIUM
POTASSIUM: 4.8 mmol/L (ref 3.5–5.1)
Potassium: 5.4 mmol/L — ABNORMAL HIGH (ref 3.5–5.1)

## 2017-12-18 MED ORDER — DEXTROSE 50 % IV SOLN
50.0000 mL | Freq: Once | INTRAVENOUS | Status: AC
  Administered 2017-12-18: 50 mL via INTRAVENOUS
  Filled 2017-12-18: qty 50

## 2017-12-18 MED ORDER — DIPHENHYDRAMINE HCL 12.5 MG/5ML PO ELIX
12.5000 mg | ORAL_SOLUTION | Freq: Four times a day (QID) | ORAL | Status: DC | PRN
  Administered 2017-12-18: 12.5 mg via ORAL
  Filled 2017-12-18 (×3): qty 5

## 2017-12-18 MED ORDER — INSULIN ASPART 100 UNIT/ML IV SOLN
10.0000 [IU] | Freq: Once | INTRAVENOUS | Status: AC
  Administered 2017-12-18: 10 [IU] via INTRAVENOUS
  Filled 2017-12-18: qty 0.1

## 2017-12-18 MED ORDER — DIPHENHYDRAMINE HCL 12.5 MG/5ML PO ELIX
12.5000 mg | ORAL_SOLUTION | Freq: Four times a day (QID) | ORAL | Status: DC | PRN
  Filled 2017-12-18: qty 5

## 2017-12-18 MED ORDER — POLYETHYLENE GLYCOL 3350 17 G PO PACK: 17.0000 g | PACK | Freq: Every day | ORAL | Status: DC

## 2017-12-18 MED ORDER — PATIROMER SORBITEX CALCIUM 8.4 G PO PACK
8.4000 g | PACK | Freq: Once | ORAL | Status: AC
  Administered 2017-12-18: 8.4 g via ORAL
  Filled 2017-12-18 (×2): qty 4

## 2017-12-18 MED ORDER — DIPHENHYDRAMINE HCL 50 MG/ML IJ SOLN
25.0000 mg | Freq: Four times a day (QID) | INTRAMUSCULAR | Status: DC | PRN
  Administered 2017-12-18: 25 mg via INTRAVENOUS
  Filled 2017-12-18: qty 1

## 2017-12-18 MED ORDER — DEXAMETHASONE SODIUM PHOSPHATE 10 MG/ML IJ SOLN
5.0000 mg | Freq: Once | INTRAMUSCULAR | Status: AC
  Administered 2017-12-18: 5 mg via INTRAVENOUS
  Filled 2017-12-18: qty 0.5

## 2017-12-18 MED ORDER — CALCIUM CARBONATE ANTACID 500 MG PO CHEW
500.0000 mg | CHEWABLE_TABLET | Freq: Two times a day (BID) | ORAL | Status: DC
  Administered 2017-12-18 – 2017-12-19 (×2): 500 mg via ORAL
  Filled 2017-12-18 (×2): qty 3

## 2017-12-18 MED ORDER — DIPHENHYDRAMINE HCL 50 MG/ML IJ SOLN
12.5000 mg | Freq: Once | INTRAMUSCULAR | Status: AC
  Administered 2017-12-18: 12.5 mg via INTRAVENOUS
  Filled 2017-12-18: qty 1

## 2017-12-18 MED ORDER — DIPHENHYDRAMINE HCL 12.5 MG/5ML PO ELIX
25.0000 mg | ORAL_SOLUTION | Freq: Three times a day (TID) | ORAL | Status: DC | PRN
  Filled 2017-12-18: qty 10

## 2017-12-18 NOTE — Consult Note (Signed)
Reason for Consult: upper and lower extremity cramps  Referring Physician: Dr. Posey Pronto   CC: upper and lower extremity cramps   HPI: Kayla Curry is an 47 y.o. female  history of hypokalemia, history of Gittleman/Bartter's syndrom, hypothyroidism, [name], benign right adrenal adenoma comes to the emergency room with increasing bilateral lower extremity pain and cramping. Patient was found to have hypokalemia, hypomagnesimia, low phosphorus. Similar symptoms few years ago that resolved with benadryl and electrolyte replacement.  She was given morphine after which had to transferred to ICU due to somnolence that improved with Narcan.     Past Medical History:  Diagnosis Date  . Adrenal adenoma, right   . Asthma   . Chronic pain syndrome   . GERD (gastroesophageal reflux disease)   . Gitelman syndrome    DISORDER MAGNESIUM METABOLISM  . History of pelvic fracture   . History of supraventricular tachycardia    PER PT ON 02-28-2013 NO ISSUES SINCE ABLATION IN 2005  . Hypokalemia    CHRONIC  . Hypothyroidism   . 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 Magnesium metabolism disorder NEPHROLOGIST-  DR [name] ([facility] [place])   [place]  . Malfunction of device    INTERSTIM  . Neurogenic bladder   . PONV (postoperative nausea and vomiting)    reports PONV and rash with propofol  . S/P ablation of ventricular arrhythmia HX SVT--  2005   [place]--  LAST VISIT 2012 -- PT RELEASED FROM CARE ON PRN BASIS  . Self-catheterizes urinary bladder   . Urinary retention    RESIDUAL FROM PELVIC FX INJURY---Self cath TID and prn  . Wears contact lenses     Past Surgical History:  Procedure Laterality Date  . APPENDECTOMY  1994   EXPL. LAP.  Marland Kitchen CARDIAC ELECTROPHYSIOLOGY STUDY AND ABLATION  2005- AT [facility]   SVT--  PT STATES NO ISSUES SINCE   . ESOPHAGOGASTRODUODENOSCOPY (EGD) WITH PROPOFOL  N/A 11/02/2017   Procedure: ESOPHAGOGASTRODUODENOSCOPY (EGD) WITH PROPOFOL;  Surgeon: Lucilla Lame, MD;  Location: [place];  Service: Endoscopy;  Laterality: N/A;  . INTERSTIM IMPLANT PLACEMENT  2009[facility]  . INTERSTIM IMPLANT PLACEMENT  10/05/2011   Procedure: Barrie Lyme IMPLANT FIRST STAGE;  Surgeon: [name], MD;  Location: [facility];  Service: Urology;  Laterality: N/A;  . INTERSTIM IMPLANT PLACEMENT  10/05/2011   Procedure: Barrie Lyme IMPLANT SECOND STAGE;  Surgeon: [name], MD;  Location: [facility];  Service: Urology;  Laterality: N/A;  . INTERSTIM IMPLANT PLACEMENT N/A 11/13/2016   Procedure: Barrie Lyme IMPLANT FIRST STAGE;  Surgeon: [name], MD;  Location: [facility];  Service: Urology;  Laterality: N/A;  . INTERSTIM IMPLANT PLACEMENT N/A 11/13/2016   Procedure: Barrie Lyme IMPLANT SECOND STAGE;  Surgeon: [name], MD;  Location: [facility];  Service: Urology;  Laterality: N/A;  . INTERSTIM IMPLANT REMOVAL N/A 11/13/2016   Procedure: REMOVAL OF INTERSTIM IMPLANT ONE AND TWO;  Surgeon: [name], MD;  Location: [facility];  Service: Urology;  Laterality: N/A;  . INTERSTIM IMPLANT REVISION N/A 03/06/2013   Procedure: REVISION OF Barrie Lyme;  Surgeon: [name], MD;  Location: [facility];  Service: Urology;  Laterality: N/A;  . LAPAROSCOPIC CHOLECYSTECTOMY  2001  . TONSILLECTOMY  1998  . VAGINAL HYSTERECTOMY  2003    Family History  Problem  Relation Age of Onset  . Hypertension Father   . Colon cancer Neg Hx     Social History:  reports that she has never smoked. She has never used smokeless tobacco. She reports that she does not drink alcohol or use drugs.  Allergies  Allergen Reactions  . Advair Diskus [Fluticasone-Salmeterol] Shortness Of Breath  . Albuterol Sulfate Shortness Of Breath  . Atrovent Shortness Of  Breath  . Bethanechol Shortness Of Breath, Rash and Other (See Comments)    RESPIRATORY DISTRESS  . Budesonide-Formoterol Fumarate Shortness Of Breath  . Influenza Vac Split Quad Anaphylaxis  . Influenza Vaccines Anaphylaxis and Shortness Of Breath  . Ipratropium Shortness Of Breath  . Ondansetron Rash  . Shellfish Allergy Anaphylaxis and Shortness Of Breath  . Xopenex [Levalbuterol] Shortness Of Breath  . Compazine Other (See Comments)    DYSTONIC  . Fluoride Preparations   . Lorazepam Other (See Comments)    "HAS OPPOSITE EFFECT"  . Levofloxacin Rash  . Other Rash    DYSTONIC  . Potassium-Containing Compounds Nausea And Vomiting    Just the po potassium  No problem with iv  . Propofol Nausea And Vomiting and Rash  . Zofran [Ondansetron Hcl] Rash    Medications: I have reviewed the patient's current medications.  ROS: History obtained from the patient  General ROS: negative for - chills, fatigue, fever, night sweats, weight gain or weight loss Psychological ROS: appears anxious Ophthalmic ROS: negative for - blurry vision, double vision, eye pain or loss of vision ENT ROS: negative for - epistaxis, nasal discharge, oral lesions, sore throat, tinnitus or vertigo Allergy and Immunology ROS: negative for - hives or itchy/watery eyes Hematological and Lymphatic ROS: negative for - bleeding problems, bruising or swollen lymph nodes Endocrine ROS: negative for - galactorrhea, hair pattern changes, polydipsia/polyuria or temperature intolerance Respiratory ROS: negative for - cough, hemoptysis, shortness of breath or wheezing Cardiovascular ROS: negative for - chest pain, dyspnea on exertion, edema or irregular heartbeat Gastrointestinal ROS: negative for - abdominal pain, diarrhea, hematemesis, nausea/vomiting or stool incontinence Genito-Urinary ROS: negative for - dysuria, hematuria, incontinence or urinary frequency/urgency Musculoskeletal ROS: negative for - joint swelling or  muscular weakness Neurological ROS: as noted in HPI Dermatological ROS: negative for rash and skin lesion changes  Physical Examination: Blood pressure 117/74, pulse 86, temperature 98.4 F (36.9 C), temperature source Oral, resp. rate 17, height 5\' 2"  (1.575 m), weight 111 lb (50.3 kg), SpO2 100 %.    Neurological Examination   Mental Status: Alert, oriented, thought content appropriate.  Speech fluent without evidence of aphasia.  Able to follow 3 step commands without difficulty. Cranial Nerves: II: Discs flat bilaterally; Visual fields grossly normal, pupils equal, round, reactive to light and accommodation III,IV, VI: ptosis not present, extra-ocular motions intact bilaterally V,VII: smile symmetric, facial light touch sensation normal bilaterally VIII: hearing normal bilaterally IX,X: gag reflex present XI: bilateral shoulder shrug XII: midline tongue extension Motor: Right : Upper extremity   5/5    Left:     Upper extremity   5/5  Lower extremity   5/5     Lower extremity   5/5 Tone and bulk:normal tone throughout; no atrophy noted Sensory: Pinprick and light touch intact throughout, bilaterally Deep Tendon Reflexes: 1+ and symmetric throughout Plantars: Right: downgoing   Left: downgoing Cerebellar: normal finger-to-nose, normal rapid alternating movements and normal heel-to-shin test Gait: not tested       Laboratory Studies:   Basic Metabolic Panel: Recent Labs  Lab 12/16/17 0606 12/16/17 20-Nov-2207 12/17/17 0656 12/17/17 0753 12/17/17 1409 12/17/17 11-19-2005 12/18/17 0413  NA 141 143 143 142  --   --   --   K <2.0* <2.0* 6.5* 5.4* 4.5 5.2*  --   CL 106 106 109 110  --   --   --   CO2 29 29 28 24   --   --   --   GLUCOSE 95 106* 144* 125*  --   --   --   BUN 11 8 <5* <5*  --   --   --   CREATININE 0.62 0.58 0.67 0.60  --   --   --   CALCIUM 8.7* 8.1* 7.2* 7.3*  --   --   --   MG 1.5* 1.6* 2.6*  --  2.3 2.2 2.3  PHOS <1.0* 2.1* 3.6  --  3.4 2.6 2.8    Liver  Function Tests: No results for input(s): AST, ALT, ALKPHOS, BILITOT, PROT, ALBUMIN in the last 168 hours. No results for input(s): LIPASE, AMYLASE in the last 168 hours. No results for input(s): AMMONIA in the last 168 hours.  CBC: Recent Labs  Lab 12/16/17 0430 12/16/17 11-20-06 12/18/17 0555  WBC 11.4* 16.8* 22.1*  NEUTROABS  --   --  21.2*  HGB 13.1 13.3 12.6  HCT 37.8 38.1 37.6  MCV 89.6 89.0 91.8  PLT 265 279 291    Cardiac Enzymes: Recent Labs  Lab 12/16/17 2206/11/20 12/17/17 0656  CKTOTAL  --  208  CKMB 2.2  --   TROPONINI  --  <0.03    BNP: Invalid input(s): POCBNP  CBG: Recent Labs  Lab 12/16/17 11/20/07 12/16/17 11/20/2206 12/17/17 0644  GLUCAP 83 102* 145*    Microbiology: Results for orders placed or performed during the hospital encounter of 12/16/17  MRSA PCR Screening     Status: None   Collection Time: 12/16/17 10:22 PM  Result Value Ref Range Status   MRSA by PCR NEGATIVE NEGATIVE Final    Comment:        The GeneXpert MRSA Assay (FDA approved for NASAL specimens only), is one component of a comprehensive MRSA colonization surveillance program. It is not intended to diagnose MRSA infection nor to guide or monitor treatment for MRSA infections. Performed at [facility], [address] [place], [place] [address]     Coagulation Studies: Recent Labs    12/16/17 11-20-2206  LABPROT 11.9  INR 0.88    Urinalysis: No results for input(s): COLORURINE, LABSPEC, PHURINE, GLUCOSEU, HGBUR, BILIRUBINUR, KETONESUR, PROTEINUR, UROBILINOGEN, NITRITE, LEUKOCYTESUR in the last 168 hours.  Invalid input(s): APPERANCEUR  Lipid Panel:     Component Value Date/Time   CHOL 138 04/07/2016 0507   TRIG 99 04/07/2016 0507   HDL 60 04/07/2016 0507   CHOLHDL 2.3 04/07/2016 0507   VLDL 20 04/07/2016 0507   LDLCALC 58 04/07/2016 0507    HgbA1C: No results found for: HGBA1C  Urine Drug Screen:  No results found for: LABOPIA, COCAINSCRNUR, LABBENZ, AMPHETMU,  THCU, LABBARB  Alcohol Level: No results for input(s): ETH in the last 168 hours.  Other results: EKG: normal EKG, normal sinus rhythm, unchanged from previous tracings.  Imaging: Dg Chest Port 1 View  Result Date: 12/16/2017 CLINICAL DATA:  Shortness of breath EXAM: PORTABLE CHEST 1 VIEW COMPARISON:  04/06/2016 FINDINGS: Normal heart size and mediastinal contours. No acute infiltrate or edema. No effusion or pneumothorax. No acute osseous findings. Artifact from EKG leads. IMPRESSION: Negative portable  chest Electronically Signed   By: Monte Fantasia M.D.   On: 12/16/2017 21:08     Assessment/Plan:  47 y.o. female  history of hypokalemia, history of Gittleman/Bartter's syndrom, hypothyroidism, [name], benign right adrenal adenoma comes to the emergency room with increasing bilateral lower extremity pain and cramping. Patient was found to have hypokalemia, hypomagnesimia, low phosphorus. Similar symptoms few years ago that resolved with benadryl and electrolyte replacement.  She was given morphine after which had to transferred to ICU due to somnolence that improved with Narcan.    - Much improved.  - minimal spasms now - Will add benadryl 12.5 PRN q 6 for 2 days - Asked for oxycodone and decadron which will hold off at this time. - Observation  - PT -d/c planning 12/18/2017, 11:16 AM

## 2017-12-18 NOTE — Progress Notes (Addendum)
Pt rested comfortably throughout the night protecting airway and no signs of respiratory distress or airway compromise.  Pt remains on RA with O2 sats upper 90's and vss.  Therefore, will transfer pt to [facility] unit with telemetry monitoring. Pts husband spoke with nursing supervisor Malachy Mood stating he no longer wants Intensivist Dr. Cassandria Santee to assist with pts plan of care.   Marda Stalker, [place] Pager [phone] (please enter 7 digits) PCCM Consult Pager [phone] (please enter 7 digits)

## 2017-12-18 NOTE — Progress Notes (Signed)
Patient ID: Kayla Curry, female   DOB: [birth year], 47 y.o.   MRN: [id number] Spoke with icu RN regarding her note about me being notified of potassium of 7.0 I am not aware about discussing with the RN or the pharmacy about it. I called lab to see since not critical value is resulted. I was told that the results are cancelled and they are running another K now.

## 2017-12-18 NOTE — Progress Notes (Signed)
[place] [place] NAME: Kayla Curry    MR#:  [id number]  DATE OF BIRTH:  04-Sep-1970  SUBJECTIVE:  feels and looks a lot better. And cramping improved family in the room REVIEW OF SYSTEMS:   Review of Systems  Constitutional: Negative for chills, fever and weight loss.  HENT: Negative for ear discharge, ear pain and nosebleeds.   Eyes: Negative for blurred vision, pain and discharge.  Respiratory: Negative for sputum production, shortness of breath, wheezing and stridor.   Cardiovascular: Negative for chest pain, palpitations, orthopnea and PND.  Gastrointestinal: Negative for abdominal pain, diarrhea, nausea and vomiting.  Genitourinary: Negative for frequency and urgency.  Musculoskeletal: Positive for back pain and joint pain.  Neurological: Negative for sensory change, speech change, focal weakness and weakness.  Psychiatric/Behavioral: Negative for depression and hallucinations. The patient is not nervous/anxious.    Tolerating Diet:yes Tolerating PT: no PT needs at home DRUG ALLERGIES:   Allergies  Allergen Reactions  . Advair Diskus [Fluticasone-Salmeterol] Shortness Of Breath  . Albuterol Sulfate Shortness Of Breath  . Atrovent Shortness Of Breath  . Bethanechol Shortness Of Breath, Rash and Other (See Comments)    RESPIRATORY DISTRESS  . Budesonide-Formoterol Fumarate Shortness Of Breath  . Influenza Vac Split Quad Anaphylaxis  . Influenza Vaccines Anaphylaxis and Shortness Of Breath  . Ipratropium Shortness Of Breath  . Ondansetron Rash  . Shellfish Allergy Anaphylaxis and Shortness Of Breath  . Xopenex [Levalbuterol] Shortness Of Breath  . Compazine Other (See Comments)    DYSTONIC  . Fluoride Preparations   . Lorazepam Other (See Comments)    "HAS OPPOSITE EFFECT"  . Levofloxacin Rash  . Other Rash    DYSTONIC  . Potassium-Containing Compounds Nausea And Vomiting    Just the po potassium  No problem  with iv  . Propofol Nausea And Vomiting and Rash  . Zofran [Ondansetron Hcl] Rash    VITALS:  Blood pressure 117/74, pulse 87, temperature 98.4 F (36.9 C), temperature source Oral, resp. rate 17, height 5\' 2"  (1.575 m), weight 50.3 kg (111 lb), SpO2 98 %.  PHYSICAL EXAMINATION:   Physical Exam  GENERAL:  47 y.o.-year-old patient lying in the bed with no acute distress. Thin cachectic EYES: Pupils equal, round, reactive to light and accommodation. No scleral icterus. Extraocular muscles intact.  HEENT: Head atraumatic, normocephalic. Oropharynx and nasopharynx clear.  NECK:  Supple, no jugular venous distention. No thyroid enlargement, no tenderness.  LUNGS: Normal breath sounds bilaterally, no wheezing, rales, rhonchi. No use of accessory muscles of respiration.  CARDIOVASCULAR: S1, S2 normal. No murmurs, rubs, or gallops.  ABDOMEN: Soft, nontender, nondistended. Bowel sounds present. No organomegaly or mass.  EXTREMITIES: No cyanosis, clubbing or edema b/l.    NEUROLOGIC: Cranial nerves II through XII are intact. No focal Motor or sensory deficits b/l.   PSYCHIATRIC:  patient is alert and oriented x 3.  SKIN: No obvious rash, lesion, or ulcer.   LABORATORY PANEL:  CBC Recent Labs  Lab 12/18/17 0555  WBC 22.1*  HGB 12.6  HCT 37.6  PLT 291    Chemistries  Recent Labs  Lab 12/18/17 0413  NA 136  K 7.0*  CL 108  CO2 22  GLUCOSE 127*  BUN 6  CREATININE 0.66  CALCIUM 7.8*  MG 2.3   Cardiac Enzymes Recent Labs  Lab 12/17/17 0656  TROPONINI <0.03   RADIOLOGY:  Dg Chest Port 1 View  Result Date: 12/16/2017 CLINICAL DATA:  Shortness of breath EXAM: PORTABLE CHEST 1 VIEW COMPARISON:  04/06/2016 FINDINGS: Normal heart size and mediastinal contours. No acute infiltrate or edema. No effusion or pneumothorax. No acute osseous findings. Artifact from EKG leads. IMPRESSION: Negative portable chest Electronically Signed   By: Monte Fantasia M.D.   On: 12/16/2017 21:08    ASSESSMENT AND PLAN:   Kayla Curry  is a 47 y.o. female with a known history of hypokalemia, history of Gittleman/Bartter's syndrom, hypothyroidism, [name], benign right adrenal adenoma comes to the emergency room with increasing bilateral lower extremity pain and cramping.  1. bilateral lower extremity cramping secondary to severe electrolyte abnormality secondary to Gettleman/Barter syndrome -patient has low phosphorus, magnesium, potassium -pharmacy consult for electrolyte replacement---electrolytes repeated -nephrology consultation-- appreciated -patient now has elevated potassium. Discussed with nephrology will give dextrose with insulin and veltessa -low calcium--replace with po tums   2. Gerd/chronic active gastritis -continue PPI and Carafate -in upper G.I. endoscopy in March 2019  3. Hypothyroidism continue Synthroid  4. Right adrenal adenoma known history. Patient has had extensive workup [facility]  5. Narcotic overuse leading to unresponsiveness. -Patient explained that her electrolytes are repeated she should start feeling better. She needs to start working with physical therapy and do more muscle strengthening exercise to help her with her pain. --I have discontinued IV morphine -will continue with oral narcotics every eight hours as needed along with muscle relaxants.   Discuss with patient and husband  Once electrolytes have stabilized will discharged to home hopefully tomorrow.   Case discussed with Care Management/Social Worker. Management plans discussed with the patient, family and they are in agreement.  CODE STATUS: full  DVT Prophylaxis: lovenox  TOTAL TIME TAKING CARE OF THIS PATIENT: 40 minutes.  >50% time spent on counselling and coordination of care  POSSIBLE D/C IN 1-2 DAYS, DEPENDING ON CLINICAL CONDITION.  Note: This dictation was prepared with Dragon dictation along with smaller phrase technology. Any transcriptional errors that  result from this process are unintentional.  Fritzi Mandes M.D on 12/18/2017 at 2:45 PM  Between 7am to 6pm - Pager - [phone]  After 6pm go to [email or web address] - password [email or web address] [facility]  Office  [phone]  CC: Primary care physician; [name], MDPatient ID: [name], female   DOB: 15-Apr-1971, 47 y.o.   MRN: [id number]

## 2017-12-18 NOTE — Progress Notes (Signed)
Report given to [place], RN for pt to be transferred out to room 217. pt husband present and will take pt belonging to new room.

## 2017-12-18 NOTE — Progress Notes (Signed)
Per verbal order from Dr. Soyla Murphy give patient 10 mg of geodon IM. Patient crying and screaming out from room d/t muscle pain and tingling without relief from pain medication. Patient stated that she wanted to try benadryl before geodon, stating that she had a reaction to a medication before and benadryl helped the tingling/cramping in her hands/fingers. Benadryl given with short relief, patient then agreed to geodon around 1200 with family present. Geodon given, patient has been calm with only complaints of mild tingling and cramping in fingers and hands. Wilnette Kales

## 2017-12-18 NOTE — Progress Notes (Signed)
CRITICAL VALUE ALERT  Critical Value:  7.0 potassium Date & Time Notied:  0800 12/18/17  Provider Notified: MD S.Patel   Orders Received/Actions taken: no new orders at this time. Spironolactone held per order parameters.

## 2017-12-18 NOTE — Evaluation (Signed)
Physical Therapy Evaluation Patient Details Name: Kayla Curry MRN: [id number] DOB: 10-13-1970 Today's Date: 12/18/2017   History of Present Illness  Pt is a 47 yo F with h/o hypokalemia, Gittleman/Bartter'ssyndrom,hypothyroidism, [name], benign right adrenal adenoma comes to the emergency room with increasing bilateral lower extremity pain and cramping. Patient was found to have hypokalemia, hypomagnesimia, low phosphorus. Similar symptoms few years ago that resolved with benadryl and electrolyte replacement.  She was given morphine after which had to transferred to ICU due to somnolence that improved with Narcan.      Clinical Impression  Pt was Ind with bed mobility, transfers, and amb without an AD with good stability.  Pt steady during higher level balance tasks such as static standing with feet together, eyes closed, and head turns.  No deficits in strength or sensation noted.  Pt's HR and SpO2 were WNL during session.  Pt only c/o pain to B hands that she states is secondary to an allergic reaction to medication.  Pt reports no PT needs at this time and feels she is near baseline physically.  Will complete PT orders at this time but will reassess pt pending a change in status upon receipt of new PT orders.      Follow Up Recommendations No PT follow up    Equipment Recommendations  None recommended by PT    Recommendations for Other Services       Precautions / Restrictions Precautions Precautions: Fall Restrictions Weight Bearing Restrictions: No      Mobility  Bed Mobility Overal bed mobility: Independent                Transfers Overall transfer level: Independent                  Ambulation/Gait Ambulation/Gait assistance: Independent Ambulation Distance (Feet): 220 Feet Assistive device: None Gait Pattern/deviations: WFL(Within Functional Limits)   Gait velocity interpretation: 1.31 - 2.62 ft/sec, indicative of limited community Chartered certified accountant [employer] (Stroke Patients Only)       Balance Overall balance assessment: No apparent balance deficits (not formally assessed)                                           Pertinent Vitals/Pain Pain Assessment: 0-10 Pain Score: 7  Pain Location: B hands Pain Descriptors / Indicators: Aching;Sore Pain Intervention(s): Premedicated before session;Monitored during session    [place] expects to be discharged to:: Private residence Living Arrangements: Spouse/significant other Available Help at Discharge: Family;Available PRN/intermittently Type of Home: House Home Access: Ramped entrance     Home Layout: One level Home Equipment: Walker - 2 wheels;Cane - single point;Wheelchair - manual      Prior Function Level of Independence: Independent         Comments: Pt is a Therapist, 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, 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, active, no fall history, Ind with ADLs     Hand Dominance   Dominant Hand: Right    Extremity/Trunk Assessment   Upper Extremity Assessment Upper Extremity Assessment: Overall WFL for tasks assessed    Lower Extremity Assessment Lower Extremity Assessment: Overall WFL for tasks assessed       Communication   Communication: No difficulties  Cognition Arousal/Alertness: Awake/alert Behavior During Therapy: WFL for tasks  assessed/performed Overall Cognitive Status: Within Functional Limits for tasks assessed                                        General Comments      Exercises     Assessment/Plan    PT Assessment Patent does not need any further PT services  PT Problem List         PT Treatment Interventions      PT Goals (Current goals can be found in the Care Plan section)  Acute Rehab PT Goals Patient Stated Goal: To be able to take a hot shower PT Goal Formulation: With patient Time For Goal Achievement:  12/31/17 Potential to Achieve Goals: Good    Frequency     Barriers to discharge        Co-evaluation               AM-PAC PT "6 Clicks" Daily Activity  Outcome Measure Difficulty turning over in bed (including adjusting bedclothes, sheets and blankets)?: None Difficulty moving from lying on back to sitting on the side of the bed? : None Difficulty sitting down on and standing up from a chair with arms (e.g., wheelchair, bedside commode, etc,.)?: None Help needed moving to and from a bed to chair (including a wheelchair)?: None Help needed walking in hospital room?: None Help needed climbing 3-5 steps with a railing? : None 6 Click Score: 24    End of Session Equipment Utilized During Treatment: Gait belt Activity Tolerance: Patient tolerated treatment well Patient left: in bed;with bed alarm set;with family/visitor present Nurse Communication: Mobility status PT Visit Diagnosis: Muscle weakness (generalized) (M62.81)    Time: 5784-6962 PT Time Calculation (min) (ACUTE ONLY): 41 min   Charges:   PT Evaluation $PT Eval Low Complexity: 1 Low     PT G Codes:        DRoyetta Asal PT, DPT 12/18/17, 11:58 AM

## 2017-12-18 NOTE — Progress Notes (Signed)
Patient ID: Kayla Curry, female   DOB: 06/21/1971, 47 y.o.   MRN: [id number]   Lab called after verifying that K is 7.0 Will give dextrose and insulin and veltassa

## 2017-12-19 ENCOUNTER — Encounter: Payer: Self-pay | Admitting: Family Medicine

## 2017-12-19 DIAGNOSIS — E876 Hypokalemia: Secondary | ICD-10-CM

## 2017-12-19 LAB — BASIC METABOLIC PANEL
Anion gap: 7 (ref 5–15)
BUN: 9 mg/dL (ref 6–20)
CALCIUM: 8.6 mg/dL — AB (ref 8.9–10.3)
CO2: 25 mmol/L (ref 22–32)
CREATININE: 0.73 mg/dL (ref 0.44–1.00)
Chloride: 106 mmol/L (ref 101–111)
GFR calc non Af Amer: >60 mL/min (ref >60)
Glucose, Bld: 129 mg/dL — ABNORMAL HIGH (ref 65–99)
Potassium: 5.1 mmol/L (ref 3.5–5.1)
SODIUM: 138 mmol/L (ref 135–145)

## 2017-12-19 LAB — MAGNESIUM: Magnesium: 1.8 mg/dL (ref 1.7–2.4)

## 2017-12-19 LAB — PHOSPHORUS: Phosphorus: 3.1 mg/dL (ref 2.5–4.6)

## 2017-12-19 MED ORDER — CALCIUM CARBONATE ANTACID 500 MG PO CHEW: 500.0000 mg | CHEWABLE_TABLET | Freq: Two times a day (BID) | ORAL | 1 refills | Status: DC

## 2017-12-19 MED ORDER — MAGNESIUM OXIDE 400 (241.3 MG) MG PO TABS
400.0000 mg | ORAL_TABLET | Freq: Two times a day (BID) | ORAL | Status: DC
  Administered 2017-12-19: 400 mg via ORAL
  Filled 2017-12-19: qty 1

## 2017-12-19 MED ORDER — MAGNESIUM OXIDE 400 (241.3 MG) MG PO TABS: 400.0000 mg | ORAL_TABLET | Freq: Two times a day (BID) | ORAL | 0 refills | Status: AC

## 2017-12-19 NOTE — Discharge Summary (Signed)
[facility] NAME: Kayla Curry    MR#:  [id number]  DATE OF BIRTH:  1971-03-12  DATE OF ADMISSION:  12/16/2017 ADMITTING PHYSICIAN: Fritzi Mandes, MD  DATE OF DISCHARGE: 12/19/2017  PRIMARY CARE PHYSICIAN: [name], MD    ADMISSION DIAGNOSIS:  Hypokalemia [E87.6] Hypomagnesemia [E83.42] Hypokalemic periodic paralysis [G72.3] Hypophosphatemia [E83.39] Pain in both lower extremities [M79.604, M79.605]  DISCHARGE DIAGNOSIS:  Electrolyte Abnormality with low K,Mag,Ca and phosphorus---now corrected  SECONDARY DIAGNOSIS:   Past Medical History:  Diagnosis Date  . Adrenal adenoma, right   . Asthma   . Chronic pain syndrome   . GERD (gastroesophageal reflux disease)   . Gitelman syndrome    DISORDER MAGNESIUM METABOLISM  . History of pelvic fracture   . History of supraventricular tachycardia    PER PT ON 02-28-2013 NO ISSUES SINCE ABLATION IN 2005  . Hypokalemia    CHRONIC  . Hypothyroidism   . 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 Magnesium metabolism disorder NEPHROLOGIST-  DR [name] ([facility] [place])   [place]  . Malfunction of device    INTERSTIM  . Neurogenic bladder   . PONV (postoperative nausea and vomiting)    reports PONV and rash with propofol  . S/P ablation of ventricular arrhythmia HX SVT--  2005   Peter--  LAST VISIT 2012 -- PT RELEASED FROM CARE ON PRN BASIS  . Self-catheterizes urinary bladder   . Urinary retention    RESIDUAL FROM PELVIC FX INJURY---Self cath TID and prn  . Wears contact lenses     HOSPITAL COURSE:   SusanFortneris a46 y.o.femalewith a known history of hypokalemia, history of Gittleman/Bartter'ssyndrom,hypothyroidism, Kayla Curry, benign right adrenal adenoma comes to the emergency room with increasing bilateral lower extremity pain and  cramping.  1.Electrolyte Abnormality with low K,MAG,CA,PHOSP -presented bilateral lower extremity cramping secondary to severe electrolyte abnormality  -now corrected Resume aldactone, po K,mag and calcium -pt advised to get electrolytes checked next week with Dr Caryn Section  2.Gerd/chronic active gastritis -continue PPI and Carafate -in upper G.I. endoscopy in March 2019  3.Hypothyroidism continue Synthroid  4.Right adrenal adenoma known history. Patient has had extensive workup done at [facility]  Overall better D/c home  CONSULTS OBTAINED:  Treatment Team:  Clapacs, Madie Reno, MD Leotis Pain, MD  DRUG ALLERGIES:   Allergies  Allergen Reactions  . Advair Diskus [Fluticasone-Salmeterol] Shortness Of Breath  . Albuterol Sulfate Shortness Of Breath  . Atrovent Shortness Of Breath  . Bethanechol Shortness Of Breath, Rash and Other (See Comments)    RESPIRATORY DISTRESS  . Budesonide-Formoterol Fumarate Shortness Of Breath  . Influenza Vac Split Quad Anaphylaxis  . Influenza Vaccines Anaphylaxis and Shortness Of Breath  . Ipratropium Shortness Of Breath  . Ondansetron Rash  . Shellfish Allergy Anaphylaxis and Shortness Of Breath  . Xopenex [Levalbuterol] Shortness Of Breath  . Compazine Other (See Comments)    DYSTONIC  . Fluoride Preparations   . Lorazepam Other (See Comments)    "HAS OPPOSITE EFFECT"  . Levofloxacin Rash  . Other Rash    DYSTONIC  . Potassium-Containing Compounds Nausea And Vomiting    Just the po potassium  No problem with iv  . Propofol Nausea And Vomiting and Rash  . Zofran [Ondansetron Hcl] Rash    DISCHARGE MEDICATIONS:   Allergies as of 12/19/2017  Reactions   Advair Diskus [fluticasone-salmeterol] Shortness Of Breath   Albuterol Sulfate Shortness Of Breath   Atrovent Shortness Of Breath   Bethanechol Shortness Of Breath, Rash, Other (See Comments)   RESPIRATORY DISTRESS   Budesonide-formoterol Fumarate Shortness Of Breath    Influenza Vac Split Quad Anaphylaxis   Influenza Vaccines Anaphylaxis, Shortness Of Breath   Ipratropium Shortness Of Breath   Ondansetron Rash   Shellfish Allergy Anaphylaxis, Shortness Of Breath   Xopenex [levalbuterol] Shortness Of Breath   Compazine Other (See Comments)   DYSTONIC   Fluoride Preparations    Lorazepam Other (See Comments)   "HAS OPPOSITE EFFECT"   Levofloxacin Rash   Other Rash   DYSTONIC   Potassium-containing Compounds Nausea And Vomiting   Just the po potassium  No problem with iv   Propofol Nausea And Vomiting, Rash   Zofran [ondansetron Hcl] Rash      Medication List    STOP taking these medications   azithromycin 250 MG tablet Commonly known as:  ZITHROMAX   Potassium 99 MG Tabs   predniSONE 5 MG tablet Commonly known as:  DELTASONE   sucralfate 1 GM/10ML suspension Commonly known as:  CARAFATE     TAKE these medications   baclofen 20 MG tablet Commonly known as:  LIORESAL TAKE 1 TABLET (20 MG TOTAL) BY MOUTH 4 (FOUR) TIMES DAILY.   calcium carbonate 500 MG chewable tablet Commonly known as:  TUMS - dosed in mg elemental calcium Chew 2.5 tablets (500 mg of elemental calcium total) by mouth 2 (two) times daily with a meal.   EPIPEN 2-PAK 0.3 mg/0.3 mL Soaj injection Generic drug:  EPINEPHrine Inject into the muscle.   esomeprazole 40 MG capsule Commonly known as:  NEXIUM Take 1 capsule (40 mg total) by mouth daily before breakfast.   gabapentin 600 MG tablet Commonly known as:  NEURONTIN TAKE 1 TABLET (600 MG TOTAL) BY MOUTH 3 (THREE) TIMES DAILY.   magnesium oxide 400 (241.3 Mg) MG tablet Commonly known as:  MAG-OX Take 1 tablet (400 mg total) by mouth 2 (two) times daily.   metaproterenol 20 MG tablet Commonly known as:  ALUPENT Take 1 tablet (20 mg total) by mouth 3 (three) times daily as needed.   oxyCODONE-acetaminophen 7.5-325 MG tablet Commonly known as:  PERCOCET One tablet every 4-5 hours as needed   potassium  chloride 20 MEQ/15ML (10%) Soln Take 7.5 mLs (10 mEq total) by mouth daily for 30 doses.   promethazine 25 MG tablet Commonly known as:  PHENERGAN Take 1 tablet (25 mg total) by mouth every 6 (six) hours as needed for nausea.   spironolactone 100 MG tablet Commonly known as:  ALDACTONE TAKE 1 TABLET (100 MG TOTAL) BY MOUTH 2 (TWO) TIMES DAILY.   SYNTHROID 75 MCG tablet Generic drug:  levothyroxine TAKE 1 TABLET BY MOUTH DAILY. PLEASE SCHEDULE OFFICE VISIT FOR FOLLOWUP   tiZANidine 4 MG tablet Commonly known as:  ZANAFLEX TAKE 1 TABLET (4 MG TOTAL) BY MOUTH 2 (TWO) TIMES DAILY.       If you experience worsening of your admission symptoms, develop shortness of breath, life threatening emergency, suicidal or homicidal thoughts you must seek medical attention immediately by calling 911 or calling your MD immediately  if symptoms less severe.  You Must read complete instructions/literature along with all the possible adverse reactions/side effects for all the Medicines you take and that have been prescribed to you. Take any new Medicines after you have completely understood and accept  all the possible adverse reactions/side effects.   Please note  You were cared for by a hospitalist during your hospital stay. If you have any questions about your discharge medications or the care you received while you were in the hospital after you are discharged, you can call the unit and asked to speak with the hospitalist on call if the hospitalist that took care of you is not available. Once you are discharged, your primary care physician will handle any further medical issues. Please note that NO REFILLS for any discharge medications will be authorized once you are discharged, as it is imperative that you return to your primary care physician (or establish a relationship with a primary care physician if you do not have one) for your aftercare needs so that they can reassess your need for medications and  monitor your lab values. Today   SUBJECTIVE   Doing well  VITAL SIGNS:  Blood pressure 108/65, pulse 70, temperature 98.1 F (36.7 C), temperature source Oral, resp. rate 20, height 5\' 2"  (1.575 m), weight 50.3 kg (111 lb), SpO2 99 %.  I/O:    Intake/Output Summary (Last 24 hours) at 12/19/2017 1008 Last data filed at 12/19/2017 0900 Gross per 24 hour  Intake 358 ml  Output 1300 ml  Net -942 ml    PHYSICAL EXAMINATION:  GENERAL:  47 y.o.-year-old patient lying in the bed with no acute distress.  EYES: Pupils equal, round, reactive to light and accommodation. No scleral icterus. Extraocular muscles intact.  HEENT: Head atraumatic, normocephalic. Oropharynx and nasopharynx clear.  NECK:  Supple, no jugular venous distention. No thyroid enlargement, no tenderness.  LUNGS: Normal breath sounds bilaterally, no wheezing, rales,rhonchi or crepitation. No use of accessory muscles of respiration.  CARDIOVASCULAR: S1, S2 normal. No murmurs, rubs, or gallops.  ABDOMEN: Soft, non-tender, non-distended. Bowel sounds present. No organomegaly or mass.  EXTREMITIES: No pedal edema, cyanosis, or clubbing.  NEUROLOGIC: Cranial nerves II through XII are intact. Muscle strength 5/5 in all extremities. Sensation intact. Gait not checked.  PSYCHIATRIC: The patient is alert and oriented x 3.  SKIN: No obvious rash, lesion, or ulcer.   DATA REVIEW:   CBC  Recent Labs  Lab 12/18/17 0555  WBC 22.1*  HGB 12.6  HCT 37.6  PLT 291    Chemistries  Recent Labs  Lab 12/19/17 0527  NA 138  K 5.1  CL 106  CO2 25  GLUCOSE 129*  BUN 9  CREATININE 0.73  CALCIUM 8.6*  MG 1.8    Microbiology Results   Recent Results (from the past 240 hour(s))  MRSA PCR Screening     Status: None   Collection Time: 12/16/17 10:22 PM  Result Value Ref Range Status   MRSA by PCR NEGATIVE NEGATIVE Final    Comment:        The GeneXpert MRSA Assay (FDA approved for NASAL specimens only), is one component of  a comprehensive MRSA colonization surveillance program. It is not intended to diagnose MRSA infection nor to guide or monitor treatment for MRSA infections. Performed at [facility], [address] [place], [place] [address]     RADIOLOGY:  No results found.   Management plans discussed with the patient, family and they are in agreement.  CODE STATUS:     Code Status Orders  (From admission, onward)        Start     Ordered   12/16/17 0912  Full code  Continuous     12/16/17  3559    Code Status History    Date Active Date Inactive Code Status Order ID Comments User Context   04/06/2016 1649 04/07/2016 0759 Full Code [id number]  Nicholes Mango, MD Inpatient      TOTAL TIME TAKING CARE OF THIS PATIENT: 40 minutes.    Fritzi Mandes M.D on 12/19/2017 at 10:08 AM  Between 7am to 6pm - Pager - [phone] After 6pm go to [email or web address] - password [email or web address]  Office  [phone]  CC: Primary care physician; [name], MD

## 2017-12-19 NOTE — Discharge Instructions (Addendum)
Pt advised to BMP and mag/phos checked next week

## 2017-12-19 NOTE — Consult Note (Signed)
PHARMACY CONSULT NOTE - INITIAL   Pharmacy Consult for Electrolyte Monitoring and Replacement   Labs: Recent Labs    12/16/17 2208  12/17/17 0753  12/17/17 2007 12/18/17 0413 12/18/17 0555 12/19/17 0527  WBC 16.8*  --   --   --   --   --  22.1*  --   HGB 13.3  --   --   --   --   --  12.6  --   HCT 38.1  --   --   --   --   --  37.6  --   PLT 279  --   --   --   --   --  291  --   APTT 25  --   --   --   --   --   --   --   CREATININE  --    < > 0.60  --   --  0.66  --  0.73  MG  --    < >  --    < > 2.2 2.3  --  1.8  PHOS  --    < >  --    < > 2.6 2.8  --  3.1   < > = values in this interval not displayed.   Potassium (mmol/L)  Date Value  12/19/2017 5.1  01/31/2013 4.1   Magnesium (mg/dL)  Date Value  12/19/2017 1.8   Calcium (mg/dL)  Date Value  12/19/2017 8.6 (L)   Calcium, Total (mg/dL)  Date Value  01/31/2013 8.8   Albumin (g/dL)  Date Value  06/14/2016 4.8  01/31/2013 4.1   Phosphorus (mg/dL)  Date Value  12/19/2017 3.1  ] Estimated Creatinine Clearance: 70.2 mL/min (by C-G formula based on SCr of 0.73 mg/dL).   Assessment: Pharmacy consulted for electrolyte monitoring and replacement in 47 yo female with hypokalemia, hypophosphatemia, and hypomagnesemia.   Goal of Therapy:  Electrolytes WNL  Plan:  4/29 K+ and Mg were supra therapeutic this morning. Now  Mag, K+ and Phos are therapeutic. No supplementation needed at this time.   Will recheck electrolytes with AM labs and continue to replace as needed.   5/1 05:27 K 5.1, Mg 1.8, phos 3.1, Ca 8.6. Patient had insulin 10 units IV x 1 and dextrose 25 gm IV x 1 with Veltassa po x 1 yesterday for K 7. This morning K is much improved. Continue current calcium supplement, will also start magnesium oxide 400 mg po BID. Recheck all electrolytes tomorrow with AM labs.  Laural Benes, PharmD, BCPS Clinical Pharmacist 12/19/2017 7:21 AM

## 2017-12-24 ENCOUNTER — Other Ambulatory Visit: Payer: Self-pay | Admitting: Family Medicine

## 2017-12-24 MED ORDER — OXYCODONE-ACETAMINOPHEN 7.5-325 MG PO TABS: ORAL_TABLET | ORAL | 0 refills | Status: DC

## 2017-12-24 NOTE — Telephone Encounter (Signed)
Patient needs Oxycodone 7.5-325 mg. Sent to [facility] in [place]

## 2017-12-25 ENCOUNTER — Telehealth: Payer: Self-pay | Admitting: Family Medicine

## 2017-12-25 NOTE — Telephone Encounter (Signed)
Left message to call back  

## 2017-12-25 NOTE — Telephone Encounter (Signed)
Pt was admitted to the hospital last week and was released May 1st.. She has an appt Friday with you.  She has tried to return to work but her employer says she needs a note to be able to return to work. She worked yesterday.   She wants to know if you will write her a note so she can stay at work.  She needs the note asap.  Pt's call back is [phone]  Thanks teri

## 2017-12-25 NOTE — Telephone Encounter (Signed)
Needs ov. And labs before deciding if she can return to work.

## 2017-12-25 NOTE — Telephone Encounter (Signed)
Please review. Thanks!  

## 2017-12-26 ENCOUNTER — Telehealth: Payer: Self-pay | Admitting: Family Medicine

## 2017-12-26 DIAGNOSIS — R531 Weakness: Secondary | ICD-10-CM | POA: Diagnosis not present

## 2017-12-26 NOTE — Telephone Encounter (Signed)
Patient has an appt scheduled for 12/28/2017.

## 2017-12-26 NOTE — Telephone Encounter (Signed)
Patient was advised. Lab slip printed.  

## 2017-12-26 NOTE — Telephone Encounter (Signed)
Pt has an appt Friday for HFU for low potasium and she states her legs are weak.  She wants to know if she should come in today for labwork before her appt on Friday  Her call back is [phone]  Thanks teri

## 2017-12-26 NOTE — Telephone Encounter (Signed)
She can get labs drawn today. Please print pended order and leave at lab.

## 2017-12-26 NOTE — Telephone Encounter (Signed)
Please advise 

## 2017-12-27 ENCOUNTER — Telehealth: Payer: Self-pay | Admitting: Emergency Medicine

## 2017-12-27 LAB — RENAL FUNCTION PANEL
ALBUMIN: 4.2 g/dL (ref 3.5–5.5)
BUN/Creatinine Ratio: 13 (ref 9–23)
BUN: 10 mg/dL (ref 6–24)
CO2: 26 mmol/L (ref 20–29)
Calcium: 10.2 mg/dL (ref 8.7–10.2)
Chloride: 96 mmol/L (ref 96–106)
Creatinine, Ser: 0.8 mg/dL (ref 0.57–1.00)
GFR, EST AFRICAN AMERICAN: 103 mL/min/{1.73_m2} (ref >59)
GFR, EST NON AFRICAN AMERICAN: 89 mL/min/{1.73_m2} (ref >59)
Glucose: 63 mg/dL — ABNORMAL LOW (ref 65–99)
Phosphorus: 5.6 mg/dL — ABNORMAL HIGH (ref 2.5–4.5)
Potassium: 4.1 mmol/L (ref 3.5–5.2)
Sodium: 142 mmol/L (ref 134–144)

## 2017-12-27 LAB — CBC
HEMATOCRIT: 44.6 % (ref 34.0–46.6)
HEMOGLOBIN: 14.2 g/dL (ref 11.1–15.9)
MCH: 29.7 pg (ref 26.6–33.0)
MCHC: 31.8 g/dL (ref 31.5–35.7)
MCV: 93 fL (ref 79–97)
Platelets: 433 10*3/uL — ABNORMAL HIGH (ref 150–379)
RBC: 4.78 x10E6/uL (ref 3.77–5.28)
RDW: 13 % (ref 12.3–15.4)
WBC: 12.8 10*3/uL — ABNORMAL HIGH (ref 3.4–10.8)

## 2017-12-27 NOTE — Telephone Encounter (Signed)
Pt requesting lab results particularly potassium level. Please advise.

## 2017-12-27 NOTE — Telephone Encounter (Signed)
Advised patient of results.  

## 2017-12-27 NOTE — Telephone Encounter (Signed)
Blood sugar slightly low. White blood cell count is high, but getting better. Potassium level and other electrolytes are normal.

## 2017-12-27 NOTE — Telephone Encounter (Signed)
Please advise results? 

## 2017-12-28 ENCOUNTER — Encounter: Payer: Self-pay | Admitting: Family Medicine

## 2017-12-28 ENCOUNTER — Ambulatory Visit (INDEPENDENT_AMBULATORY_CARE_PROVIDER_SITE_OTHER): Payer: BLUE CROSS/BLUE SHIELD | Admitting: Family Medicine

## 2017-12-28 VITALS — BP 102/58 | HR 78 | Temp 97.8°F | Resp 18 | Wt 111.0 lb

## 2017-12-28 DIAGNOSIS — R112 Nausea with vomiting, unspecified: Secondary | ICD-10-CM | POA: Diagnosis not present

## 2017-12-28 DIAGNOSIS — K219 Gastro-esophageal reflux disease without esophagitis: Secondary | ICD-10-CM | POA: Diagnosis not present

## 2017-12-28 DIAGNOSIS — G894 Chronic pain syndrome: Secondary | ICD-10-CM | POA: Diagnosis not present

## 2017-12-28 DIAGNOSIS — N158 Other specified renal tubulo-interstitial diseases: Secondary | ICD-10-CM

## 2017-12-28 DIAGNOSIS — E876 Hypokalemia: Secondary | ICD-10-CM

## 2017-12-28 DIAGNOSIS — J069 Acute upper respiratory infection, unspecified: Secondary | ICD-10-CM

## 2017-12-28 MED ORDER — AMITRIPTYLINE HCL 25 MG PO TABS: 25.0000 mg | ORAL_TABLET | Freq: Every day | ORAL | 1 refills | Status: DC

## 2017-12-28 MED ORDER — AMOXICILLIN 500 MG PO CAPS: 1000.0000 mg | ORAL_CAPSULE | Freq: Two times a day (BID) | ORAL | 0 refills | Status: AC

## 2017-12-28 MED ORDER — ESOMEPRAZOLE MAGNESIUM 40 MG PO CPDR: 40.0000 mg | DELAYED_RELEASE_CAPSULE | Freq: Every day | ORAL | 11 refills | Status: DC

## 2017-12-28 MED ORDER — OXYCODONE-ACETAMINOPHEN 7.5-325 MG PO TABS: ORAL_TABLET | ORAL | 0 refills | Status: DC

## 2017-12-28 NOTE — Progress Notes (Signed)
Patient: Kayla Curry Female    DOB: August 01, 1971   47 y.o.   MRN: [id number] Visit Date: 12/28/2017  Today's Provider: Lelon Huh, MD   Chief Complaint  Patient presents with  . Hospitalization Follow-up   Subjective:    HPI   Follow up Hospitalization  Patient was admitted to [facility] on 12/16/2017 and discharged on 12/19/2017. She was treated for Hypokalemic, Hypomagnesia, Hypophosphatemia, with initial potassium <2.0. She has long history of hypokalemia attributed to Gitelman's syndrome Treatment for this included; patient was to continue aldactone po. K+, magnesium and calcium. Recheck labs in 1 week. Labs were rechecked showing blood sugar slightly low. WBC count high but better. K+ and other electrolytes normal. Telephone follow up was done on 12/25/2017 She reports good compliance with treatment. She reports this condition is Improved. Pt reports that she still has not gotten her strength back and she still having some chest tightness, sore throat and fever at night. She is not coughing much like she was in the hospital. She saw Simona Huh on 12/14/17 and given z-pak and prednisone for this. She is still having sore throat and chest congestion. She is also still having problems with thumbs with numbness and tingling in her thumbs.    She states she has been taking liquid potassium but has to take with promethazine due to nausea. She had been on 200mg  of spironolactone a day before admission, but has not resumed since her blood pressure has been low.   BMP Latest Ref Rng & Units 12/26/2017 12/19/2017 12/18/2017  Glucose 65 - 99 mg/dL 63(L) 129(H) -  BUN 6 - 24 mg/dL 10 9 -  Creatinine 0.57 - 1.00 mg/dL 0.80 0.73 -  BUN/Creat Ratio 9 - 23 13 - -  Sodium 134 - 144 mmol/L 142 138 -  Potassium 3.5 - 5.2 mmol/L 4.1 5.1 4.8  Chloride 96 - 106 mmol/L 96 106 -  CO2 20 - 29 mmol/L 26 25 -  Calcium 8.7 - 10.2 mg/dL 10.2 8.6(L) -   She also has long history of neuropathy and has been  stable on gabapentin, but states her insurance no longer covers it. She requests chang to amitriptyline.      Allergies  Allergen Reactions  . Advair Diskus [Fluticasone-Salmeterol] Shortness Of Breath  . Albuterol Sulfate Shortness Of Breath  . Atrovent Shortness Of Breath  . Bethanechol Shortness Of Breath, Rash and Other (See Comments)    RESPIRATORY DISTRESS  . Budesonide-Formoterol Fumarate Shortness Of Breath  . Influenza Vac Split Quad Anaphylaxis  . Influenza Vaccines Anaphylaxis and Shortness Of Breath  . Ipratropium Shortness Of Breath  . Ondansetron Rash  . Shellfish Allergy Anaphylaxis and Shortness Of Breath  . Xopenex [Levalbuterol] Shortness Of Breath  . Compazine Other (See Comments)    DYSTONIC  . Fluoride Preparations   . Lorazepam Other (See Comments)    "HAS OPPOSITE EFFECT"  . Levofloxacin Rash  . Other Rash    DYSTONIC  . Potassium-Containing Compounds Nausea And Vomiting    Just the po potassium  No problem with iv  . Propofol Nausea And Vomiting and Rash  . Zofran [Ondansetron Hcl] Rash     Current Outpatient Medications:  .  baclofen (LIORESAL) 20 MG tablet, TAKE 1 TABLET (20 MG TOTAL) BY MOUTH 4 (FOUR) TIMES DAILY., Disp: 120 tablet, Rfl: 5 .  calcium carbonate (TUMS - DOSED IN MG ELEMENTAL CALCIUM) 500 MG chewable tablet, Chew 2.5 tablets (500 mg of elemental  calcium total) by mouth 2 (two) times daily with a meal., Disp: 60 tablet, Rfl: 1 .  EPINEPHrine (EPIPEN 2-PAK) 0.3 mg/0.3 mL IJ SOAJ injection, Inject into the muscle., Disp: , Rfl:  .  esomeprazole (NEXIUM) 40 MG capsule, Take 1 capsule (40 mg total) by mouth daily before breakfast., Disp: 30 capsule, Rfl: 11 .  gabapentin (NEURONTIN) 600 MG tablet, TAKE 1 TABLET (600 MG TOTAL) BY MOUTH 3 (THREE) TIMES DAILY., Disp: 90 tablet, Rfl: 1 .  magnesium oxide (MAG-OX) 400 (241.3 Mg) MG tablet, Take 1 tablet (400 mg total) by mouth 2 (two) times daily., Disp: 60 tablet, Rfl: 0 .   oxyCODONE-acetaminophen (PERCOCET) 7.5-325 MG tablet, One tablet every 4-5 hours as needed, Disp: 120 tablet, Rfl: 0 .  potassium chloride 20 MEQ/15ML (10%) SOLN, Take 7.5 mLs (10 mEq total) by mouth daily for 30 doses., Disp: 225 mL, Rfl: 0 .  promethazine (PHENERGAN) 25 MG tablet, Take 1 tablet (25 mg total) by mouth every 6 (six) hours as needed for nausea., Disp: 60 tablet, Rfl: 5 .  SYNTHROID 75 MCG tablet, TAKE 1 TABLET BY MOUTH DAILY. PLEASE SCHEDULE OFFICE VISIT FOR FOLLOWUP, Disp: 30 tablet, Rfl: 11 .  tiZANidine (ZANAFLEX) 4 MG tablet, TAKE 1 TABLET (4 MG TOTAL) BY MOUTH 2 (TWO) TIMES DAILY., Disp: 60 tablet, Rfl: 4 .  metaproterenol (ALUPENT) 20 MG tablet, Take 1 tablet (20 mg total) by mouth 3 (three) times daily as needed., Disp: 60 tablet, Rfl: 3 .  spironolactone (ALDACTONE) 100 MG tablet, TAKE 1 TABLET (100 MG TOTAL) BY MOUTH 2 (TWO) TIMES DAILY. (Patient not taking: Reported on 12/28/2017), Disp: 60 tablet, Rfl: 5  Review of Systems  Constitutional: Positive for chills, fatigue and fever. Negative for appetite change.  HENT: Positive for sore throat and voice change.   Respiratory: Positive for chest tightness. Negative for shortness of breath.   Cardiovascular: Negative for chest pain and palpitations.  Gastrointestinal: Negative for abdominal pain, nausea and vomiting.  Neurological: Negative for dizziness and weakness.    Social History   Tobacco Use  . Smoking status: Never Smoker  . Smokeless tobacco: Never Used  Substance Use Topics  . Alcohol use: No   Objective:   BP (!) 102/58 (BP Location: Left Arm, Patient Position: Sitting, Cuff Size: Normal)   Pulse 78   Temp 97.8 F (36.6 C) (Oral)   Resp 18   Wt 111 lb (50.3 kg)   SpO2 99%   BMI 20.30 kg/m  Vitals:   12/28/17 1046  BP: (!) 102/58  Pulse: 78  Resp: 18  Temp: 97.8 F (36.6 C)  TempSrc: Oral  SpO2: 99%  Weight: 111 lb (50.3 kg)     Physical Exam   General Appearance:    Alert,  cooperative, no distress  Eyes:    PERRL, conjunctiva/corneas clear, EOM's intact       Lungs:     Clear to auscultation bilaterally, respirations unlabored  Heart:    Regular rate and rhythm  Neurologic:   Awake, alert, oriented x 3. No apparent focal neurological           defect.           Assessment & Plan:     1. Gitelman syndrome Potassium normalized, but still off of spironolactone. She will restart if her BP gets above 120. Meanwhile continue liquid potassium supplement with promethazine. If she resumes spironolactone she can stop supplement. Will recheck renal panel the first of June  2.  Nausea with vomiting Resolved.   3. Gastroesophageal reflux disease without esophagitis She requests refill.  - esomeprazole (NEXIUM) 40 MG capsule; Take 1 capsule (40 mg total) by mouth daily before breakfast.  Dispense: 30 capsule; Refill: 11  4. Chronic pain associated with significant psychosocial dysfunction She needs to discontinue gabapentin due to insurance formulary, will change to  amitriptyline (ELAVIL) 25 MG tablet; Take 1 tablet (25 mg total) by mouth at bedtime.  Dispense: 30 tablet; Refill: 1 - oxyCODONE-acetaminophen (PERCOCET) 7.5-325 MG tablet; One tablet every 4-5 hours as needed  Dispense: 28 tablet; Refill: 0  5. Upper respiratory tract infection, unspecified type  - amoxicillin (AMOXIL) 500 MG capsule; Take 2 capsules (1,000 mg total) by mouth 2 (two) times daily for 7 days.  Dispense: 28 capsule; Refill: 0       Lelon Huh, MD  [facility]

## 2018-01-06 ENCOUNTER — Other Ambulatory Visit: Payer: Self-pay | Admitting: Family Medicine

## 2018-01-16 ENCOUNTER — Telehealth: Payer: Self-pay | Admitting: Family Medicine

## 2018-01-16 ENCOUNTER — Other Ambulatory Visit: Payer: Self-pay | Admitting: Gastroenterology

## 2018-01-16 DIAGNOSIS — E876 Hypokalemia: Secondary | ICD-10-CM

## 2018-01-16 DIAGNOSIS — G894 Chronic pain syndrome: Secondary | ICD-10-CM

## 2018-01-16 MED ORDER — AMITRIPTYLINE HCL 50 MG PO TABS: 50.0000 mg | ORAL_TABLET | Freq: Every day | ORAL | 3 refills | Status: DC

## 2018-01-16 NOTE — Telephone Encounter (Signed)
That's fine, [facility] Higher doses can cause heart arrhythmias, so do NOT take more than 50mg  a day

## 2018-01-16 NOTE — Telephone Encounter (Signed)
Patient was advised. Expressed understanding.  

## 2018-01-16 NOTE — Telephone Encounter (Signed)
Pt wants to know if she can increase the amitriptyline to 50 mg.  25 mg was not working that great so she tried 50 mg and it helped  Pt's call back I s [phone]  Thanks teri

## 2018-01-16 NOTE — Telephone Encounter (Signed)
Please advise 

## 2018-01-17 NOTE — Telephone Encounter (Signed)
Please advise if I should refill this medication.

## 2018-01-23 ENCOUNTER — Telehealth: Payer: Self-pay | Admitting: Family Medicine

## 2018-01-23 DIAGNOSIS — E876 Hypokalemia: Secondary | ICD-10-CM

## 2018-01-23 NOTE — Telephone Encounter (Signed)
Please advise it is time to recheck renal panel and potassium. Please print and leave pended order at labs.

## 2018-01-24 NOTE — Telephone Encounter (Signed)
Patient advised and lab slip printed.

## 2018-01-25 DIAGNOSIS — E876 Hypokalemia: Secondary | ICD-10-CM | POA: Diagnosis not present

## 2018-01-26 LAB — RENAL FUNCTION PANEL
Albumin: 4.5 g/dL (ref 3.5–5.5)
BUN / CREAT RATIO: 10 (ref 9–23)
BUN: 7 mg/dL (ref 6–24)
CHLORIDE: 104 mmol/L (ref 96–106)
CO2: 23 mmol/L (ref 20–29)
Calcium: 9.3 mg/dL (ref 8.7–10.2)
Creatinine, Ser: 0.68 mg/dL (ref 0.57–1.00)
GFR, EST AFRICAN AMERICAN: 122 mL/min/{1.73_m2} (ref >59)
GFR, EST NON AFRICAN AMERICAN: 106 mL/min/{1.73_m2} (ref >59)
GLUCOSE: 71 mg/dL (ref 65–99)
Phosphorus: 3 mg/dL (ref 2.5–4.5)
Potassium: 3.6 mmol/L (ref 3.5–5.2)
Sodium: 141 mmol/L (ref 134–144)

## 2018-02-13 ENCOUNTER — Other Ambulatory Visit: Payer: Self-pay | Admitting: Family Medicine

## 2018-02-25 ENCOUNTER — Telehealth: Payer: Self-pay | Admitting: Family Medicine

## 2018-02-25 ENCOUNTER — Other Ambulatory Visit: Payer: Self-pay | Admitting: Family Medicine

## 2018-02-25 DIAGNOSIS — E876 Hypokalemia: Secondary | ICD-10-CM

## 2018-02-25 NOTE — Telephone Encounter (Signed)
Pt is requesting lab slip to have her potassium rechecked. Pt stated that she has been feeling weak recently and she just wanted to have it checked. Pt was advised that she may need an OV. Pt request to see if she could just have the lab done first. Please advise. Thanks TNP

## 2018-02-25 NOTE — Telephone Encounter (Signed)
Patient advised and lab slip printed, then placed at [address].

## 2018-02-25 NOTE — Telephone Encounter (Signed)
Please advise whether patient needs an appointment or if its ok to give her a labs slip to have Potassium level checked.

## 2018-02-25 NOTE — Telephone Encounter (Signed)
OK to order potassium and magnesium levels for hypokalemia

## 2018-02-26 ENCOUNTER — Telehealth: Payer: Self-pay

## 2018-02-26 LAB — MAGNESIUM: Magnesium: 2 mg/dL (ref 1.6–2.3)

## 2018-02-26 LAB — POTASSIUM: Potassium: 3.6 mmol/L (ref 3.5–5.2)

## 2018-02-26 NOTE — Telephone Encounter (Signed)
-----   Message from [name], MD sent at 02/26/2018  9:04 AM EDT ----- Potassium level is normal at 3.6. Continue current medications.

## 2018-02-26 NOTE — Telephone Encounter (Signed)
Patient advised as below. Patient verbalizes understanding and is in agreement with treatment plan.  

## 2018-04-24 ENCOUNTER — Other Ambulatory Visit: Payer: Self-pay | Admitting: Family Medicine

## 2018-05-07 ENCOUNTER — Telehealth: Payer: Self-pay | Admitting: Family Medicine

## 2018-05-07 DIAGNOSIS — E876 Hypokalemia: Secondary | ICD-10-CM

## 2018-05-07 DIAGNOSIS — E039 Hypothyroidism, unspecified: Secondary | ICD-10-CM

## 2018-05-07 NOTE — Telephone Encounter (Signed)
Pt is requesting lab slip to get labs done before her medication f/u appt on 05-17-18.  Please advise.  Thanks [employer]

## 2018-05-08 NOTE — Telephone Encounter (Signed)
Please print pended order for patient to leave at lab.

## 2018-05-09 NOTE — Telephone Encounter (Signed)
Left detailed message on pt's vm

## 2018-05-10 ENCOUNTER — Other Ambulatory Visit: Payer: Self-pay | Admitting: Family Medicine

## 2018-05-10 DIAGNOSIS — E876 Hypokalemia: Secondary | ICD-10-CM | POA: Diagnosis not present

## 2018-05-10 DIAGNOSIS — E039 Hypothyroidism, unspecified: Secondary | ICD-10-CM | POA: Diagnosis not present

## 2018-05-11 LAB — POTASSIUM: POTASSIUM: 2.9 mmol/L — AB (ref 3.5–5.2)

## 2018-05-11 LAB — MAGNESIUM: MAGNESIUM: 2 mg/dL (ref 1.6–2.3)

## 2018-05-11 LAB — TSH: TSH: 1.49 u[IU]/mL (ref 0.450–4.500)

## 2018-05-17 ENCOUNTER — Ambulatory Visit: Payer: BLUE CROSS/BLUE SHIELD | Admitting: Family Medicine

## 2018-05-17 ENCOUNTER — Encounter: Payer: Self-pay | Admitting: Family Medicine

## 2018-05-17 VITALS — BP 135/73 | HR 88 | Temp 98.2°F | Resp 16 | Ht 62.0 in | Wt 122.0 lb

## 2018-05-17 DIAGNOSIS — E876 Hypokalemia: Secondary | ICD-10-CM

## 2018-05-17 DIAGNOSIS — J069 Acute upper respiratory infection, unspecified: Secondary | ICD-10-CM

## 2018-05-17 DIAGNOSIS — J45909 Unspecified asthma, uncomplicated: Secondary | ICD-10-CM

## 2018-05-17 MED ORDER — POTASSIUM CHLORIDE CRYS ER 20 MEQ PO TBCR: 20.0000 meq | EXTENDED_RELEASE_TABLET | Freq: Every day | ORAL | 3 refills | Status: DC

## 2018-05-17 MED ORDER — CELECOXIB 100 MG PO CAPS: 100.0000 mg | ORAL_CAPSULE | Freq: Two times a day (BID) | ORAL | 4 refills | Status: DC

## 2018-05-17 MED ORDER — AZITHROMYCIN 250 MG PO TABS: ORAL_TABLET | ORAL | 0 refills | Status: AC

## 2018-05-17 NOTE — Patient Instructions (Signed)
   You are overdue for routine physical and pap. You can call for referral when you decide which gyn you want to see.

## 2018-05-17 NOTE — Progress Notes (Signed)
Patient: Kayla Curry Female    DOB: July 04, 1971   47 y.o.   MRN: [id number] Visit Date: 05/17/2018  Today's Provider: Lelon Huh, MD   Chief Complaint  Patient presents with  . Follow-up  . 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 Patient was advised to double K+ supplements due to low K+ level.  She is no longer taking aldactone due to having low blood pressure on it. She has been taking liquid potassium, but would like to go back to tablets and take along with promethazine to help with nausea.   Chronic pain associated with significant psychosocial dysfunction From 12/28/2017-changed from gabapentin to amitriptyline (ELAVIL) 25 MG tablet due to insurance formulary. She states amitriptyline helped, but made her to foggy headed, so she stopped. She would like to try an anti-inflammatory, but states that ibuprofen causes stomach pains. She does take OTC tylenol with minimal relief.   Hypothyroidism From 05/10/2018-labs only, no changes. Lab Results  Component Value Date   TSH 1.490 05/10/2018   She also reports having persistent cough and chest congestion for the last week. No sinus or head congestion. No dyspnea. No fevers.    Allergies  Allergen Reactions  . Advair Diskus [Fluticasone-Salmeterol] Shortness Of Breath  . Albuterol Sulfate Shortness Of Breath  . Atrovent Shortness Of Breath  . Bethanechol Shortness Of Breath, Rash and Other (See Comments)    RESPIRATORY DISTRESS  . Budesonide-Formoterol Fumarate Shortness Of Breath  . Influenza Vac Split Quad Anaphylaxis  . Influenza Vaccines Anaphylaxis and Shortness Of Breath  . Ipratropium Shortness Of Breath  . Ondansetron Rash  . Shellfish Allergy Anaphylaxis and Shortness Of Breath  . Xopenex [Levalbuterol] Shortness Of Breath  . Compazine Other (See Comments)    DYSTONIC  . Fluoride Preparations   . Lorazepam Other (See Comments)   "HAS OPPOSITE EFFECT"  . Levofloxacin Rash  . Other Rash    DYSTONIC  . Potassium-Containing Compounds Nausea And Vomiting    Just the po potassium  No problem with iv  . Propofol Nausea And Vomiting and Rash  . Zofran [Ondansetron Hcl] Rash     Current Outpatient Medications:  .  baclofen (LIORESAL) 20 MG tablet, TAKE 1 TABLET (20 MG TOTAL) BY MOUTH 4 (FOUR) TIMES DAILY., Disp: 120 tablet, Rfl: 5 .  calcium carbonate (TUMS - DOSED IN MG ELEMENTAL CALCIUM) 500 MG chewable tablet, Chew 2.5 tablets (500 mg of elemental calcium total) by mouth 2 (two) times daily with a meal., Disp: 60 tablet, Rfl: 1 .  EPINEPHrine (EPIPEN 2-PAK) 0.3 mg/0.3 mL IJ SOAJ injection, Inject into the muscle., Disp: , Rfl:  .  esomeprazole (NEXIUM) 40 MG capsule, Take 1 capsule (40 mg total) by mouth daily before breakfast., Disp: 30 capsule, Rfl: 11 .  magnesium oxide (MAG-OX) 400 (241.3 Mg) MG tablet, Take 1 tablet (400 mg total) by mouth 2 (two) times daily. (Patient taking differently: Take 400 mg by mouth daily. ), Disp: 60 tablet, Rfl: 0 .  potassium chloride 20 MEQ/15ML (10%) SOLN, TAKE 7.5 MLS (10 MEQ TOTAL) BY MOUTH DAILY FOR 30 DOSES., Disp: 225 mL, Rfl: 5 .  promethazine (PHENERGAN) 25 MG tablet, TAKE 1 TABLET BY MOUTH EVERY 6 HOURS AS NEEDED FOR NAUSEA, Disp: 60 tablet, Rfl: 5 .  SYNTHROID 75 MCG tablet, TAKE 1 TABLET BY MOUTH DAILY. PLEASE SCHEDULE OFFICE VISIT FOR FOLLOWUP, Disp: 30 tablet, Rfl: 11 .  tiZANidine (ZANAFLEX) 4 MG tablet, TAKE 1 TABLET (4 MG TOTAL) BY MOUTH 2 (TWO) TIMES DAILY., Disp: 60 tablet, Rfl: 5 .  gabapentin (NEURONTIN) 600 MG tablet, TAKE 1 TABLET (600 MG TOTAL) BY MOUTH 3 (THREE) TIMES DAILY., Disp: 90 tablet, Rfl: 4 .  metaproterenol (ALUPENT) 20 MG tablet, Take 1 tablet (20 mg total) by mouth 3 (three) times daily as needed., Disp: 60 tablet, Rfl: 3 .  spironolactone (ALDACTONE) 100 MG tablet, TAKE 1 TABLET (100 MG TOTAL) BY MOUTH 2 (TWO) TIMES DAILY. (Patient not taking: Reported  on 12/28/2017), Disp: 60 tablet, Rfl: 5  OTC potassium 99mg , 2 tablets twice a day.   Review of Systems  Constitutional: Negative for appetite change, chills, fatigue and fever.  Respiratory: Negative for chest tightness and shortness of breath.   Cardiovascular: Negative for chest pain and palpitations.  Gastrointestinal: Negative for abdominal pain, nausea and vomiting.  Neurological: Negative for dizziness and weakness.    Social History   Tobacco Use  . Smoking status: Never Smoker  . Smokeless tobacco: Never Used  Substance Use Topics  . Alcohol use: No   Objective:   BP 135/73 (BP Location: Right Arm, Patient Position: Sitting, Cuff Size: Normal)   Pulse 88   Temp 98.2 F (36.8 C) (Oral)   Resp 16   Ht 5\' 2"  (1.575 m)   Wt 122 lb (55.3 kg)   SpO2 99%   BMI 22.31 kg/m    Physical Exam  General Appearance:    Alert, cooperative, no distress  HENT:   ENT exam normal, no neck nodes or sinus tenderness, neck without nodes, post nasal drip noted and nasal mucosa pale and congested  Eyes:    PERRL, conjunctiva/corneas clear, EOM's intact       Lungs:     Clear to auscultation bilaterally, respirations unlabored  Heart:    Regular rate and rhythm  Neurologic:   Awake, alert, oriented x 3. No apparent focal neurological           defect.           Assessment & Plan:      1. Gitelman syndrome  2. Hypokalemia Currently off aldactone due to hypotension. Will change from liquid potassium to potassium tablets 58mEq daily and may take with promethazine for nausea - Renal function panel; Future (1 month)  2. Extrinsic asthma without complication, unspecified asthma severity, unspecified whether persistent   3. Upper respiratory tract infection, unspecified type Zpack. Call if symptoms change or if not rapidly improving.          Lelon Huh, MD  [facility]

## 2018-05-31 ENCOUNTER — Ambulatory Visit (INDEPENDENT_AMBULATORY_CARE_PROVIDER_SITE_OTHER): Payer: BLUE CROSS/BLUE SHIELD | Admitting: Family Medicine

## 2018-05-31 ENCOUNTER — Encounter: Payer: Self-pay | Admitting: Family Medicine

## 2018-05-31 VITALS — BP 134/80 | HR 89 | Temp 98.1°F | Wt 127.0 lb

## 2018-05-31 DIAGNOSIS — E876 Hypokalemia: Secondary | ICD-10-CM | POA: Diagnosis not present

## 2018-05-31 DIAGNOSIS — J4521 Mild intermittent asthma with (acute) exacerbation: Secondary | ICD-10-CM | POA: Diagnosis not present

## 2018-05-31 MED ORDER — AZITHROMYCIN 250 MG PO TABS: ORAL_TABLET | ORAL | 0 refills | Status: DC

## 2018-05-31 MED ORDER — PREDNISONE 5 MG PO TABS: 5.0000 mg | ORAL_TABLET | Freq: Every day | ORAL | 0 refills | Status: DC

## 2018-05-31 NOTE — Progress Notes (Signed)
Patient: Kayla Curry Female    DOB: 1970/11/24   47 y.o.   MRN: [id number] Visit Date: 05/31/2018  Today's Provider: Vernie Murders, PA   Chief Complaint  Patient presents with  . URI    Patient presents today with cough, fever (100.9 on 05/30/18) and chest tightness symptoms. Patient states she has been having the symptoms for 6 day now. Patient has been taking Robitussin  cough medication and tylenol for fever on 05/31/18.   Subjective:    HPI Onset of cough and fever over the past 5-6 days. No contact with anyone that is having similar symptoms. Some slight wheeze and cough is dry. No stuffy head or sore throat. Tylenol helps with fever control. Not much help from the Robitussin for cough.    Past Medical History:  Diagnosis Date  . Adrenal adenoma, right   . Asthma   . Chronic pain syndrome   . GERD (gastroesophageal reflux disease)   . Gitelman syndrome    DISORDER MAGNESIUM METABOLISM  . History of pelvic fracture   . History of supraventricular tachycardia    PER PT ON 02-28-2013 NO ISSUES SINCE ABLATION IN 2005  . Hypokalemia    CHRONIC  . Hypothyroidism   . 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 Magnesium metabolism disorder NEPHROLOGIST-  DR [name] ([facility] [place])   [place]  . Malfunction of device    INTERSTIM  . Neurogenic bladder   . PONV (postoperative nausea and vomiting)    reports PONV and rash with propofol  . S/P ablation of ventricular arrhythmia HX SVT--  2005   [place]--  LAST VISIT 2012 -- PT RELEASED FROM CARE ON PRN BASIS  . Self-catheterizes urinary bladder   . Urinary retention    RESIDUAL FROM PELVIC FX INJURY---Self cath TID and prn  . Wears contact lenses    Past Surgical History:  Procedure Laterality Date  . APPENDECTOMY  1994   EXPL. LAP.  Marland Kitchen CARDIAC ELECTROPHYSIOLOGY STUDY AND ABLATION  2005- AT [facility]   SVT--   PT STATES NO ISSUES SINCE   . ESOPHAGOGASTRODUODENOSCOPY (EGD) WITH PROPOFOL N/A 11/02/2017   Procedure: ESOPHAGOGASTRODUODENOSCOPY (EGD) WITH PROPOFOL;  Surgeon: Lucilla Lame, MD;  Location: [place];  Service: Endoscopy;  Laterality: N/A;  . INTERSTIM IMPLANT PLACEMENT  2009[facility]  . INTERSTIM IMPLANT PLACEMENT  10/05/2011   Procedure: Barrie Lyme IMPLANT FIRST STAGE;  Surgeon: [name], MD;  Location: [facility];  Service: Urology;  Laterality: N/A;  . INTERSTIM IMPLANT PLACEMENT  10/05/2011   Procedure: Barrie Lyme IMPLANT SECOND STAGE;  Surgeon: [name], MD;  Location: [facility];  Service: Urology;  Laterality: N/A;  . INTERSTIM IMPLANT PLACEMENT N/A 11/13/2016   Procedure: Barrie Lyme IMPLANT FIRST STAGE;  Surgeon: [name], MD;  Location: [facility];  Service: Urology;  Laterality: N/A;  . INTERSTIM IMPLANT PLACEMENT N/A 11/13/2016   Procedure: Barrie Lyme IMPLANT SECOND STAGE;  Surgeon: [name], MD;  Location: [facility];  Service: Urology;  Laterality: N/A;  . INTERSTIM IMPLANT REMOVAL N/A 11/13/2016   Procedure: REMOVAL OF INTERSTIM IMPLANT ONE AND TWO;  Surgeon: [name], MD;  Location: [facility];  Service: Urology;  Laterality: N/A;  . INTERSTIM IMPLANT REVISION N/A 03/06/2013   Procedure: REVISION OF Barrie Lyme;  Surgeon: [name], MD;  Location: [place];  Service: Urology;  Laterality: N/A;  . LAPAROSCOPIC CHOLECYSTECTOMY  2001  . TONSILLECTOMY  1998  . VAGINAL HYSTERECTOMY  2003   Family History  Problem Relation Age of Onset  . Hypertension Father   . Colon cancer Neg Hx    Allergies  Allergen Reactions  . Advair Diskus [Fluticasone-Salmeterol] Shortness Of Breath  . Albuterol Sulfate Shortness Of Breath  . Atrovent Shortness Of Breath  . Bethanechol Shortness Of Breath, Rash and Other (See Comments)     RESPIRATORY DISTRESS  . Budesonide-Formoterol Fumarate Shortness Of Breath  . Influenza Vac Split Quad Anaphylaxis  . Influenza Vaccines Anaphylaxis and Shortness Of Breath  . Ipratropium Shortness Of Breath  . Ondansetron Rash  . Shellfish Allergy Anaphylaxis and Shortness Of Breath  . Xopenex [Levalbuterol] Shortness Of Breath  . Compazine Other (See Comments)    DYSTONIC  . Fluoride Preparations   . Lorazepam Other (See Comments)    "HAS OPPOSITE EFFECT"  . Levofloxacin Rash  . Other Rash    DYSTONIC  . Potassium-Containing Compounds Nausea And Vomiting    Just the po potassium  No problem with iv  . Propofol Nausea And Vomiting and Rash  . Zofran [Ondansetron Hcl] Rash    Current Outpatient Medications:  .  baclofen (LIORESAL) 20 MG tablet, TAKE 1 TABLET (20 MG TOTAL) BY MOUTH 4 (FOUR) TIMES DAILY., Disp: 120 tablet, Rfl: 5 .  calcium carbonate (TUMS - DOSED IN MG ELEMENTAL CALCIUM) 500 MG chewable tablet, Chew 2.5 tablets (500 mg of elemental calcium total) by mouth 2 (two) times daily with a meal., Disp: 60 tablet, Rfl: 1 .  celecoxib (CELEBREX) 100 MG capsule, Take 1 capsule (100 mg total) by mouth 2 (two) times daily., Disp: 60 capsule, Rfl: 4 .  EPINEPHrine (EPIPEN 2-PAK) 0.3 mg/0.3 mL IJ SOAJ injection, Inject into the muscle., Disp: , Rfl:  .  esomeprazole (NEXIUM) 40 MG capsule, Take 1 capsule (40 mg total) by mouth daily before breakfast., Disp: 30 capsule, Rfl: 11 .  magnesium oxide (MAG-OX) 400 (241.3 Mg) MG tablet, Take 1 tablet (400 mg total) by mouth 2 (two) times daily. (Patient taking differently: Take 400 mg by mouth daily. ), Disp: 60 tablet, Rfl: 0 .  metaproterenol (ALUPENT) 20 MG tablet, Take 1 tablet (20 mg total) by mouth 3 (three) times daily as needed., Disp: 60 tablet, Rfl: 3 .  potassium chloride SA (K-DUR,KLOR-CON) 20 MEQ tablet, Take 1 tablet (20 mEq total) by mouth daily., Disp: 30 tablet, Rfl: 3 .  promethazine (PHENERGAN) 25 MG tablet, TAKE 1 TABLET  BY MOUTH EVERY 6 HOURS AS NEEDED FOR NAUSEA, Disp: 60 tablet, Rfl: 5 .  SYNTHROID 75 MCG tablet, TAKE 1 TABLET BY MOUTH DAILY. PLEASE SCHEDULE OFFICE VISIT FOR FOLLOWUP, Disp: 30 tablet, Rfl: 11 .  tiZANidine (ZANAFLEX) 4 MG tablet, TAKE 1 TABLET (4 MG TOTAL) BY MOUTH 2 (TWO) TIMES DAILY., Disp: 60 tablet, Rfl: 5  Review of Systems  Social History   Tobacco Use  . Smoking status: Never Smoker  . Smokeless tobacco: Never Used  Substance Use Topics  . Alcohol use: No   Objective:   BP 134/80 (BP Location: Left Arm, Patient Position: Sitting, Cuff Size: Normal)   Pulse 89   Temp 98.1 F (36.7 C) (Oral)   Wt 127 lb (57.6 kg)   SpO2 99%   BMI 23.23 kg/m  Vitals:   05/31/18 0853  BP: 134/80  Pulse: 89  Temp: 98.1  F (36.7 C)  TempSrc: Oral  SpO2: 99%  Weight: 127 lb (57.6 kg)   Physical Exam  Constitutional: She is oriented to person, place, and time. She appears well-developed and well-nourished. No distress.  HENT:  Head: Normocephalic and atraumatic.  Right Ear: Hearing normal.  Left Ear: Hearing normal.  Nose: Nose normal.  Eyes: Conjunctivae and lids are normal. Right eye exhibits no discharge. Left eye exhibits no discharge. No scleral icterus.  Neck: Neck supple.  Cardiovascular: Normal rate and regular rhythm.  Pulmonary/Chest: Effort normal. No respiratory distress.  Slightly coarse breath sounds. No rales or rhonchi.  Abdominal: Soft. Bowel sounds are normal.  Musculoskeletal: Normal range of motion.  Lymphadenopathy:    She has no cervical adenopathy.  Neurological: She is alert and oriented to person, place, and time.  Skin: Skin is intact. No lesion and no rash noted.  Psychiatric: She has a normal mood and affect. Her speech is normal and behavior is normal. Thought content normal.      Assessment & Plan:     1. Asthmatic bronchitis, mild intermittent, with acute exacerbation Onset the past 6 days with cough with low grade fever. No sputum production  but slight coarse breath sounds. No sore throat or nasal congestion. Will give Azithromycin and Prednisone taper. May use OTC Alupent prn wheeze (don't tolerate inhalers). May use saline in nebulizer at home as needed. Continue Tylenol prn. Check CBC with diff and follow up pending reports. - CBC with Differential/Platelet - azithromycin (ZITHROMAX) 250 MG tablet; Take two tablets by mouth the first day then one daily for 4 days.  Dispense: 6 tablet; Refill: 0 - predniSONE (DELTASONE) 5 MG tablet; Take 1 tablet (5 mg total) by mouth daily with breakfast. Taper down by one tablet daily (6,5,4,3,2,1)  Dispense: 21 tablet; Refill: 0  2. Hypokalemia Feeling fatigue again and concerned about drop in potassium. Still taking Klor-con 20 meq qd and Magnesium 400 mg qd. Encouraged to eat 3 meals a day and drink plenty of fluids. Recheck CMP. - Comprehensive metabolic panel       Vernie Murders, PA  [facility]

## 2018-06-01 LAB — CBC WITH DIFFERENTIAL/PLATELET
BASOS ABS: 0.1 10*3/uL (ref 0.0–0.2)
Basos: 1 %
EOS (ABSOLUTE): 0.1 10*3/uL (ref 0.0–0.4)
EOS: 1 %
HEMATOCRIT: 43.1 % (ref 34.0–46.6)
HEMOGLOBIN: 14.2 g/dL (ref 11.1–15.9)
IMMATURE GRANS (ABS): 0 10*3/uL (ref 0.0–0.1)
Immature Granulocytes: 0 %
LYMPHS ABS: 1.6 10*3/uL (ref 0.7–3.1)
LYMPHS: 16 %
MCH: 29.4 pg (ref 26.6–33.0)
MCHC: 32.9 g/dL (ref 31.5–35.7)
MCV: 89 fL (ref 79–97)
MONOCYTES: 5 %
Monocytes Absolute: 0.5 10*3/uL (ref 0.1–0.9)
Neutrophils Absolute: 7.3 10*3/uL — ABNORMAL HIGH (ref 1.4–7.0)
Neutrophils: 77 %
Platelets: 371 10*3/uL (ref 150–450)
RBC: 4.83 x10E6/uL (ref 3.77–5.28)
RDW: 11.9 % — ABNORMAL LOW (ref 12.3–15.4)
WBC: 9.5 10*3/uL (ref 3.4–10.8)

## 2018-06-01 LAB — COMPREHENSIVE METABOLIC PANEL
ALBUMIN: 5.1 g/dL (ref 3.5–5.5)
ALK PHOS: 63 IU/L (ref 39–117)
ALT: 18 IU/L (ref 0–32)
AST: 17 IU/L (ref 0–40)
Albumin/Globulin Ratio: 2.4 — ABNORMAL HIGH (ref 1.2–2.2)
BUN / CREAT RATIO: 25 — AB (ref 9–23)
BUN: 16 mg/dL (ref 6–24)
Bilirubin Total: 0.3 mg/dL (ref 0.0–1.2)
CO2: 22 mmol/L (ref 20–29)
CREATININE: 0.65 mg/dL (ref 0.57–1.00)
Calcium: 10 mg/dL (ref 8.7–10.2)
Chloride: 104 mmol/L (ref 96–106)
GFR calc Af Amer: 123 mL/min/{1.73_m2} (ref >59)
GFR calc non Af Amer: 107 mL/min/{1.73_m2} (ref >59)
Globulin, Total: 2.1 g/dL (ref 1.5–4.5)
Glucose: 77 mg/dL (ref 65–99)
Potassium: 3.7 mmol/L (ref 3.5–5.2)
SODIUM: 144 mmol/L (ref 134–144)
Total Protein: 7.2 g/dL (ref 6.0–8.5)

## 2018-06-03 ENCOUNTER — Other Ambulatory Visit: Payer: Self-pay | Admitting: Family Medicine

## 2018-06-03 DIAGNOSIS — E039 Hypothyroidism, unspecified: Secondary | ICD-10-CM

## 2018-06-03 DIAGNOSIS — E559 Vitamin D deficiency, unspecified: Secondary | ICD-10-CM

## 2018-06-03 DIAGNOSIS — E876 Hypokalemia: Secondary | ICD-10-CM

## 2018-06-03 NOTE — Progress Notes (Signed)
Future order placed in chart that will have to be released to allow the lab requisition to print out at the front desk for her to get these tests done the week before her appointment.

## 2018-06-05 ENCOUNTER — Telehealth: Payer: Self-pay | Admitting: Family Medicine

## 2018-06-05 NOTE — Telephone Encounter (Signed)
LMTCB 06/05/2018  Thanks,   -Mickel Baas

## 2018-06-05 NOTE — Telephone Encounter (Signed)
Pt returned Laura's missed call. °Please call pt back. ° °Thanks, °[facility] °

## 2018-06-05 NOTE — Telephone Encounter (Signed)
Pt was seen last week.  Now having chest pain, coughing, and nausea for last 3 days. Pt cannot get a good deep breath.   Needing to know what Dr. Caryn Section wants her to do.  Please advise.  Thanks, [employer]

## 2018-06-06 NOTE — Telephone Encounter (Signed)
LVMTRC.,PC 

## 2018-06-06 NOTE — Telephone Encounter (Signed)
Tried calling patient. Left message to call back. 

## 2018-06-06 NOTE — Telephone Encounter (Signed)
Patient states she is feeling little better and phernagan for nausea.

## 2018-06-06 NOTE — Telephone Encounter (Signed)
She needs to be seen. If having chest pain then need to go to ER or urgent care today.

## 2018-06-15 ENCOUNTER — Other Ambulatory Visit: Payer: Self-pay | Admitting: Family Medicine

## 2018-06-28 ENCOUNTER — Other Ambulatory Visit: Payer: Self-pay

## 2018-06-28 ENCOUNTER — Encounter: Payer: Self-pay | Admitting: Family Medicine

## 2018-06-28 ENCOUNTER — Ambulatory Visit (INDEPENDENT_AMBULATORY_CARE_PROVIDER_SITE_OTHER): Payer: BLUE CROSS/BLUE SHIELD | Admitting: Family Medicine

## 2018-06-28 VITALS — BP 148/80 | HR 94 | Temp 98.0°F | Ht 62.0 in | Wt 126.8 lb

## 2018-06-28 DIAGNOSIS — R05 Cough: Secondary | ICD-10-CM | POA: Diagnosis not present

## 2018-06-28 DIAGNOSIS — J45909 Unspecified asthma, uncomplicated: Secondary | ICD-10-CM | POA: Diagnosis not present

## 2018-06-28 DIAGNOSIS — R053 Chronic cough: Secondary | ICD-10-CM

## 2018-06-28 MED ORDER — METAPROTERENOL SULFATE 20 MG PO TABS: 20.0000 mg | ORAL_TABLET | Freq: Three times a day (TID) | ORAL | 3 refills | Status: DC | PRN

## 2018-06-28 MED ORDER — PREDNISONE 5 MG PO TABS: ORAL_TABLET | ORAL | 0 refills | Status: DC

## 2018-06-28 NOTE — Progress Notes (Signed)
Patient: Kayla Curry Female    DOB: 11-18-1970   47 y.o.   MRN: [id number] Visit Date: 06/28/2018  Today's Provider: Vernie Murders, PA   Chief Complaint  Patient presents with  . Cough    going on since before 05/31/18   Subjective:    HPI  Pt reports she is still coughing very bad.  This has been going on for over a month now since before her last visit on 05/31/18.  Cough is non-productive and has some wheezing at times and some chest tightness.     Past Medical History:  Diagnosis Date  . Adrenal adenoma, right   . Asthma   . Chronic pain syndrome   . GERD (gastroesophageal reflux disease)   . Gitelman syndrome    DISORDER MAGNESIUM METABOLISM  . History of pelvic fracture   . History of supraventricular tachycardia    PER PT ON 02-28-2013 NO ISSUES SINCE ABLATION IN 2005  . Hypokalemia    CHRONIC  . Hypothyroidism   . 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 Magnesium metabolism disorder NEPHROLOGIST-  DR [name] ([facility] IN [place])   [place]  . Malfunction of device    INTERSTIM  . Neurogenic bladder   . PONV (postoperative nausea and vomiting)    reports PONV and rash with propofol  . S/P ablation of ventricular arrhythmia HX SVT--  2005   [place]--  LAST VISIT 2012 -- PT RELEASED FROM CARE ON PRN BASIS  . Self-catheterizes urinary bladder   . Urinary retention    RESIDUAL FROM PELVIC FX INJURY---Self cath TID and prn  . Wears contact lenses    Patient Active Problem List   Diagnosis Date Noted  . Gitelman syndrome 12/19/2017  . Adjustment disorder with mixed anxiety and depressed mood 12/17/2017  . Hypomagnesemia 12/16/2017  . Epigastric pain   . Gastritis without bleeding   . Other diseases of stomach and duodenum   . Chest pain 04/07/2016  . Back ache 06/10/2015  . GERD (gastroesophageal reflux disease) 06/10/2015  . Insomnia  06/10/2015  . Menopausal disorder 06/10/2015  . Neurogenic bladder disorder 06/10/2015  . Pulmonary nodule 06/29/2011  . Vitamin D deficiency 11/07/2009  . Alkalosis 11/04/2009  . Palpitations 04/24/2006  . Asthma, extrinsic 09/29/2005  . Chronic pain associated with significant psychosocial dysfunction 09/29/2005  . Hypokalemia 09/29/2005  . Hypothyroidism 09/29/2005  . Adrenal mass ([place]) 08/21/2001   Past Surgical History:  Procedure Laterality Date  . APPENDECTOMY  1994   EXPL. LAP.  Marland Kitchen CARDIAC ELECTROPHYSIOLOGY STUDY AND ABLATION  2005- AT [facility]   SVT--  PT STATES NO ISSUES SINCE   . ESOPHAGOGASTRODUODENOSCOPY (EGD) WITH PROPOFOL N/A 11/02/2017   Procedure: ESOPHAGOGASTRODUODENOSCOPY (EGD) WITH PROPOFOL;  Surgeon: Lucilla Lame, MD;  Location: [facility];  Service: Endoscopy;  Laterality: N/A;  . INTERSTIM IMPLANT PLACEMENT  2009[facility]  . INTERSTIM IMPLANT PLACEMENT  10/05/2011   Procedure: Barrie Lyme IMPLANT FIRST STAGE;  Surgeon: [name], MD;  Location[facility];  Service: Urology;  Laterality: N/A;  . INTERSTIM IMPLANT PLACEMENT  10/05/2011   Procedure: Barrie Lyme IMPLANT SECOND STAGE;  Surgeon: [name], MD;  Location: [facility];  Service: Urology;  Laterality: N/A;  . INTERSTIM IMPLANT PLACEMENT N/A 11/13/2016   Procedure: Barrie Lyme IMPLANT FIRST STAGE;  Surgeon: [name], MD;  Location:  [place];  Service: Urology;  Laterality: N/A;  . INTERSTIM IMPLANT PLACEMENT N/A 11/13/2016   Procedure: Barrie Lyme IMPLANT SECOND STAGE;  Surgeon: [name], MD;  Location: [facility];  Service: Urology;  Laterality: N/A;  . INTERSTIM IMPLANT REMOVAL N/A 11/13/2016   Procedure: REMOVAL OF INTERSTIM IMPLANT ONE AND TWO;  Surgeon: [name], MD;  Location: [facility];  Service: Urology;  Laterality: N/A;  . INTERSTIM IMPLANT REVISION N/A 03/06/2013    Procedure: REVISION OF Barrie Lyme;  Surgeon: [name], MD;  Location: [facility];  Service: Urology;  Laterality: N/A;  . LAPAROSCOPIC CHOLECYSTECTOMY  2001  . TONSILLECTOMY  1998  . VAGINAL HYSTERECTOMY  2003   Family History  Problem Relation Age of Onset  . Hypertension Father   . Colon cancer Neg Hx    Allergies  Allergen Reactions  . Advair Diskus [Fluticasone-Salmeterol] Shortness Of Breath  . Albuterol Sulfate Shortness Of Breath  . Atrovent Shortness Of Breath  . Bethanechol Shortness Of Breath, Rash and Other (See Comments)    RESPIRATORY DISTRESS  . Budesonide-Formoterol Fumarate Shortness Of Breath  . Influenza Vac Split Quad Anaphylaxis  . Influenza Vaccines Anaphylaxis and Shortness Of Breath  . Ipratropium Shortness Of Breath  . Ondansetron Rash  . Shellfish Allergy Anaphylaxis and Shortness Of Breath  . Xopenex [Levalbuterol] Shortness Of Breath  . Compazine Other (See Comments)    DYSTONIC  . Fluoride Preparations   . Lorazepam Other (See Comments)    "HAS OPPOSITE EFFECT"  . Levofloxacin Rash  . Other Rash    DYSTONIC  . Potassium-Containing Compounds Nausea And Vomiting    Just the po potassium  No problem with iv  . Propofol Nausea And Vomiting and Rash  . Zofran [Ondansetron Hcl] Rash    Current Outpatient Medications:  .  baclofen (LIORESAL) 20 MG tablet, TAKE 1 TABLET (20 MG TOTAL) BY MOUTH 4 (FOUR) TIMES DAILY., Disp: 120 tablet, Rfl: 5 .  calcium carbonate (TUMS - DOSED IN MG ELEMENTAL CALCIUM) 500 MG chewable tablet, Chew 2.5 tablets (500 mg of elemental calcium total) by mouth 2 (two) times daily with a meal., Disp: 60 tablet, Rfl: 1 .  celecoxib (CELEBREX) 100 MG capsule, Take 1 capsule (100 mg total) by mouth 2 (two) times daily., Disp: 60 capsule, Rfl: 4 .  EPINEPHrine (EPIPEN 2-PAK) 0.3 mg/0.3 mL IJ SOAJ injection, Inject into the muscle., Disp: , Rfl:  .  esomeprazole (NEXIUM) 40 MG capsule, Take 1 capsule (40 mg  total) by mouth daily before breakfast., Disp: 30 capsule, Rfl: 11 .  magnesium oxide (MAG-OX) 400 (241.3 Mg) MG tablet, Take 1 tablet (400 mg total) by mouth 2 (two) times daily. (Patient taking differently: Take 400 mg by mouth daily. ), Disp: 60 tablet, Rfl: 0 .  metaproterenol (ALUPENT) 20 MG tablet, Take 1 tablet (20 mg total) by mouth 3 (three) times daily as needed., Disp: 60 tablet, Rfl: 3 .  potassium chloride SA (K-DUR,KLOR-CON) 20 MEQ tablet, Take 1 tablet (20 mEq total) by mouth daily., Disp: 30 tablet, Rfl: 3 .  promethazine (PHENERGAN) 25 MG tablet, TAKE 1 TABLET BY MOUTH EVERY 6 HOURS AS NEEDED FOR NAUSEA, Disp: 60 tablet, Rfl: 5 .  SYNTHROID 75 MCG tablet, TAKE 1 TABLET BY MOUTH DAILY. PLEASE SCHEDULE OFFICE VISIT FOR FOLLOWUP, Disp: 30 tablet, Rfl: 11 .  tiZANidine (ZANAFLEX) 4 MG tablet, TAKE 1 TABLET (4 MG TOTAL) BY MOUTH 2 (TWO) TIMES DAILY., Disp: 60  tablet, Rfl: 5 .  azithromycin (ZITHROMAX) 250 MG tablet, Take two tablets by mouth the first day then one daily for 4 days., Disp: 6 tablet, Rfl: 0 .  predniSONE (DELTASONE) 5 MG tablet, Take 1 tablet (5 mg total) by mouth daily with breakfast. Taper down by one tablet daily (6,5,4,3,2,1), Disp: 21 tablet, Rfl: 0  Review of Systems  Constitutional: Negative.   HENT: Negative.   Eyes: Negative.   Respiratory: Positive for cough, chest tightness, shortness of breath and wheezing. Negative for apnea, choking and stridor.   Cardiovascular: Negative.   Gastrointestinal: Negative.   Endocrine: Negative.   Genitourinary: Negative.   Musculoskeletal: Negative.   Skin: Negative.   Allergic/Immunologic: Negative.   Neurological: Negative.   Hematological: Negative.   Psychiatric/Behavioral: Negative.    Social History   Tobacco Use  . Smoking status: Never Smoker  . Smokeless tobacco: Never Used  Substance Use Topics  . Alcohol use: No   Objective:   BP (!) 148/80 (BP Location: Right Arm, Patient Position: Sitting, Cuff  Size: Normal)   Pulse 94   Temp 98 F (36.7 C) (Oral)   Ht 5\' 2"  (1.575 m)   Wt 126 lb 12.8 oz (57.5 kg)   SpO2 99%   BMI 23.19 kg/m   Wt Readings from Last 3 Encounters:  06/28/18 126 lb 12.8 oz (57.5 kg)  05/31/18 127 lb (57.6 kg)  05/17/18 122 lb (55.3 kg)   Vitals:   06/28/18 0930  BP: (!) 148/80  Pulse: 94  Temp: 98 F (36.7 C)  TempSrc: Oral  SpO2: 99%  Weight: 126 lb 12.8 oz (57.5 kg)  Height: 5\' 2"  (1.575 m)   Physical Exam  Constitutional: She is oriented to person, place, and time. She appears well-developed and well-nourished. No distress.  HENT:  Head: Normocephalic and atraumatic.  Right Ear: Hearing and external ear normal.  Left Ear: Hearing and external ear normal.  Nose: Nose normal.  Mouth/Throat: Oropharynx is clear and moist.  Eyes: Conjunctivae and lids are normal. Right eye exhibits no discharge. Left eye exhibits no discharge. No scleral icterus.  Neck: Neck supple.  Cardiovascular: Normal rate and regular rhythm.  Pulmonary/Chest: Effort normal and breath sounds normal. No stridor. No respiratory distress. She has no wheezes. She has no rales.  Musculoskeletal: Normal range of motion.  Lymphadenopathy:    She has no cervical adenopathy.  Neurological: She is alert and oriented to person, place, and time.  Skin: Skin is intact. No lesion and no rash noted.  Psychiatric: She has a normal mood and affect. Her speech is normal and behavior is normal. Thought content normal.      Assessment & Plan:     1. Persistent cough for 3 weeks or longer No fever, sputum production, sore throat, PND, earache, sneezing, rhinorrhea or flare of GERD symptoms. No palpitations or racing. Was better with last prednisone taper. Will treat with increased humidity in her home, may use saline nebulizer prn, refill her Alupent and given 12 day prednisone taper. Recheck if no better in 2 weeks. - metaproterenol (ALUPENT) 20 MG tablet; Take 1 tablet (20 mg total) by mouth  3 (three) times daily as needed.  Dispense: 60 tablet; Refill: 3 - predniSONE (DELTASONE) 5 MG tablet; 12 day taper by mouth- decrease by one tablet every other day (6,6,5,5,4,4,3,3,2,2,1,1)  Dispense: 42 tablet; Refill: 0  2. Extrinsic asthma without complication, unspecified asthma severity, unspecified whether persistent Some tight sensation in chest without pain. Ran out  of the Alupent that has helped in the past. Was better when on the prednisone taper last month. Will give 12 day taper and refill the Alupent tablets. Has had extensive work-ups with pulmonology in the past. No wheeze noted today. Recheck prn. - metaproterenol (ALUPENT) 20 MG tablet; Take 1 tablet (20 mg total) by mouth 3 (three) times daily as needed.  Dispense: 60 tablet; Refill: 3 - predniSONE (DELTASONE) 5 MG tablet; 12 day taper by mouth- decrease by one tablet every other day (6,6,5,5,4,4,3,3,2,2,1,1)  Dispense: 42 tablet; Refill: Hana, PA  [facility]

## 2018-07-12 ENCOUNTER — Other Ambulatory Visit: Payer: Self-pay | Admitting: Family Medicine

## 2018-07-12 DIAGNOSIS — J45909 Unspecified asthma, uncomplicated: Secondary | ICD-10-CM

## 2018-07-12 DIAGNOSIS — R05 Cough: Secondary | ICD-10-CM

## 2018-07-12 DIAGNOSIS — R053 Chronic cough: Secondary | ICD-10-CM

## 2018-08-02 ENCOUNTER — Ambulatory Visit: Payer: Self-pay | Admitting: Family Medicine

## 2018-08-06 ENCOUNTER — Other Ambulatory Visit: Payer: Self-pay | Admitting: Family Medicine

## 2018-08-09 ENCOUNTER — Other Ambulatory Visit: Payer: Self-pay

## 2018-08-09 DIAGNOSIS — E039 Hypothyroidism, unspecified: Secondary | ICD-10-CM

## 2018-08-09 DIAGNOSIS — E876 Hypokalemia: Secondary | ICD-10-CM | POA: Diagnosis not present

## 2018-08-09 DIAGNOSIS — E559 Vitamin D deficiency, unspecified: Secondary | ICD-10-CM | POA: Diagnosis not present

## 2018-08-10 LAB — COMPREHENSIVE METABOLIC PANEL
ALBUMIN: 4.5 g/dL (ref 3.5–5.5)
ALT: 26 IU/L (ref 0–32)
AST: 21 IU/L (ref 0–40)
Albumin/Globulin Ratio: 2.1 (ref 1.2–2.2)
Alkaline Phosphatase: 62 IU/L (ref 39–117)
BUN / CREAT RATIO: 27 — AB (ref 9–23)
BUN: 20 mg/dL (ref 6–24)
Bilirubin Total: 0.2 mg/dL (ref 0.0–1.2)
CALCIUM: 9.7 mg/dL (ref 8.7–10.2)
CO2: 22 mmol/L (ref 20–29)
CREATININE: 0.75 mg/dL (ref 0.57–1.00)
Chloride: 95 mmol/L — ABNORMAL LOW (ref 96–106)
GFR calc non Af Amer: 96 mL/min/{1.73_m2} (ref >59)
GFR, EST AFRICAN AMERICAN: 111 mL/min/{1.73_m2} (ref >59)
GLUCOSE: 81 mg/dL (ref 65–99)
Globulin, Total: 2.1 g/dL (ref 1.5–4.5)
Potassium: 3 mmol/L — ABNORMAL LOW (ref 3.5–5.2)
Sodium: 138 mmol/L (ref 134–144)
TOTAL PROTEIN: 6.6 g/dL (ref 6.0–8.5)

## 2018-08-10 LAB — CBC WITH DIFFERENTIAL/PLATELET
Basophils Absolute: 0.1 10*3/uL (ref 0.0–0.2)
Basos: 1 %
EOS (ABSOLUTE): 1.1 10*3/uL — AB (ref 0.0–0.4)
Eos: 11 %
Hematocrit: 42.4 % (ref 34.0–46.6)
Hemoglobin: 14 g/dL (ref 11.1–15.9)
Immature Grans (Abs): 0 10*3/uL (ref 0.0–0.1)
Immature Granulocytes: 0 %
Lymphocytes Absolute: 2.1 10*3/uL (ref 0.7–3.1)
Lymphs: 21 %
MCH: 29.7 pg (ref 26.6–33.0)
MCHC: 33 g/dL (ref 31.5–35.7)
MCV: 90 fL (ref 79–97)
Monocytes Absolute: 0.7 10*3/uL (ref 0.1–0.9)
Monocytes: 7 %
Neutrophils Absolute: 5.8 10*3/uL (ref 1.4–7.0)
Neutrophils: 60 %
Platelets: 338 10*3/uL (ref 150–450)
RBC: 4.71 x10E6/uL (ref 3.77–5.28)
RDW: 12 % — ABNORMAL LOW (ref 12.3–15.4)
WBC: 9.7 10*3/uL (ref 3.4–10.8)

## 2018-08-10 LAB — LIPID PANEL
CHOLESTEROL TOTAL: 222 mg/dL — AB (ref 100–199)
Chol/HDL Ratio: 2.1 ratio (ref 0.0–4.4)
HDL: 106 mg/dL (ref >39)
LDL Calculated: 97 mg/dL (ref 0–99)
TRIGLYCERIDES: 93 mg/dL (ref 0–149)
VLDL Cholesterol Cal: 19 mg/dL (ref 5–40)

## 2018-08-10 LAB — T4: T4, Total: 8.6 ug/dL (ref 4.5–12.0)

## 2018-08-10 LAB — TSH: TSH: 3.43 u[IU]/mL (ref 0.450–4.500)

## 2018-08-10 LAB — MAGNESIUM: MAGNESIUM: 2.1 mg/dL (ref 1.6–2.3)

## 2018-08-10 LAB — VITAMIN D 25 HYDROXY (VIT D DEFICIENCY, FRACTURES): VIT D 25 HYDROXY: 16.8 ng/mL — AB (ref 30.0–100.0)

## 2018-09-11 ENCOUNTER — Other Ambulatory Visit: Payer: Self-pay | Admitting: Family Medicine

## 2018-09-11 NOTE — Addendum Note (Signed)
Addended by: [name] on: 09/11/2018 08:06 AM   Modules accepted: Orders

## 2018-09-20 ENCOUNTER — Other Ambulatory Visit: Payer: Self-pay

## 2018-09-20 DIAGNOSIS — E559 Vitamin D deficiency, unspecified: Secondary | ICD-10-CM | POA: Diagnosis not present

## 2018-09-20 DIAGNOSIS — E876 Hypokalemia: Secondary | ICD-10-CM | POA: Diagnosis not present

## 2018-09-20 DIAGNOSIS — R531 Weakness: Secondary | ICD-10-CM | POA: Diagnosis not present

## 2018-09-20 NOTE — Progress Notes (Signed)
Ok per Dennis.  

## 2018-09-21 LAB — POTASSIUM: Potassium: 2.6 mmol/L — ABNORMAL LOW (ref 3.5–5.2)

## 2018-09-21 LAB — MAGNESIUM: Magnesium: 2.3 mg/dL (ref 1.6–2.3)

## 2018-09-21 LAB — VITAMIN D 25 HYDROXY (VIT D DEFICIENCY, FRACTURES): Vit D, 25-Hydroxy: 22.1 ng/mL — ABNORMAL LOW (ref 30.0–100.0)

## 2018-09-24 ENCOUNTER — Telehealth: Payer: Self-pay | Admitting: *Deleted

## 2018-09-24 NOTE — Telephone Encounter (Signed)
Patient was notified.

## 2018-09-24 NOTE — Telephone Encounter (Signed)
LMOVM for pt to return call 

## 2018-09-24 NOTE — Telephone Encounter (Signed)
Pt Returned call ° °teri °

## 2018-09-24 NOTE — Telephone Encounter (Signed)
-----   Message from [place], [place] sent at 09/23/2018  5:48 PM EST ----- Vitamin-D still below normal but slightly better than a month ago. Magnesium is normal but potassium is much lower. Need to increase KCL 20 meq to one BID and use Vitamin-D 2000 IU BID. Recheck levels in a month.

## 2018-09-24 NOTE — Telephone Encounter (Signed)
Sent KCL 20 meq qd #90 & 3 RF to the [facility] on 09-11-18. Hopefully she won't need to increase this to BID more than a month (if she has been taking it BID regularly - increase to TID - #90 should last her the month before getting labs rechecked).

## 2018-09-24 NOTE — Telephone Encounter (Signed)
Patient was notified of results. Patient expressed understanding. Patient stated that the KCL instructions are the same as last tim. Do you want to continue tang 20 meq bid or increase that dose? Also patient needs a refill for KCL with the new instructions sent to [facility]. Please advise?

## 2018-10-01 ENCOUNTER — Other Ambulatory Visit: Payer: Self-pay | Admitting: *Deleted

## 2018-10-01 DIAGNOSIS — E876 Hypokalemia: Secondary | ICD-10-CM

## 2018-10-03 ENCOUNTER — Other Ambulatory Visit: Payer: Self-pay | Admitting: Family Medicine

## 2018-10-12 ENCOUNTER — Other Ambulatory Visit: Payer: Self-pay | Admitting: Family Medicine

## 2018-10-25 DIAGNOSIS — E876 Hypokalemia: Secondary | ICD-10-CM | POA: Diagnosis not present

## 2018-10-26 LAB — POTASSIUM: POTASSIUM: 3 mmol/L — AB (ref 3.5–5.2)

## 2018-10-28 ENCOUNTER — Telehealth: Payer: Self-pay | Admitting: *Deleted

## 2018-10-28 NOTE — Telephone Encounter (Signed)
-----   Message from Margo [place], [place] sent at 10/26/2018 12:24 PM EST ----- Potassium still low. Recommend increasing Klor-Con to 20 meq BID and recheck appointment in a month.

## 2018-10-28 NOTE — Telephone Encounter (Signed)
LMOVM for pt to return call 

## 2018-10-30 NOTE — Telephone Encounter (Signed)
Patient was advised she states that you told her previously when she got her last labs drawn in January to increase to Klor-Con 22meq to TID, I do not see that in telephone encounter I see that you had advised patient to go up to BID. Can you please review over previous labs to clarify? Patient also states that she will need a new prescription sent in. KW

## 2018-10-31 NOTE — Telephone Encounter (Signed)
Result note was written as BID in January. Looks like the level is starting to respond and should continue BID of the Klor-Con 20 meq now. Taking it TID is not a problem, just would need to recheck blood levels sooner to be sure not to over shoot the range.

## 2018-11-03 ENCOUNTER — Other Ambulatory Visit: Payer: Self-pay | Admitting: Family Medicine

## 2018-11-03 DIAGNOSIS — K219 Gastro-esophageal reflux disease without esophagitis: Secondary | ICD-10-CM

## 2018-11-04 NOTE — Telephone Encounter (Signed)
Pharmacy requesting refills. Thanks!  

## 2018-11-22 NOTE — Telephone Encounter (Signed)
Advised. KW 

## 2019-04-01 ENCOUNTER — Other Ambulatory Visit: Payer: Self-pay | Admitting: Family Medicine

## 2019-04-03 ENCOUNTER — Other Ambulatory Visit: Payer: Self-pay | Admitting: Family Medicine

## 2019-04-25 ENCOUNTER — Other Ambulatory Visit: Payer: Self-pay | Admitting: Family Medicine

## 2019-05-26 ENCOUNTER — Other Ambulatory Visit: Payer: Self-pay

## 2019-05-26 DIAGNOSIS — Z20828 Contact with and (suspected) exposure to other viral communicable diseases: Secondary | ICD-10-CM | POA: Diagnosis not present

## 2019-05-26 DIAGNOSIS — Z20822 Contact with and (suspected) exposure to covid-19: Secondary | ICD-10-CM

## 2019-05-27 LAB — NOVEL CORONAVIRUS, NAA: SARS-CoV-2, NAA: NOT DETECTED

## 2019-06-12 IMAGING — DX DG CHEST 1V PORT
1 series · 1 of 1 positions shown · non-contrast
Comparison: 04/06/2016

CLINICAL DATA: Shortness of breath

EXAM:
PORTABLE CHEST 1 VIEW

[chest ap]
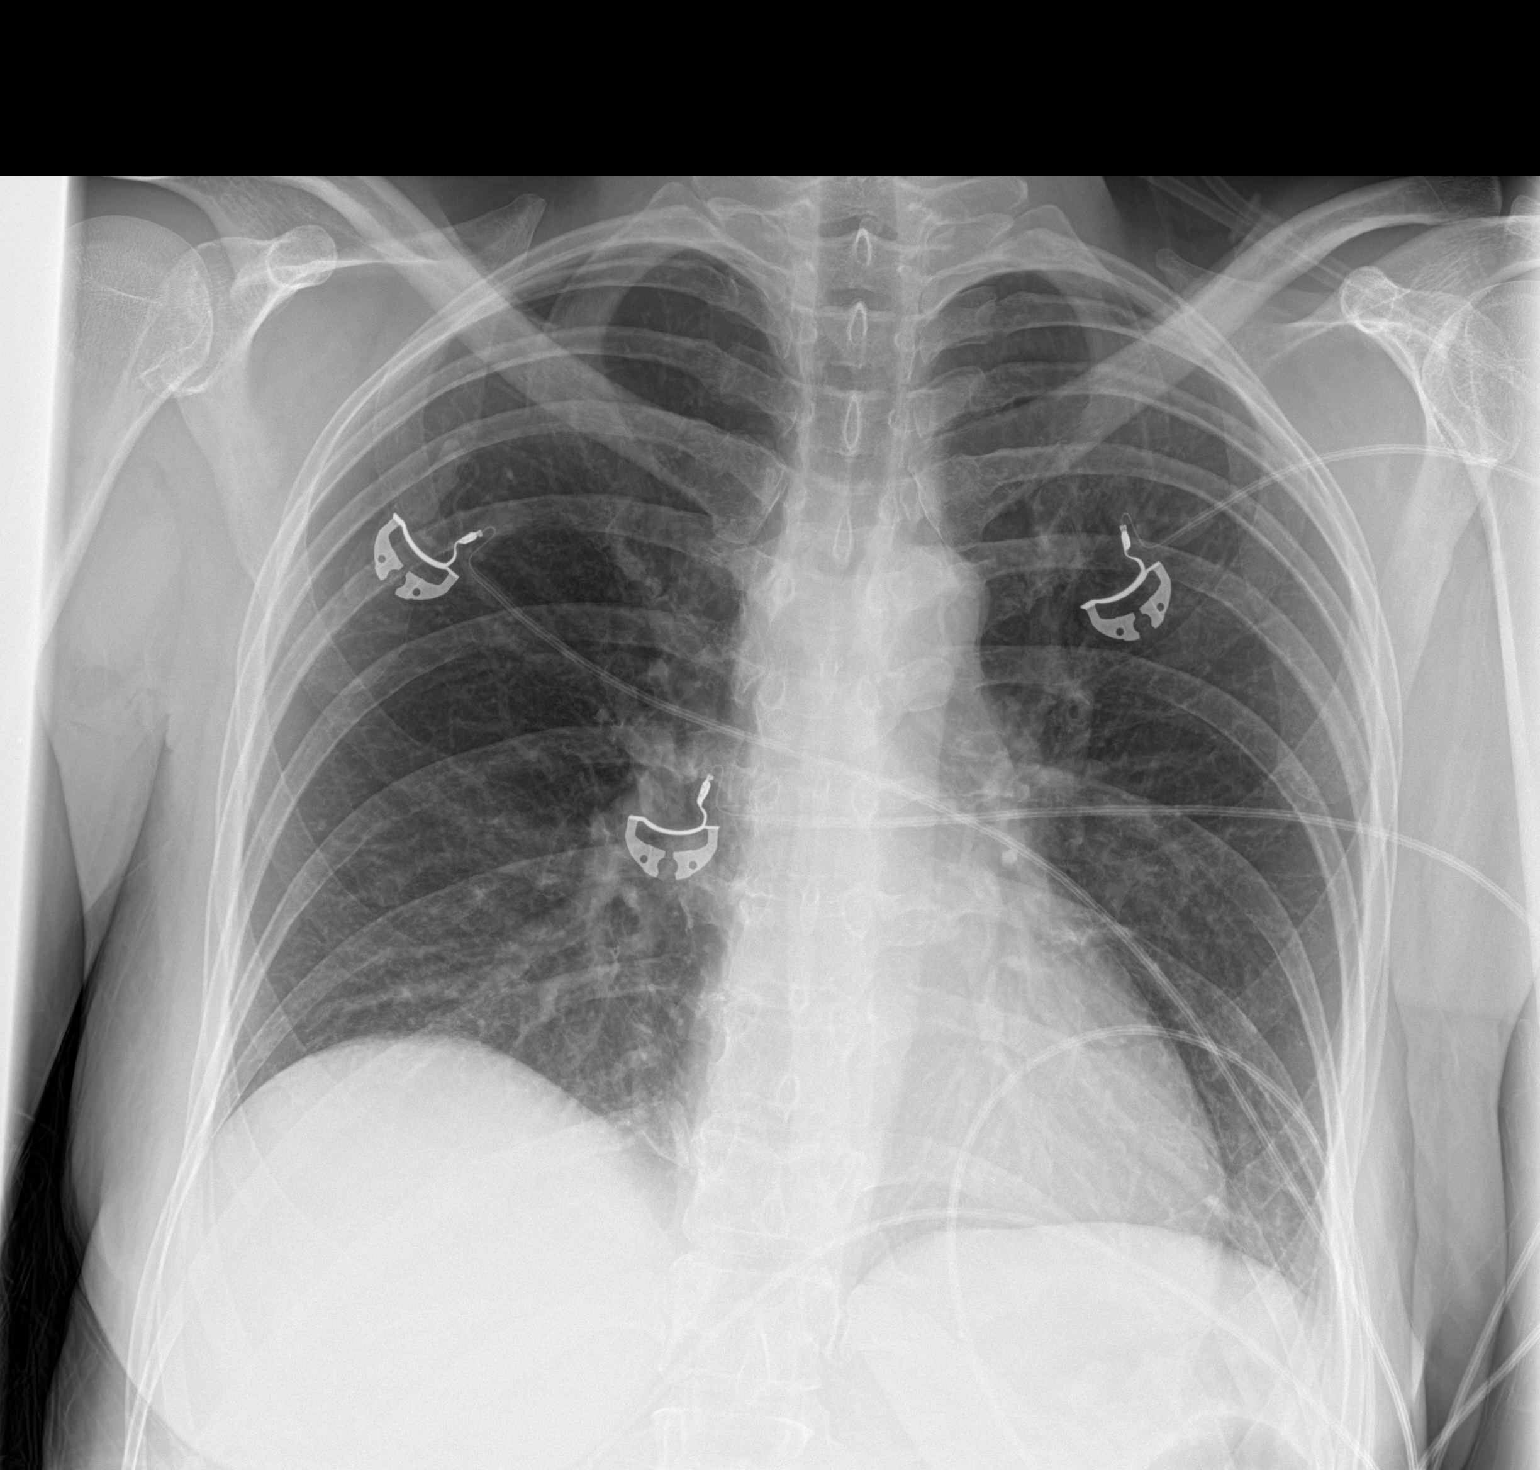

[1 of 1 positions shown; findings below may reference images not displayed]

FINDINGS: Normal heart size and mediastinal contours. No acute infiltrate or
edema. No effusion or pneumothorax. No acute osseous findings.
Artifact from EKG leads.
IMPRESSION: Negative portable chest

## 2019-06-16 ENCOUNTER — Telehealth: Payer: Self-pay

## 2019-06-16 DIAGNOSIS — E876 Hypokalemia: Secondary | ICD-10-CM

## 2019-06-16 DIAGNOSIS — E039 Hypothyroidism, unspecified: Secondary | ICD-10-CM

## 2019-06-16 DIAGNOSIS — E559 Vitamin D deficiency, unspecified: Secondary | ICD-10-CM

## 2019-06-16 NOTE — Telephone Encounter (Signed)
Pt is requesting a lab slip so she can come and get her labs done. Pt stated that she wants to be in the office as little as possible and she would like to schedule an e-visit after she has her labs done to discuss her lab results and this be her medication follow up visit. Please advise. Thanks TNP

## 2019-06-17 NOTE — Telephone Encounter (Signed)
Tried calling patient. Left message to call back. 

## 2019-06-17 NOTE — Telephone Encounter (Signed)
Please print order and leave at lab for her.

## 2019-06-17 NOTE — Telephone Encounter (Signed)
Patient advised. Lab slip left up front for pick up. Patient will schedule appointment for virtual office visit after she gets her labs done.

## 2019-06-19 DIAGNOSIS — E876 Hypokalemia: Secondary | ICD-10-CM | POA: Diagnosis not present

## 2019-06-19 DIAGNOSIS — E559 Vitamin D deficiency, unspecified: Secondary | ICD-10-CM | POA: Diagnosis not present

## 2019-06-19 DIAGNOSIS — E039 Hypothyroidism, unspecified: Secondary | ICD-10-CM | POA: Diagnosis not present

## 2019-06-20 LAB — RENAL FUNCTION PANEL
Albumin: 4.8 g/dL (ref 3.8–4.8)
BUN/Creatinine Ratio: 14 (ref 9–23)
BUN: 14 mg/dL (ref 6–24)
CO2: 24 mmol/L (ref 20–29)
Calcium: 9.5 mg/dL (ref 8.7–10.2)
Chloride: 98 mmol/L (ref 96–106)
Creatinine, Ser: 0.99 mg/dL (ref 0.57–1.00)
GFR calc Af Amer: 78 mL/min/{1.73_m2} (ref >59)
GFR calc non Af Amer: 68 mL/min/{1.73_m2} (ref >59)
Glucose: 90 mg/dL (ref 65–99)
Phosphorus: 3 mg/dL (ref 3.0–4.3)
Potassium: 2.9 mmol/L — ABNORMAL LOW (ref 3.5–5.2)
Sodium: 138 mmol/L (ref 134–144)

## 2019-06-20 LAB — VITAMIN D 25 HYDROXY (VIT D DEFICIENCY, FRACTURES): Vit D, 25-Hydroxy: 27.6 ng/mL — ABNORMAL LOW (ref 30.0–100.0)

## 2019-06-20 LAB — TSH: TSH: 4.06 u[IU]/mL (ref 0.450–4.500)

## 2019-06-20 LAB — MAGNESIUM: Magnesium: 2 mg/dL (ref 1.6–2.3)

## 2019-06-30 ENCOUNTER — Telehealth: Payer: Self-pay | Admitting: Family Medicine

## 2019-06-30 NOTE — Telephone Encounter (Signed)
Pt needing a call back to let her know if she needs to keep the up coming appt with Dr. Caryn Section.  She already has had her CPE and is not having issues.  Please call pt back at [phone] to let her know.  Thanks, [employer]

## 2019-07-01 NOTE — Telephone Encounter (Signed)
Patient advised that she will need a follow up appointment. It has been over 1 year since she has been seen. Appointment scheduled as a doxy.me on 12/9 per patient's request.

## 2019-07-30 ENCOUNTER — Encounter: Payer: Self-pay | Admitting: Family Medicine

## 2019-07-30 ENCOUNTER — Ambulatory Visit (INDEPENDENT_AMBULATORY_CARE_PROVIDER_SITE_OTHER): Payer: BLUE CROSS/BLUE SHIELD | Admitting: Family Medicine

## 2019-07-30 DIAGNOSIS — E559 Vitamin D deficiency, unspecified: Secondary | ICD-10-CM

## 2019-07-30 DIAGNOSIS — E876 Hypokalemia: Secondary | ICD-10-CM

## 2019-07-30 DIAGNOSIS — E039 Hypothyroidism, unspecified: Secondary | ICD-10-CM

## 2019-07-30 MED ORDER — LEVOTHYROXINE SODIUM 75 MCG PO TABS: 75.0000 ug | ORAL_TABLET | Freq: Every day | ORAL | 1 refills | Status: DC

## 2019-07-30 MED ORDER — VITAMIN D3 25 MCG (1000 UT) PO CAPS: 1.0000 | ORAL_CAPSULE | Freq: Every day | ORAL | Status: DC

## 2019-07-30 MED ORDER — POTASSIUM CHLORIDE CRYS ER 20 MEQ PO TBCR: 20.0000 meq | EXTENDED_RELEASE_TABLET | Freq: Two times a day (BID) | ORAL | 3 refills | Status: DC

## 2019-07-30 NOTE — Patient Instructions (Addendum)
.   Please start OTC Vitamin D3 1000 units once a day  . Increase Potassium Chloride to 24mEQ twice a day  . [facility] [place] will need to have potassium, TSH, and Vitamin D 23-OH levels check in 3-4 weeks. You should get a call from our office when it is time.

## 2019-07-30 NOTE — Progress Notes (Signed)
Patient: Kayla Curry Female    DOB: 08/23/1970   48 y.o.   MRN: [id number] Visit Date: 07/30/2019  Today's Provider: Lelon Huh, MD   Chief Complaint  Patient presents with  . Hypothyroidism  . Gitelman Syndrome   Subjective:    Virtual Visit via Video Note  I connected with Kayla Curry on 07/30/19 at  9:40 AM EST by a video enabled telemedicine application and verified that I am speaking with the correct person using two identifiers.  Location: Patient: home Provider: [facility]   I discussed the limitations of evaluation and management by telemedicine and the availability of in person appointments. The patient expressed understanding and agreed to proceed.  Interactive audio and video communications were attempted, although failed due to patient's inability to connect to video. Continued visit with audio only interaction with patient agreement.  HPI  Hypothyroid, follow-up:  TSH  Date Value Ref Range Status  06/19/2019 4.060 0.450 - 4.500 uIU/mL Final  08/09/2018 3.430 0.450 - 4.500 uIU/mL Final  05/10/2018 1.490 0.450 - 4.500 uIU/mL Final   Wt Readings from Last 3 Encounters:  06/28/18 126 lb 12.8 oz (57.5 kg)  05/31/18 127 lb (57.6 kg)  05/17/18 122 lb (55.3 kg)    She was last seen for hypothyroid more than 1 years ago.  Management since that visit includes none. She reports good compliance with treatment. She is not having side effects.  She is exercising. She is experiencing none She denies change in energy level, diarrhea, heat / cold intolerance, nervousness, palpitations and weight changes Weight trend: stable  ------------------------------------------------------------------------  Follow up for gitelman syndrome:  The patient was last seen for this more 1 years ago. Changes made at last visit include none.  She reports good compliance with treatment. She feels that condition is stable. She is not having side effects.  She had been  taking 2 potassium chloride daily until about March since her prescription was only for one daily She had been on 100-200mg  spironolactone until around 2018, but was discontinue due to hypotension. Last potassium check was 2.9 on 06/19/2019  ------------------------------------------------------------------------------------  She was also noted to have low vitamin d of 27.6 on 06/19/2019  Allergies  Allergen Reactions  . Advair Diskus [Fluticasone-Salmeterol] Shortness Of Breath  . Albuterol Sulfate Shortness Of Breath  . Atrovent Shortness Of Breath  . Bethanechol Shortness Of Breath, Rash and Other (See Comments)    RESPIRATORY DISTRESS  . Budesonide-Formoterol Fumarate Shortness Of Breath  . Influenza Vac Split Quad Anaphylaxis  . Influenza Vaccines Anaphylaxis and Shortness Of Breath  . Ipratropium Shortness Of Breath  . Ondansetron Rash  . Shellfish Allergy Anaphylaxis and Shortness Of Breath  . Xopenex [Levalbuterol] Shortness Of Breath  . Compazine Other (See Comments)    DYSTONIC  . Fluoride Preparations   . Lorazepam Other (See Comments)    "HAS OPPOSITE EFFECT"  . Levofloxacin Rash  . Other Rash    DYSTONIC  . Potassium-Containing Compounds Nausea And Vomiting    Just the po potassium  No problem with iv  . Propofol Nausea And Vomiting and Rash  . Zofran [Ondansetron Hcl] Rash     Current Outpatient Medications:  .  baclofen (LIORESAL) 20 MG tablet, TAKE 1 TABLET (20 MG TOTAL) BY MOUTH 4 (FOUR) TIMES DAILY., Disp: 360 tablet, Rfl: 1 .  celecoxib (CELEBREX) 100 MG capsule, TAKE 1 CAPSULE BY MOUTH TWICE A DAY, Disp: 180 capsule, Rfl: 3 .  EPINEPHrine (EPIPEN  2-PAK) 0.3 mg/0.3 mL IJ SOAJ injection, Inject into the muscle., Disp: , Rfl:  .  esomeprazole (NEXIUM) 40 MG capsule, TAKE 1 CAPSULE BY MOUTH EVERY DAY BEFORE BREAKFAST, Disp: 90 capsule, Rfl: 3 .  KLOR-CON M20 20 MEQ tablet, TAKE 1 TABLET BY MOUTH EVERY DAY, Disp: 90 tablet, Rfl: 3 .  magnesium oxide (MAG-OX)  400 (241.3 Mg) MG tablet, Take 1 tablet (400 mg total) by mouth 2 (two) times daily. (Patient taking differently: Take 400 mg by mouth daily. ), Disp: 60 tablet, Rfl: 0 .  metaproterenol (ALUPENT) 20 MG tablet, Take 1 tablet (20 mg total) by mouth 3 (three) times daily as needed., Disp: 60 tablet, Rfl: 3 .  promethazine (PHENERGAN) 25 MG tablet, TAKE 1 TABLET BY MOUTH EVERY 6 HOURS AS NEEDED FOR NAUSEA, Disp: 60 tablet, Rfl: 5 .  SYNTHROID 75 MCG tablet, TAKE 1 TABLET BY MOUTH DAILY. PLEASE SCHEDULE OFFICE VISIT FOR FOLLOWUP, Disp: 90 tablet, Rfl: 1 .  tiZANidine (ZANAFLEX) 4 MG tablet, TAKE 1 TABLET (4 MG TOTAL) BY MOUTH 2 (TWO) TIMES DAILY., Disp: 180 tablet, Rfl: 4 .  calcium carbonate (TUMS - DOSED IN MG ELEMENTAL CALCIUM) 500 MG chewable tablet, Chew 2.5 tablets (500 mg of elemental calcium total) by mouth 2 (two) times daily with a meal. (Patient not taking: Reported on 07/30/2019), Disp: 60 tablet, Rfl: 1  Review of Systems  Constitutional: Negative.   Respiratory: Negative.   Cardiovascular: Negative.   Musculoskeletal: Negative.     Social History   Tobacco Use  . Smoking status: Never Smoker  . Smokeless tobacco: Never Used  Substance Use Topics  . Alcohol use: No      Objective:     Physical Exam   Awake, alert, oriented x 3.  No results found for any visits on 07/30/19.     Assessment & Plan     1. Gitelman syndrome   2. Hypokalemia She prefers to go back up to BID on potassium supplements over restarting spironolactone which was previously stopped due to hypotension. New prescription sent and will check potassium levels in about 3 weeks.   3. Vitamin D deficiency Start OTC vitamin D3 1000 units daily and recheck levels in 3 weeks.   4. Hypothyroidism, unspecified type She states brand name synthroid is too expensive. Will change to generic and check tsh in about 3 weeks.     I discussed the assessment and treatment plan with the patient. The patient was  provided an opportunity to ask questions and all were answered. The patient agreed with the plan and demonstrated an understanding of the instructions.   The patient was advised to call back or seek an in-person evaluation if the symptoms worsen or if the condition fails to improve as anticipated.  I provided 12 minutes of non-face-to-face time during this encounter.     Lelon Huh, MD  [facility]

## 2019-08-17 ENCOUNTER — Other Ambulatory Visit: Payer: Self-pay | Admitting: Family Medicine

## 2019-08-21 ENCOUNTER — Telehealth (INDEPENDENT_AMBULATORY_CARE_PROVIDER_SITE_OTHER): Payer: Self-pay | Admitting: Family Medicine

## 2019-08-21 VITALS — Temp 99.8°F | Ht 62.0 in | Wt 126.0 lb

## 2019-08-21 DIAGNOSIS — J45909 Unspecified asthma, uncomplicated: Secondary | ICD-10-CM

## 2019-08-21 DIAGNOSIS — U071 COVID-19: Secondary | ICD-10-CM

## 2019-08-21 DIAGNOSIS — R059 Cough, unspecified: Secondary | ICD-10-CM

## 2019-08-21 DIAGNOSIS — R05 Cough: Secondary | ICD-10-CM

## 2019-08-21 MED ORDER — PREDNISONE 10 MG (21) PO TBPK: ORAL_TABLET | ORAL | 0 refills | Status: DC

## 2019-08-21 MED ORDER — HYDROCODONE-HOMATROPINE 5-1.5 MG/5ML PO SYRP: 5.0000 mL | ORAL_SOLUTION | Freq: Four times a day (QID) | ORAL | 0 refills | Status: DC | PRN

## 2019-08-21 NOTE — Progress Notes (Signed)
Patient: Kayla Curry Female    DOB: Feb 04, 1971   48 y.o.   MRN: [id number] Visit Date: 08/21/2019  Today's Provider: Wilhemena Durie, MD   Chief Complaint  Patient presents with  . Cough  . Nasal Congestion   Subjective:    Virtual Visit via Video Note  I connected with [name] on 08/21/19 at  9:00 AM EST by a video enabled telemedicine application and verified that I am speaking with the correct person using two identifiers.  Location: Patient: Home Provider: Office   I discussed the limitations of evaluation and management by telemedicine and the availability of in person appointments. The patient expressed understanding and agreed to proceed.  Tested positive for covid 08/06/2019 Clinically patient is feeling a little bit better and was cleared from quarantine on 1227.  Her history she says she has a history of asthma but is intolerant to most of the inhalers.  She has been tolerant of steroids in the past.  The main complaint she has today is 1 of mild hacking cough.  She has no real shortness of breath or wheezing but is coughing a lot the last few days.  She has no other symptoms.  She has fatigue but overall she continues to slowly improve from the Covid infection. Cough This is a new problem. The current episode started 1 to 4 weeks ago. The problem has been unchanged. The problem occurs every few minutes. The cough is non-productive. Associated symptoms include a fever, headaches, myalgias, nasal congestion, postnasal drip and shortness of breath. Pertinent negatives include no chest pain, ear congestion, ear pain, rhinorrhea, sore throat or wheezing. The symptoms are aggravated by lying down. She has tried OTC cough suppressant and rest for the symptoms. Her past medical history is significant for asthma.  Sinusitis This is a new problem. The current episode started in the past 7 days. The problem is unchanged. There has been no fever. The pain is mild.  Associated symptoms include congestion, coughing, headaches, a hoarse voice, shortness of breath and sinus pressure. Pertinent negatives include no ear pain or sore throat. Past treatments include oral decongestants and acetaminophen. The treatment provided mild relief.    Allergies  Allergen Reactions  . Advair Diskus [Fluticasone-Salmeterol] Shortness Of Breath  . Albuterol Sulfate Shortness Of Breath  . Atrovent Shortness Of Breath  . Bethanechol Shortness Of Breath, Rash and Other (See Comments)    RESPIRATORY DISTRESS  . Budesonide-Formoterol Fumarate Shortness Of Breath  . Influenza Vac Split Quad Anaphylaxis  . Influenza Vaccines Anaphylaxis and Shortness Of Breath  . Ipratropium Shortness Of Breath  . Ondansetron Rash  . Shellfish Allergy Anaphylaxis and Shortness Of Breath  . Xopenex [Levalbuterol] Shortness Of Breath  . Compazine Other (See Comments)    DYSTONIC  . Fluoride Preparations   . Lorazepam Other (See Comments)    "HAS OPPOSITE EFFECT"  . Levofloxacin Rash  . Other Rash    DYSTONIC  . Potassium-Containing Compounds Nausea And Vomiting    Just the po potassium  No problem with iv  . Propofol Nausea And Vomiting and Rash  . Zofran [Ondansetron Hcl] Rash     Current Outpatient Medications:  .  baclofen (LIORESAL) 20 MG tablet, TAKE 1 TABLET (20 MG TOTAL) BY MOUTH 4 (FOUR) TIMES DAILY., Disp: 360 tablet, Rfl: 1 .  celecoxib (CELEBREX) 100 MG capsule, TAKE 1 CAPSULE BY MOUTH TWICE A DAY, Disp: 180 capsule, Rfl: 3 .  Cholecalciferol (  VITAMIN D3) 25 MCG (1000 UT) CAPS, Take 1 capsule (1,000 Units total) by mouth daily., Disp: , Rfl:  .  EPINEPHrine (EPIPEN 2-PAK) 0.3 mg/0.3 mL IJ SOAJ injection, Inject into the muscle., Disp: , Rfl:  .  esomeprazole (NEXIUM) 40 MG capsule, TAKE 1 CAPSULE BY MOUTH EVERY DAY BEFORE BREAKFAST, Disp: 90 capsule, Rfl: 3 .  levothyroxine (SYNTHROID) 75 MCG tablet, Take 1 tablet (75 mcg total) by mouth daily., Disp: 90 tablet, Rfl: 1 .   magnesium oxide (MAG-OX) 400 (241.3 Mg) MG tablet, Take 1 tablet (400 mg total) by mouth 2 (two) times daily. (Patient taking differently: Take 400 mg by mouth daily. ), Disp: 60 tablet, Rfl: 0 .  metaproterenol (ALUPENT) 20 MG tablet, Take 1 tablet (20 mg total) by mouth 3 (three) times daily as needed., Disp: 60 tablet, Rfl: 3 .  potassium chloride SA (KLOR-CON M20) 20 MEQ tablet, Take 1 tablet (20 mEq total) by mouth 2 (two) times daily., Disp: 180 tablet, Rfl: 3 .  promethazine (PHENERGAN) 25 MG tablet, TAKE 1 TABLET BY MOUTH EVERY 6 HOURS AS NEEDED FOR NAUSEA, Disp: 60 tablet, Rfl: 5 .  tiZANidine (ZANAFLEX) 4 MG tablet, TAKE 1 TABLET (4 MG TOTAL) BY MOUTH 2 (TWO) TIMES DAILY., Disp: 180 tablet, Rfl: 4  Review of Systems  Constitutional: Positive for fever.  HENT: Positive for congestion, hoarse voice, postnasal drip and sinus pressure. Negative for ear pain, rhinorrhea and sore throat.   Eyes: Negative.   Respiratory: Positive for cough and shortness of breath. Negative for wheezing.   Cardiovascular: Negative for chest pain.  Gastrointestinal: Negative.   Endocrine: Negative.   Genitourinary: Negative.   Musculoskeletal: Positive for myalgias.  Skin: Negative.   Allergic/Immunologic: Negative.   Neurological: Positive for headaches.  Hematological: Negative.   Psychiatric/Behavioral: Negative.     Social History   Tobacco Use  . Smoking status: Never Smoker  . Smokeless tobacco: Never Used  Substance Use Topics  . Alcohol use: No      Objective:   There were no vitals taken for this visit. There were no vitals filed for this visit.There is no height or weight on file to calculate BMI.   Physical Exam   No results found for any visits on 08/21/19.     Assessment & Plan     1. COVID-19 virus infection Released from quarantine August 17, 2019 - predniSONE (STERAPRED UNI-PAK 21 TAB) 10 MG (21) TBPK tablet; 6-5-4-3-2-1  Dispense: 21 tablet; Refill: 0  2.  Extrinsic asthma without complication, unspecified asthma severity, unspecified whether persistent Add prednisone taper, I think this is probably all she will need.  She has tolerated this in the past but is still thighs of common side effects - predniSONE (STERAPRED UNI-PAK 21 TAB) 10 MG (21) TBPK tablet; 6-5-4-3-2-1  Dispense: 21 tablet; Refill: 0  3. Cough Add narcotic cough medicine to allow her to get some sleep.  She is having cough but no orthopnea.  Breathing is fine. - HYDROcodone-homatropine (HYCODAN) 5-1.5 MG/5ML syrup; Take 5 mLs by mouth every 6 (six) hours as needed for cough.  Dispense: 120 mL; Refill: 0  I discussed the assessment and treatment plan with the patient. The patient was provided an opportunity to ask questions and all were answered. The patient agreed with the plan and demonstrated an understanding of the instructions.   The patient was advised to call back or seek an in-person evaluation if the symptoms worsen or if the condition Curry to improve  as anticipated.  I provided 15 minutes of non-face-to-face time during this encounter.     Richard Cranford Mon, MD  [facility]

## 2019-09-07 ENCOUNTER — Other Ambulatory Visit: Payer: Self-pay | Admitting: Family Medicine

## 2019-09-25 ENCOUNTER — Other Ambulatory Visit: Payer: Self-pay | Admitting: Family Medicine

## 2019-09-25 NOTE — Telephone Encounter (Signed)
Notes to clinic:  This med is not able to be delegated.    Requested Prescriptions  Pending Prescriptions Disp Refills   baclofen (LIORESAL) 20 MG tablet [Pharmacy Med Name: BACLOFEN 20 MG TABLET] 360 tablet 1    Sig: TAKE 1 TABLET (20 MG TOTAL) BY MOUTH 4 (FOUR) TIMES DAILY.      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

## 2019-10-26 ENCOUNTER — Other Ambulatory Visit: Payer: Self-pay | Admitting: Family Medicine

## 2019-10-26 DIAGNOSIS — K219 Gastro-esophageal reflux disease without esophagitis: Secondary | ICD-10-CM

## 2019-11-05 ENCOUNTER — Telehealth: Payer: Self-pay

## 2019-11-05 DIAGNOSIS — E559 Vitamin D deficiency, unspecified: Secondary | ICD-10-CM

## 2019-11-05 DIAGNOSIS — E876 Hypokalemia: Secondary | ICD-10-CM

## 2019-11-05 DIAGNOSIS — E039 Hypothyroidism, unspecified: Secondary | ICD-10-CM

## 2019-11-05 NOTE — Telephone Encounter (Addendum)
Per office note from 07/30/2019 it states  That patient will need to have potassium, TSH, and Vitamin D 23-OH levels check in 3-4 weeks. Please advise if ok to order labs since it is passed the time frame of when labs were supposed to be rechecked.  Lab orders have been pulled and pended.

## 2019-11-05 NOTE — Telephone Encounter (Signed)
Patient advised. Lab orders signed off on.

## 2019-11-05 NOTE — Telephone Encounter (Signed)
Yes, please order. Thanks.

## 2019-11-05 NOTE — Addendum Note (Signed)
Addended by: Randal Buba on: 11/05/2019 02:17 PM   Modules accepted: Orders

## 2019-11-05 NOTE — Telephone Encounter (Signed)
Copied from [place] [id number]. Topic: General - Other >> Nov 05, 2019  9:53 AM Yvette Rack wrote: Reason for CRM: Pt stated she was suppose to receive a call 1 month after her last visit to schedule an appt for labs. Pt would like labs ordered so she can go ahead and have labs drawn. Pt requests call back

## 2019-11-05 NOTE — Addendum Note (Signed)
Addended by: Randal Buba on: 11/05/2019 12:07 PM   Modules accepted: Orders

## 2019-11-10 DIAGNOSIS — E876 Hypokalemia: Secondary | ICD-10-CM | POA: Diagnosis not present

## 2019-11-10 DIAGNOSIS — E559 Vitamin D deficiency, unspecified: Secondary | ICD-10-CM | POA: Diagnosis not present

## 2019-11-10 DIAGNOSIS — E039 Hypothyroidism, unspecified: Secondary | ICD-10-CM | POA: Diagnosis not present

## 2019-11-11 LAB — VITAMIN D 25 HYDROXY (VIT D DEFICIENCY, FRACTURES): Vit D, 25-Hydroxy: 33.3 ng/mL (ref 30.0–100.0)

## 2019-11-11 LAB — POTASSIUM: Potassium: 2.5 mmol/L — ABNORMAL LOW (ref 3.5–5.2)

## 2019-11-11 LAB — TSH: TSH: 2.99 u[IU]/mL (ref 0.450–4.500)

## 2019-11-12 ENCOUNTER — Other Ambulatory Visit: Payer: Self-pay | Admitting: Family Medicine

## 2019-11-12 ENCOUNTER — Encounter: Payer: Self-pay | Admitting: Family Medicine

## 2019-11-12 DIAGNOSIS — E876 Hypokalemia: Secondary | ICD-10-CM

## 2019-11-29 ENCOUNTER — Other Ambulatory Visit: Payer: Self-pay | Admitting: Family Medicine

## 2019-11-29 NOTE — Telephone Encounter (Signed)
Requested  medications are  due for refill today yes  Requested medications are on the active medication list yes  Last refill 1/12  Future visit scheduled no  Notes to clinic: did not see visit that addressed Celebrex within the protocol of 12 months visit

## 2019-12-22 ENCOUNTER — Other Ambulatory Visit: Payer: Self-pay | Admitting: Family Medicine

## 2019-12-22 NOTE — Telephone Encounter (Signed)
Requested Prescriptions  Pending Prescriptions Disp Refills  . levothyroxine (SYNTHROID) 75 MCG tablet [Pharmacy Med Name: LEVOTHYROXINE 75 MCG TABLET] 30 tablet 5    Sig: TAKE 1 TABLET BY MOUTH DAILY. PLEASE SCHEDULE OFFICE VISIT FOR FOLLOWUP     Endocrinology:  Hypothyroid Agents Failed - 12/22/2019  1:34 AM      Failed - TSH needs to be rechecked within 3 months after an abnormal result. Refill until TSH is due.      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

## 2020-01-11 ENCOUNTER — Other Ambulatory Visit: Payer: Self-pay | Admitting: Family Medicine

## 2020-01-11 NOTE — Telephone Encounter (Signed)
Requested medication (s) are due for refill today: yes  Requested medication (s) are on the active medication list: yes  Last refill:  08/17/19  Future visit scheduled: no  Notes to clinic:  medication not delegated to NT to RF   Requested Prescriptions  Pending Prescriptions Disp Refills   promethazine (PHENERGAN) 25 MG tablet [Pharmacy Med Name: PROMETHAZINE 25 MG TABLET] 60 tablet 5    Sig: TAKE 1 TABLET BY MOUTH EVERY 6 HOURS AS NEEDED FOR NAUSEA      Not Delegated - Gastroenterology: Antiemetics Failed - 01/11/2020  3:23 PM      Failed - This refill cannot be delegated      Passed - Valid encounter within last 6 months    Recent Outpatient Visits           4 months ago COVID-19 virus infection   [facility] Jerrol Banana., MD   5 months ago Gitelman syndrome   [facility] [name], MD   1 year ago Persistent cough for 3 weeks or longer   [place], [place]   1 year ago Asthmatic bronchitis, mild intermittent, with acute exacerbation   [place], Vickki Muff, [place]   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

## 2020-01-14 ENCOUNTER — Other Ambulatory Visit: Payer: Self-pay

## 2020-01-14 DIAGNOSIS — E876 Hypokalemia: Secondary | ICD-10-CM | POA: Diagnosis not present

## 2020-01-15 LAB — POTASSIUM: Potassium: 3.1 mmol/L — ABNORMAL LOW (ref 3.5–5.2)

## 2020-03-22 ENCOUNTER — Other Ambulatory Visit: Payer: Self-pay | Admitting: Family Medicine

## 2020-03-22 NOTE — Telephone Encounter (Signed)
Requested medication (s) are due for refill today: yes  Requested medication (s) are on the active medication list: yes  Last refill:  12/22/2019  Future visit scheduled: no  Notes to clinic:  due for labs   Requested Prescriptions  Pending Prescriptions Disp Refills   levothyroxine (SYNTHROID) 75 MCG tablet [Pharmacy Med Name: LEVOTHYROXINE 75 MCG TABLET] 90 tablet 0    Sig: TAKE 1 TABLET BY MOUTH DAILY. PLEASE SCHEDULE OFFICE VISIT FOR FOLLOWUP      Endocrinology:  Hypothyroid Agents Failed - 03/22/2020  1:35 AM      Failed - TSH needs to be rechecked within 3 months after an abnormal result. Refill until TSH is due.      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

## 2020-03-25 ENCOUNTER — Other Ambulatory Visit: Payer: Self-pay | Admitting: Family Medicine

## 2020-03-25 NOTE — Telephone Encounter (Signed)
Requested  medications are  due for refill today yes  Requested medications are on the active medication list yes  Last refill 5/8  Last visit Dec 2020  Notes to clinic Not Delegated.

## 2020-06-06 ENCOUNTER — Other Ambulatory Visit: Payer: Self-pay | Admitting: Family Medicine

## 2020-06-07 NOTE — Telephone Encounter (Signed)
Requested medication (s) are due for refill today: no  Requested medication (s) are on the active medication list: yes   Last refill:  05/16/2020  Future visit scheduled: yes   Notes to clinic:  this refill cannot be delegated    Requested Prescriptions  Pending Prescriptions Disp Refills   promethazine (PHENERGAN) 25 MG tablet [Pharmacy Med Name: PROMETHAZINE 25 MG TABLET] 60 tablet 5    Sig: TAKE 1 TABLET BY MOUTH EVERY 6 HOURS AS NEEDED FOR NAUSEA      Not Delegated - Gastroenterology: Antiemetics Failed - 06/06/2020  7:25 PM      Failed - This refill cannot be delegated      Failed - Valid encounter within last 6 months    Recent Outpatient Visits           9 months ago COVID-19 virus infection   [facility] Jerrol Banana., MD   10 months ago Gitelman syndrome   [facility] [name], MD   1 year ago Persistent cough for 3 weeks or longer   [place], [place]   2 years ago Asthmatic bronchitis, mild intermittent, with acute exacerbation   [employer], Vickki Muff, [place]   2 years ago Electra, Donald E, MD       Future Appointments             In 2 days Fisher, Kirstie Peri, MD [facility], [place]

## 2020-06-08 ENCOUNTER — Other Ambulatory Visit: Payer: Self-pay | Admitting: Family Medicine

## 2020-06-09 ENCOUNTER — Ambulatory Visit: Payer: BC Managed Care – PPO | Admitting: Family Medicine

## 2020-06-09 ENCOUNTER — Other Ambulatory Visit: Payer: Self-pay

## 2020-06-09 ENCOUNTER — Encounter: Payer: Self-pay | Admitting: Family Medicine

## 2020-06-09 VITALS — BP 152/70 | HR 79 | Temp 98.2°F | Resp 16 | Wt 118.0 lb

## 2020-06-09 DIAGNOSIS — E559 Vitamin D deficiency, unspecified: Secondary | ICD-10-CM | POA: Diagnosis not present

## 2020-06-09 DIAGNOSIS — E039 Hypothyroidism, unspecified: Secondary | ICD-10-CM | POA: Diagnosis not present

## 2020-06-09 DIAGNOSIS — Z887 Allergy status to serum and vaccine status: Secondary | ICD-10-CM

## 2020-06-09 DIAGNOSIS — E876 Hypokalemia: Secondary | ICD-10-CM | POA: Diagnosis not present

## 2020-06-09 MED ORDER — POTASSIUM CHLORIDE CRYS ER 20 MEQ PO TBCR: 20.0000 meq | EXTENDED_RELEASE_TABLET | Freq: Three times a day (TID) | ORAL | 3 refills | Status: DC

## 2020-06-09 NOTE — Progress Notes (Signed)
Established patient visit   Patient: Kayla Curry   DOB: [birth year]   49 y.o. Female  MRN: [id number] Visit Date: 06/09/2020  Today's healthcare provider: Lelon Huh, MD   Chief Complaint  Patient presents with  . Hypothyroidism   Subjective    HPI  Follow up for Hypokalemia/Gitelman syndrome:  The patient was last seen for this 7 months ago. Management during the last visit includes recommend starting Spironolactone. Patient declined and preferred to increase potassium supplement to three times daily.  She had been taking three a day, but never got prescription with new directions, so she recently ran short and has only been taking two tablet daily for the last couple of weeks.  She reports good compliance with treatment. She feels that condition is Unchanged. She is not having side effects.   She had previously been on spironolactone to maintain potassium level, but stopped due to hypotension. She reports her home blood pressure continue to run low, around 683 systolic, but she is tolerating TID potassium supplements.   -----------------------------------------------------------------------------------------  Follow up for Vitamin D Deficiency:  The patient was last seen for this 10 months ago. Changes made at last visit include starting OTC vitamin D3 1000 units daily and recheck levels in 3 weeks.  She reports good compliance with treatment. She feels that condition is Unchanged. She is not having side effects.   -----------------------------------------------------------------------------------------  Hypothyroid, follow-up  Lab Results  Component Value Date   TSH 2.990 11/10/2019   TSH 4.060 06/19/2019   TSH 3.430 08/09/2018   FREET4 1.0 06/15/2017   T4TOTAL 8.6 08/09/2018   Wt Readings from Last 3 Encounters:  06/09/20 118 lb (53.5 kg)  08/21/19 126 lb (57.2 kg)  06/28/18 126 lb 12.8 oz (57.5 kg)    She was last seen for hypothyroid 10 months  ago.  Management since that visit includes changing from brand name Synthroid to generic due to cost. She reports good compliance with treatment. She is not having side effects.   Symptoms: No change in energy level No constipation  No diarrhea No heat / cold intolerance  No nervousness No palpitations  No weight changes    -----------------------------------------------------------------------------------------    Medications: Outpatient Medications Prior to Visit  Medication Sig  . baclofen (LIORESAL) 20 MG tablet TAKE 1 TABLET (20 MG TOTAL) BY MOUTH 4 (FOUR) TIMES DAILY.  . celecoxib (CELEBREX) 100 MG capsule TAKE 1 CAPSULE BY MOUTH TWICE A DAY  . Cholecalciferol (VITAMIN D3) 25 MCG (1000 UT) CAPS Take 1 capsule (1,000 Units total) by mouth daily.  Marland Kitchen EPINEPHrine (EPIPEN 2-PAK) 0.3 mg/0.3 mL IJ SOAJ injection Inject into the muscle.  . esomeprazole (NEXIUM) 40 MG capsule TAKE 1 CAPSULE BY MOUTH EVERY DAY BEFORE BREAKFAST  . levothyroxine (SYNTHROID) 75 MCG tablet TAKE 1 TABLET BY MOUTH DAILY. PLEASE SCHEDULE OFFICE VISIT FOR FOLLOWUP  . magnesium oxide (MAG-OX) 400 (241.3 Mg) MG tablet Take 1 tablet (400 mg total) by mouth 2 (two) times daily. (Patient taking differently: Take 400 mg by mouth daily. )  . metaproterenol (ALUPENT) 20 MG tablet Take 1 tablet (20 mg total) by mouth 3 (three) times daily as needed.  . potassium chloride SA (KLOR-CON M20) 20 MEQ tablet Take 1 tablet (20 mEq total) by mouth 2 (two) times daily. (Patient taking differently: Take 20 mEq by mouth 3 (three) times daily. )  . promethazine (PHENERGAN) 25 MG tablet TAKE 1 TABLET BY MOUTH EVERY 6 HOURS AS NEEDED FOR NAUSEA  .  tiZANidine (ZANAFLEX) 4 MG tablet TAKE 1 TABLET (4 MG TOTAL) BY MOUTH 2 (TWO) TIMES DAILY.  . [DISCONTINUED] Magnesium (V-R MAGNESIUM) 250 MG TABS Take by mouth. (Patient not taking: Reported on 06/09/2020)  . [DISCONTINUED] Potassium Gluconate 2.5 MEQ TABS Take by mouth.   No  facility-administered medications prior to visit.    Review of Systems  Constitutional: Negative for appetite change, chills, fatigue and fever.  Respiratory: Negative for chest tightness and shortness of breath.   Cardiovascular: Negative for chest pain and palpitations.  Gastrointestinal: Negative for abdominal pain, nausea and vomiting.  Neurological: Negative for dizziness and weakness.     Objective    BP (!) 152/70 (BP Location: Left Arm, Patient Position: Sitting, Cuff Size: Normal)   Pulse 79   Temp 98.2 F (36.8 C) (Oral)   Resp 16   Wt 118 lb (53.5 kg)   BMI 21.58 kg/m    Physical Exam  General appearance: Well developed, well nourished female, cooperative and in no acute distress Head: Normocephalic, without obvious abnormality, atraumatic Respiratory: Respirations even and unlabored, normal respiratory rate Extremities: All extremities are intact.  Skin: Skin color, texture, turgor normal. No rashes seen  Psych: Appropriate mood and affect. Neurologic: Mental status: Alert, oriented to person, place, and time, thought content appropriate.   No results found for any visits on 06/09/20.  Assessment & Plan     1. Hypothyroidism, unspecified type - TSH - T4, free  2. Hypokalemia Has only had enough tablets to take 2 daily for a couple of week, will refill with enough supply to take TID and she will have labs drawn in 1-2 weeks.  - Magnesium - Potassium - potassium chloride SA (KLOR-CON M20) 20 MEQ tablet; Take 1 tablet (20 mEq total) by mouth 3 (three) times daily.  Dispense: 270 tablet; Refill: 3  3. Vitamin D deficiency  - VITAMIN D 25 Hydroxy (Vit-D Deficiency, Fractures)  4. Gitelman syndrome   5. Allergy to influenza vaccine She self reports history of anaphylactic reaction to flu vaccine and various allergic reactions to several other medications. Written letter today that this history puts her increased risk of serious reaction to current available  covid vaccines. She should continue other covid precautions including masking and social distancing and encourage all of her social contacts to be vaccinated.       The entirety of the information documented in the History of Present Illness, Review of Systems and Physical Exam were personally obtained by me. Portions of this information were initially documented by the CMA and reviewed by me for thoroughness and accuracy.      Lelon Huh, MD  [facility] [phone] (phone) [phone] (fax)  [place]

## 2020-06-11 ENCOUNTER — Encounter: Payer: Self-pay | Admitting: Family Medicine

## 2020-06-21 ENCOUNTER — Other Ambulatory Visit: Payer: Self-pay | Admitting: Family Medicine

## 2020-06-21 NOTE — Telephone Encounter (Signed)
Per OV notes patient will have labs drawn this week. 30 day courtesy supply provided.

## 2020-06-22 DIAGNOSIS — E559 Vitamin D deficiency, unspecified: Secondary | ICD-10-CM | POA: Diagnosis not present

## 2020-06-22 DIAGNOSIS — E876 Hypokalemia: Secondary | ICD-10-CM | POA: Diagnosis not present

## 2020-06-22 DIAGNOSIS — E039 Hypothyroidism, unspecified: Secondary | ICD-10-CM | POA: Diagnosis not present

## 2020-06-23 LAB — MAGNESIUM: Magnesium: 1.9 mg/dL (ref 1.6–2.3)

## 2020-06-23 LAB — TSH: TSH: 3.19 u[IU]/mL (ref 0.450–4.500)

## 2020-06-23 LAB — VITAMIN D 25 HYDROXY (VIT D DEFICIENCY, FRACTURES): Vit D, 25-Hydroxy: 26.8 ng/mL — ABNORMAL LOW (ref 30.0–100.0)

## 2020-06-23 LAB — T4, FREE: Free T4: 1.22 ng/dL (ref 0.82–1.77)

## 2020-06-23 LAB — POTASSIUM: Potassium: 3.7 mmol/L (ref 3.5–5.2)

## 2020-07-23 ENCOUNTER — Other Ambulatory Visit: Payer: Self-pay | Admitting: Family Medicine

## 2020-08-01 ENCOUNTER — Other Ambulatory Visit: Payer: Self-pay | Admitting: Family Medicine

## 2020-08-01 DIAGNOSIS — K219 Gastro-esophageal reflux disease without esophagitis: Secondary | ICD-10-CM

## 2020-08-01 NOTE — Telephone Encounter (Signed)
Requested Prescriptions  Pending Prescriptions Disp Refills  . 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 tiZANidine (ZANAFLEX) 4 MG tablet [Pharmacy Med Name: TIZANIDINE HCL 4 MG TABLET] 180 tablet     Sig: TAKE 1 TABLET (4 MG TOTAL) BY MOUTH 2 (TWO) TIMES DAILY.     Not Delegated - Cardiovascular:  Alpha-2 Agonists - tizanidine Failed - 08/01/2020  4:54 PM      Failed - This refill cannot be delegated      Passed - Valid encounter within last 6 months    Recent Outpatient Visits          1 month ago Hypothyroidism, unspecified type   [facility] [name], MD   11 months ago COVID-19 virus infection   [facility] Rosanna Randy, Retia Passe., MD   1 year ago Gitelman syndrome   [facility] [name], MD   2 years ago Persistent cough for 3 weeks or longer   [place], Vickki Muff, PA-C   2 years ago Asthmatic bronchitis, mild intermittent, with acute exacerbation   [employer], Vickki Muff, PA-C             . baclofen (LIORESAL) 20 MG tablet [Pharmacy Med Name: BACLOFEN 20 MG  TABLET] 360 tablet     Sig: TAKE 1 TABLET (20 MG TOTAL) BY MOUTH 4 (FOUR) TIMES DAILY.     Not Delegated - Analgesics:  Muscle Relaxants Failed - 08/01/2020  4:54 PM      Failed - This refill cannot be delegated      Passed - Valid encounter within last 6 months    Recent Outpatient Visits          1 month ago Hypothyroidism, unspecified type   [facility] [name], MD   11 months ago COVID-19 virus infection   [facility] Rosanna Randy, Retia Passe., MD   1 year ago Gitelman syndrome   [facility] [name], MD   2 years ago Persistent cough for 3 weeks or longer   [place], PA-C   2 years ago Asthmatic bronchitis, mild intermittent, with acute exacerbation   [place], Vickki Muff, [place]

## 2020-08-01 NOTE — Telephone Encounter (Signed)
Requested medication (s) are due for refill today: Tizanidine: yes    baclofen: no  Requested medication (s) are on the active medication list: yes  Last refill:  Tizanidine: 07/27/2018     Baclofen: 03/25/20  Future visit scheduled: no  Notes to clinic:  meds not delegated to NT to RF   Requested Prescriptions  Pending Prescriptions Disp Refills   tiZANidine (ZANAFLEX) 4 MG tablet [Pharmacy Med Name: TIZANIDINE HCL 4 MG TABLET] 180 tablet     Sig: TAKE 1 TABLET (4 MG TOTAL) BY MOUTH 2 (TWO) TIMES DAILY.      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   [facility]  [name], MD   2 years ago Persistent cough for 3 weeks or longer   [facility], Vickki Muff, PA-C   2 years ago Asthmatic bronchitis, mild intermittent, with acute exacerbation   [facility], Vickki Muff, PA-C                 Refused Prescriptions Disp Refills   esomeprazole ([place]) 40 MG capsule [Pharmacy Med Name: ESOMEPRAZOLE MAG DR 40 MG CAP] 90 capsule     Sig: TAKE 1 CAPSULE BY MOUTH [place]      Gastroenterology: Proton Pump Inhibitors Passed - 08/01/2020  4:54 PM      Passed - Valid encounter within last 12 months    Recent Outpatient Visits           1 month ago Hypothyroidism, unspecified type   [facility] [name], MD   11 months ago COVID-19 virus infection   [facility] Rosanna Randy, Retia Passe., MD   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

## 2020-08-31 ENCOUNTER — Other Ambulatory Visit: Payer: Self-pay | Admitting: Family Medicine

## 2020-09-03 ENCOUNTER — Other Ambulatory Visit: Payer: Self-pay | Admitting: Family Medicine

## 2020-09-03 DIAGNOSIS — E876 Hypokalemia: Secondary | ICD-10-CM

## 2020-09-07 ENCOUNTER — Encounter: Payer: Self-pay | Admitting: Family Medicine

## 2020-09-08 ENCOUNTER — Telehealth (INDEPENDENT_AMBULATORY_CARE_PROVIDER_SITE_OTHER): Payer: BC Managed Care – PPO | Admitting: Family Medicine

## 2020-09-08 ENCOUNTER — Encounter: Payer: Self-pay | Admitting: Family Medicine

## 2020-09-08 ENCOUNTER — Other Ambulatory Visit: Payer: Self-pay

## 2020-09-08 VITALS — Temp 99.0°F

## 2020-09-08 DIAGNOSIS — U071 COVID-19: Secondary | ICD-10-CM

## 2020-09-08 DIAGNOSIS — J45909 Unspecified asthma, uncomplicated: Secondary | ICD-10-CM | POA: Diagnosis not present

## 2020-09-08 MED ORDER — PREDNISONE 10 MG PO TABS: ORAL_TABLET | ORAL | 0 refills | Status: AC

## 2020-09-08 MED ORDER — AZITHROMYCIN 250 MG PO TABS: ORAL_TABLET | ORAL | 0 refills | Status: AC

## 2020-09-08 NOTE — Progress Notes (Signed)
MyChart Video Visit    Virtual Visit via Video Note   This visit type was conducted due to national recommendations for restrictions regarding the COVID-19 Pandemic (e.g. social distancing) in an effort to limit this patient's exposure and mitigate transmission in our community. This patient is at least at moderate risk for complications without adequate follow up. This format is felt to be most appropriate for this patient at this time. Physical exam was limited by quality of the video and audio technology used for the visit.   Patient location: home Provider location: bfp   I discussed the limitations of evaluation and management by telemedicine and the availability of in person appointments. The patient expressed understanding and agreed to proceed.  Patient: Kayla Curry   DOB: April 29, 1971   50 y.o. Female  MRN: [id number] Visit Date: 09/08/2020  Today's healthcare provider: Lelon Huh, MD   Chief Complaint  Patient presents with  . Cough   Subjective    Cough This is a new problem. Episode onset: 1 week ago. The problem has been unchanged. The cough is productive of sputum (thick white sputum). Associated symptoms include chills, a fever, headaches, myalgias, rhinorrhea and a sore throat. Pertinent negatives include no chest pain, postnasal drip or shortness of breath. Treatments tried: OTC cold and cough medication and Tylenol. The treatment provided no relief.    Patient reports she tested positive for COVID on 09/04/2019.  Has not had the Covid Vaccine due to history of severe allergies. Not feeling short of breath. Does not have oximeter Had Covid last year and improved quickly with oral steroids.      Medications: Outpatient Medications Prior to Visit  Medication Sig  . baclofen (LIORESAL) 20 MG tablet TAKE 1 TABLET (20 MG TOTAL) BY MOUTH 4 (FOUR) TIMES DAILY.  . celecoxib (CELEBREX) 100 MG capsule Take 1 capsule (100 mg total) by mouth 2 (two) times daily.   . Cholecalciferol (VITAMIN D3) 25 MCG (1000 UT) CAPS Take 1 capsule (1,000 Units total) by mouth daily.  Marland Kitchen EPINEPHrine 0.3 mg/0.3 mL IJ SOAJ injection Inject into the muscle.  . esomeprazole (NEXIUM) 40 MG capsule TAKE 1 CAPSULE BY MOUTH EVERY DAY BEFORE BREAKFAST  . levothyroxine (SYNTHROID) 75 MCG tablet TAKE 1 TABLET (75 MCG TOTAL) BY MOUTH DAILY BEFORE BREAKFAST. TAKE ONE TABLET BY MOUTH DAILY  . magnesium oxide (MAG-OX) 400 (241.3 Mg) MG tablet Take 1 tablet (400 mg total) by mouth 2 (two) times daily. (Patient taking differently: Take 400 mg by mouth daily.)  . metaproterenol (ALUPENT) 20 MG tablet Take 1 tablet (20 mg total) by mouth 3 (three) times daily as needed.  . potassium chloride SA (KLOR-CON M20) 20 MEQ tablet Take 1 tablet (20 mEq total) by mouth 3 (three) times daily.  . promethazine (PHENERGAN) 25 MG tablet TAKE 1 TABLET BY MOUTH EVERY 6 HOURS AS NEEDED FOR NAUSEA  . tiZANidine (ZANAFLEX) 4 MG tablet TAKE 1 TABLET (4 MG TOTAL) BY MOUTH 2 (TWO) TIMES DAILY.   No facility-administered medications prior to visit.    Review of Systems  Constitutional: Positive for chills, diaphoresis, fatigue and fever. Negative for appetite change.  HENT: Positive for congestion (chest congestion), rhinorrhea, sneezing and sore throat. Negative for postnasal drip, sinus pressure and sinus pain.   Respiratory: Positive for cough. Negative for chest tightness and shortness of breath.   Cardiovascular: Negative for chest pain and palpitations.  Gastrointestinal: Negative for abdominal pain, nausea and vomiting.  Musculoskeletal: Positive for myalgias.  Neurological: Positive for headaches. Negative for dizziness and weakness.      Objective    Temp 99 F (37.2 C) (Oral)    Physical Exam   Awake, alert, oriented x 3. In no apparent distress   Assessment & Plan     1. COVID-19 Encouraged to monitor home oxygen levels and go to [facility] or ER if dyspnea or persistent hypoxemia.  -  azithromycin (ZITHROMAX) 250 MG tablet; 2 by mouth today, then 1 daily for 4 days  Dispense: 6 tablet; Refill: 0 - predniSONE (DELTASONE) 10 MG tablet; 6 tablets for 2 days, then 5 for 2 days, then 4 for 2 days, then 3 for 2 days, then 2 for 2 days, then 1 for 2 days.  Dispense: 42 tablet; Refill: 0  2. Extrinsic asthma without complication, unspecified asthma severity, unspecified whether persistent  - predniSONE (DELTASONE) 10 MG tablet; 6 tablets for 2 days, then 5 for 2 days, then 4 for 2 days, then 3 for 2 days, then 2 for 2 days, then 1 for 2 days.  Dispense: 42 tablet; Refill: 0   No follow-ups on file.     I discussed the assessment and treatment plan with the patient. The patient was provided an opportunity to ask questions and all were answered. The patient agreed with the plan and demonstrated an understanding of the instructions.   The patient was advised to call back or seek an in-person evaluation if the symptoms worsen or if the condition fails to improve as anticipated.  I provided 12 minutes of non-face-to-face time during this encounter.  The entirety of the information documented in the History of Present Illness, Review of Systems and Physical Exam were personally obtained by me. Portions of this information were initially documented by the CMA and reviewed by me for thoroughness and accuracy.     Lelon Huh, MD [facility] [phone] (phone) [phone] (fax)  [place]

## 2020-09-09 ENCOUNTER — Encounter: Payer: Self-pay | Admitting: Family Medicine

## 2020-09-09 DIAGNOSIS — J45909 Unspecified asthma, uncomplicated: Secondary | ICD-10-CM | POA: Insufficient documentation

## 2020-09-12 ENCOUNTER — Other Ambulatory Visit: Payer: Self-pay | Admitting: Family Medicine

## 2020-09-12 DIAGNOSIS — K219 Gastro-esophageal reflux disease without esophagitis: Secondary | ICD-10-CM

## 2020-11-06 ENCOUNTER — Other Ambulatory Visit: Payer: Self-pay | Admitting: Family Medicine

## 2020-11-06 NOTE — Telephone Encounter (Signed)
Requested medication (s) are due for refill today: no  Requested medication (s) are on the active medication list: yes  Last refill:  06/07/20  Future visit scheduled: no  Notes to clinic:  med not delegated to NT to RF   Requested Prescriptions  Pending Prescriptions Disp Refills   promethazine (PHENERGAN) 25 MG tablet [Pharmacy Med Name: PROMETHAZINE 25 MG TABLET] 60 tablet 5    Sig: TAKE 1 TABLET BY MOUTH EVERY 6 HOURS AS NEEDED FOR NAUSEA      Not Delegated - Gastroenterology: Antiemetics Failed - 11/06/2020  1:06 PM      Failed - This refill cannot be delegated      Passed - Valid encounter within last 6 months    Recent Outpatient Visits           1 month ago [place], Donald E, MD   5 months ago Hypothyroidism, unspecified type   [facility] [name], MD   1 year ago COVID-19 virus infection   [facility] Jerrol Banana., MD   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

## 2020-11-08 ENCOUNTER — Encounter: Payer: Self-pay | Admitting: Family Medicine

## 2021-03-08 ENCOUNTER — Other Ambulatory Visit: Payer: Self-pay | Admitting: Family Medicine

## 2021-03-22 ENCOUNTER — Encounter: Payer: Self-pay | Admitting: Family Medicine

## 2021-03-22 MED ORDER — PROMETHAZINE HCL 25 MG PO TABS: 25.0000 mg | ORAL_TABLET | Freq: Four times a day (QID) | ORAL | 5 refills | Status: DC | PRN

## 2021-06-14 ENCOUNTER — Other Ambulatory Visit: Payer: Self-pay

## 2021-06-14 ENCOUNTER — Ambulatory Visit: Payer: BC Managed Care – PPO | Admitting: Family Medicine

## 2021-06-14 ENCOUNTER — Encounter: Payer: Self-pay | Admitting: Family Medicine

## 2021-06-14 VITALS — BP 141/71 | HR 81 | Temp 97.8°F | Ht 62.0 in | Wt 124.0 lb

## 2021-06-14 DIAGNOSIS — E039 Hypothyroidism, unspecified: Secondary | ICD-10-CM | POA: Diagnosis not present

## 2021-06-14 DIAGNOSIS — N3000 Acute cystitis without hematuria: Secondary | ICD-10-CM | POA: Diagnosis not present

## 2021-06-14 DIAGNOSIS — E876 Hypokalemia: Secondary | ICD-10-CM | POA: Diagnosis not present

## 2021-06-14 DIAGNOSIS — E559 Vitamin D deficiency, unspecified: Secondary | ICD-10-CM | POA: Diagnosis not present

## 2021-06-14 LAB — POCT URINALYSIS DIPSTICK
Bilirubin, UA: NEGATIVE
Glucose, UA: NEGATIVE
Ketones, UA: NEGATIVE
Nitrite, UA: NEGATIVE
Protein, UA: POSITIVE — AB
Spec Grav, UA: <=1.005 — AB (ref 1.010–1.025)
Urobilinogen, UA: 0.2 E.U./dL
pH, UA: 6.5 (ref 5.0–8.0)

## 2021-06-14 MED ORDER — NITROFURANTOIN MONOHYD MACRO 100 MG PO CAPS: 100.0000 mg | ORAL_CAPSULE | Freq: Two times a day (BID) | ORAL | 0 refills | Status: AC

## 2021-06-14 NOTE — Progress Notes (Signed)
Established patient visit   Patient: Kayla Curry   DOB: 11-10-1970   50 y.o. Female  MRN: [id number] Visit Date: 06/14/2021  Today's healthcare provider: Lelon Huh, MD   Chief Complaint  Patient presents with   Hypothyroidism   Urinary Tract Infection   Subjective    Urinary Tract Infection  This is a new problem. The current episode started in the past 7 days. The problem has been gradually worsening. There has been no fever. Associated symptoms include frequency and urgency. Pertinent negatives include no flank pain, hematuria, hesitancy, nausea or vomiting.   Hypothyroid, follow-up  Lab Results  Component Value Date   TSH 3.190 06/22/2020   TSH 2.990 11/10/2019   TSH 4.060 06/19/2019   FREET4 1.22 06/22/2020   FREET4 1.0 06/15/2017   T4TOTAL 8.6 08/09/2018   Wt Readings from Last 3 Encounters:  06/14/21 124 lb (56.2 kg)  06/09/20 118 lb (53.5 kg)  08/21/19 126 lb (57.2 kg)    She was last seen for hypothyroid 11 months ago.  Management since that visit includes no changes. She reports excellent compliance with treatment. She is not having side effects.   Symptoms: No change in energy level No constipation  No diarrhea No heat / cold intolerance  No nervousness No palpitations  No weight changes    -----------------------------------------------------------------------------------------  Vitamin D deficiency, follow-up  Lab Results  Component Value Date   VD25OH 26.8 (L) 06/22/2020   VD25OH 33.3 11/10/2019   VD25OH 27.6 (L) 06/19/2019   CALCIUM 9.5 06/19/2019   CALCIUM 9.7 08/09/2018  ) Wt Readings from Last 3 Encounters:  06/14/21 124 lb (56.2 kg)  06/09/20 118 lb (53.5 kg)  08/21/19 126 lb (57.2 kg)    She was last seen for vitamin D deficiency 1  year  ago.  Management since that visit includes recommend  increasing Vitamin d supplement to 2000 units a day.  She reports excellent compliance with treatment. She is not having side  effects.   Symptoms: No change in energy level No numbness or tingling  No bone pain No unexplained fracture   ---------------------------------------------------------------------------------------------------     Medications: Outpatient Medications Prior to Visit  Medication Sig   baclofen (LIORESAL) 20 MG tablet TAKE 1 TABLET (20 MG TOTAL) BY MOUTH 4 (FOUR) TIMES DAILY.   celecoxib (CELEBREX) 100 MG capsule TAKE 1 CAPSULE BY MOUTH TWICE A DAY   Cholecalciferol (VITAMIN D3) 25 MCG (1000 UT) CAPS Take 1 capsule (1,000 Units total) by mouth daily.   EPINEPHrine 0.3 mg/0.3 mL IJ SOAJ injection Inject into the muscle.   esomeprazole (NEXIUM) 40 MG capsule TAKE 1 CAPSULE BY MOUTH EVERY DAY BEFORE BREAKFAST   levothyroxine (SYNTHROID) 75 MCG tablet TAKE 1 TABLET (75 MCG TOTAL) BY MOUTH DAILY BEFORE BREAKFAST. TAKE ONE TABLET BY MOUTH DAILY   magnesium oxide (MAG-OX) 400 (241.3 Mg) MG tablet Take 1 tablet (400 mg total) by mouth 2 (two) times daily. (Patient taking differently: Take 400 mg by mouth daily.)   potassium chloride SA (KLOR-CON M20) 20 MEQ tablet Take 1 tablet (20 mEq total) by mouth 3 (three) times daily.   promethazine (PHENERGAN) 25 MG tablet Take 1 tablet (25 mg total) by mouth every 6 (six) hours as needed. for nausea   tiZANidine (ZANAFLEX) 4 MG tablet TAKE 1 TABLET (4 MG TOTAL) BY MOUTH 2 (TWO) TIMES DAILY.   metaproterenol (ALUPENT) 20 MG tablet Take 1 tablet (20 mg total) by mouth 3 (three) times daily  as needed.   No facility-administered medications prior to visit.    Review of Systems  Constitutional: Negative.   Gastrointestinal: Negative.  Negative for nausea and vomiting.  Genitourinary:  Positive for dysuria, frequency and urgency. Negative for decreased urine volume, difficulty urinating, dyspareunia, flank pain, hematuria, hesitancy, vaginal bleeding, vaginal discharge and vaginal pain.      Objective    BP (!) 141/71 (BP Location: Right Arm, Patient  Position: Sitting, Cuff Size: Normal)   Pulse 81   Temp 97.8 F (36.6 C) (Oral)   Ht 5\' 2"  (1.575 m)   Wt 124 lb (56.2 kg)   SpO2 99%   BMI 22.68 kg/m    Physical Exam  General appearance: Well developed, well nourished female, cooperative and in no acute distress Head: Normocephalic, without obvious abnormality, atraumatic Respiratory: Respirations even and unlabored, normal respiratory rate Extremities: All extremities are intact.  Skin: Skin color, texture, turgor normal. No rashes seen  Psych: Appropriate mood and affect. Neurologic: Mental status: Alert, oriented to person, place, and time, thought content appropriate.   Results for orders placed or performed in visit on 06/14/21  POCT urinalysis dipstick  Result Value Ref Range   Color, UA     Clarity, UA     Glucose, UA Negative Negative   Bilirubin, UA Negative    Ketones, UA Negative    Spec Grav, UA <=1.005 (A) 1.010 - 1.025   Blood, UA Trace    pH, UA 6.5 5.0 - 8.0   Protein, UA Positive (A) Negative   Urobilinogen, UA 0.2 0.2 or 1.0 E.U./dL   Nitrite, UA Negative    Leukocytes, UA Moderate (2+) (A) Negative   Appearance     Odor      Assessment & Plan     1. Gitelman syndrome   2. Hypothyroidism, unspecified type  - TSH - T4, free  3. Hypomagnesemia  - Magnesium  4. Hypokalemia  - Renal function panel  5. Vitamin D deficiency  - VITAMIN D 25 Hydroxy (Vit-D Deficiency, Fractures)  6. Acute cystitis without hematuria  - Urine Culture - nitrofurantoin, macrocrystal-monohydrate, (MACROBID) 100 MG capsule; Take 1 capsule (100 mg total) by mouth 2 (two) times daily for 7 days.  Dispense: 14 capsule; Refill: 0        The entirety of the information documented in the History of Present Illness, Review of Systems and Physical Exam were personally obtained by me. Portions of this information were initially documented by the CMA and reviewed by me for thoroughness and accuracy.     Lelon Huh, MD  [facility] [phone] (phone) [phone] (fax)  [place]

## 2021-06-15 LAB — RENAL FUNCTION PANEL
Albumin: 4.6 g/dL (ref 3.8–4.8)
BUN/Creatinine Ratio: 14 (ref 9–23)
BUN: 12 mg/dL (ref 6–24)
CO2: 20 mmol/L (ref 20–29)
Calcium: 9.6 mg/dL (ref 8.7–10.2)
Chloride: 104 mmol/L (ref 96–106)
Creatinine, Ser: 0.86 mg/dL (ref 0.57–1.00)
Glucose: 84 mg/dL (ref 70–99)
Phosphorus: 3.7 mg/dL (ref 3.0–4.3)
Potassium: 3.4 mmol/L — ABNORMAL LOW (ref 3.5–5.2)
Sodium: 141 mmol/L (ref 134–144)
eGFR: 83 mL/min/{1.73_m2} (ref >59)

## 2021-06-15 LAB — VITAMIN D 25 HYDROXY (VIT D DEFICIENCY, FRACTURES): Vit D, 25-Hydroxy: 30.2 ng/mL (ref 30.0–100.0)

## 2021-06-15 LAB — T4, FREE: Free T4: 1.25 ng/dL (ref 0.82–1.77)

## 2021-06-15 LAB — MAGNESIUM: Magnesium: 2 mg/dL (ref 1.6–2.3)

## 2021-06-15 LAB — TSH: TSH: 3.08 u[IU]/mL (ref 0.450–4.500)

## 2021-06-20 LAB — URINE CULTURE

## 2021-06-20 LAB — SPECIMEN STATUS REPORT

## 2021-07-19 ENCOUNTER — Other Ambulatory Visit: Payer: Self-pay | Admitting: Family Medicine

## 2021-07-19 DIAGNOSIS — E876 Hypokalemia: Secondary | ICD-10-CM

## 2021-08-16 ENCOUNTER — Other Ambulatory Visit: Payer: Self-pay | Admitting: Family Medicine

## 2021-09-05 ENCOUNTER — Other Ambulatory Visit: Payer: Self-pay | Admitting: Family Medicine

## 2021-09-05 DIAGNOSIS — K219 Gastro-esophageal reflux disease without esophagitis: Secondary | ICD-10-CM

## 2021-10-05 ENCOUNTER — Encounter: Payer: Self-pay | Admitting: Family Medicine

## 2021-10-06 NOTE — Telephone Encounter (Signed)
Last refill: 08/03/2020 #180, with 3 refills  Last office visit: 06/14/2021 Next office visit: no future visit scheduled

## 2021-10-07 MED ORDER — TIZANIDINE HCL 4 MG PO TABS: ORAL_TABLET | ORAL | 3 refills | Status: AC

## 2021-12-25 ENCOUNTER — Other Ambulatory Visit: Payer: Self-pay | Admitting: Family Medicine

## 2022-04-01 ENCOUNTER — Other Ambulatory Visit: Payer: Self-pay | Admitting: Family Medicine

## 2022-04-04 DIAGNOSIS — E278 Other specified disorders of adrenal gland: Secondary | ICD-10-CM | POA: Diagnosis not present

## 2022-04-04 DIAGNOSIS — E039 Hypothyroidism, unspecified: Secondary | ICD-10-CM | POA: Diagnosis not present

## 2022-04-04 DIAGNOSIS — E876 Hypokalemia: Secondary | ICD-10-CM | POA: Diagnosis not present

## 2022-04-05 DIAGNOSIS — Z13220 Encounter for screening for lipoid disorders: Secondary | ICD-10-CM | POA: Diagnosis not present

## 2022-04-05 DIAGNOSIS — E876 Hypokalemia: Secondary | ICD-10-CM | POA: Diagnosis not present

## 2022-04-05 DIAGNOSIS — E039 Hypothyroidism, unspecified: Secondary | ICD-10-CM | POA: Diagnosis not present

## 2022-04-05 DIAGNOSIS — E559 Vitamin D deficiency, unspecified: Secondary | ICD-10-CM | POA: Diagnosis not present

## 2022-04-05 DIAGNOSIS — Z1389 Encounter for screening for other disorder: Secondary | ICD-10-CM | POA: Diagnosis not present

## 2022-04-21 ENCOUNTER — Other Ambulatory Visit: Payer: Self-pay | Admitting: Family Medicine

## 2022-04-25 ENCOUNTER — Other Ambulatory Visit: Payer: Self-pay | Admitting: Family Medicine

## 2022-04-25 ENCOUNTER — Ambulatory Visit
Admission: RE | Admit: 2022-04-25 | Discharge: 2022-04-25 | Disposition: A | Discharge status: Home / Self Care | Payer: BC Managed Care – PPO | Source: Ambulatory Visit | Attending: Family Medicine | Admitting: Family Medicine

## 2022-04-25 DIAGNOSIS — M79604 Pain in right leg: Secondary | ICD-10-CM | POA: Insufficient documentation

## 2022-04-25 DIAGNOSIS — M79651 Pain in right thigh: Secondary | ICD-10-CM | POA: Diagnosis not present

## 2022-04-25 DIAGNOSIS — R600 Localized edema: Secondary | ICD-10-CM

## 2022-06-13 ENCOUNTER — Encounter: Payer: Self-pay | Admitting: Gastroenterology

## 2022-06-30 ENCOUNTER — Ambulatory Visit: Payer: BC Managed Care – PPO | Admitting: Physician Assistant

## 2022-06-30 ENCOUNTER — Other Ambulatory Visit: Payer: Self-pay | Admitting: Physician Assistant

## 2022-06-30 ENCOUNTER — Telehealth: Payer: Self-pay | Admitting: *Deleted

## 2022-06-30 ENCOUNTER — Other Ambulatory Visit: Payer: Self-pay | Admitting: Family Medicine

## 2022-06-30 DIAGNOSIS — T859XXD Unspecified complication of internal prosthetic device, implant and graft, subsequent encounter: Secondary | ICD-10-CM

## 2022-06-30 NOTE — Telephone Encounter (Signed)
Pt calling stating that her interstim is not working. Pt states she is having back pain and can fill "a bunch" wires loose in her back. Pt was last seen in 2018, pt was added to sam's schedule but due to patient not being seen in 5 years we have scheduled her for a KUB and to see Dr. Matilde Sprang on Monday. Pt states she was told to come every 5 years, I advised pt that she needs to be seen at least once a year. KUB ordered and appt scheduled.

## 2022-06-30 NOTE — Telephone Encounter (Signed)
Requested medication (s) are due for refill today: yes  Requested medication (s) are on the active medication list:yes  Last refill:  04/01/22  Future visit scheduled: no  Notes to clinic:  Unable to refill per protocol, PCP listed is not at this practice, routing for review.     Requested Prescriptions  Pending Prescriptions Disp Refills   celecoxib (CELEBREX) 100 MG capsule [Pharmacy Med Name: CELECOXIB 100 MG CAPSULE] 180 capsule 0    Sig: TAKE 1 CAPSULE BY MOUTH TWICE A DAY     Analgesics:  COX2 Inhibitors Failed - 06/30/2022  2:25 AM      Failed - Manual Review: Labs are only required if the patient has taken medication for more than 8 weeks.      Failed - HGB in normal range and within 360 days    Hemoglobin  Date Value Ref Range Status  08/09/2018 14.0 11.1 - 15.9 g/dL Final         Failed - Cr in normal range and within 360 days    Creat  Date Value Ref Range Status  06/15/2017 0.86 0.50 - 1.10 mg/dL Final   Creatinine, Ser  Date Value Ref Range Status  06/14/2021 0.86 0.57 - 1.00 mg/dL Final         Failed - HCT in normal range and within 360 days    Hematocrit  Date Value Ref Range Status  08/09/2018 42.4 34.0 - 46.6 % Final         Failed - AST in normal range and within 360 days    AST  Date Value Ref Range Status  08/09/2018 21 0 - 40 IU/L Final   SGOT(AST)  Date Value Ref Range Status  01/31/2013 22 15 - 37 Unit/L Final         Failed - ALT in normal range and within 360 days    ALT  Date Value Ref Range Status  08/09/2018 26 0 - 32 IU/L Final   SGPT (ALT)  Date Value Ref Range Status  01/31/2013 29 12 - 78 U/L Final         Failed - eGFR is 30 or above and within 360 days    GFR, Est African American  Date Value Ref Range Status  06/15/2017 95 > OR = 60 mL/min/1.37m Final   GFR calc Af Amer  Date Value Ref Range Status  06/19/2019 78 >59 mL/min/1.73 Final   GFR, Est Non African American  Date Value Ref Range Status  06/15/2017  82 > OR = 60 mL/min/1.733mFinal   GFR calc non Af Amer  Date Value Ref Range Status  06/19/2019 68 >59 mL/min/1.73 Final   GFR  Date Value Ref Range Status  05/28/2012 107.22 >60.00 mL/min Final   eGFR  Date Value Ref Range Status  06/14/2021 83 >59 mL/min/1.73 Final         Failed - Valid encounter within last 12 months    Recent Outpatient Visits           1 year ago Gi[facility]unayndrome   [facility]   [place] year [place]   2 years ago Hypothyroidism, unspecified type   [facility]   2 years ago COCOVID-60irus infection   [facility]   2 years ago Gitelman syndrome   [facility]  Future Appointments             Today Debroah Loop, PA-C [facility] - Patient is not pregnant

## 2022-07-03 ENCOUNTER — Ambulatory Visit
Admission: RE | Admit: 2022-07-03 | Discharge: 2022-07-03 | Disposition: A | Discharge status: Home / Self Care | Payer: BC Managed Care – PPO | Source: Ambulatory Visit | Attending: Physician Assistant | Admitting: Physician Assistant

## 2022-07-03 ENCOUNTER — Ambulatory Visit (INDEPENDENT_AMBULATORY_CARE_PROVIDER_SITE_OTHER): Payer: BC Managed Care – PPO | Admitting: Urology

## 2022-07-03 ENCOUNTER — Ambulatory Visit
Admission: RE | Admit: 2022-07-03 | Discharge: 2022-07-03 | Disposition: A | Discharge status: Home / Self Care | Payer: BC Managed Care – PPO | Attending: Physician Assistant | Admitting: Physician Assistant

## 2022-07-03 ENCOUNTER — Encounter: Payer: Self-pay | Admitting: Urology

## 2022-07-03 VITALS — BP 160/88 | HR 94 | Ht 62.0 in | Wt 112.0 lb

## 2022-07-03 DIAGNOSIS — I878 Other specified disorders of veins: Secondary | ICD-10-CM | POA: Diagnosis not present

## 2022-07-03 DIAGNOSIS — T859XXD Unspecified complication of internal prosthetic device, implant and graft, subsequent encounter: Secondary | ICD-10-CM | POA: Diagnosis not present

## 2022-07-03 DIAGNOSIS — Z30431 Encounter for routine checking of intrauterine contraceptive device: Secondary | ICD-10-CM

## 2022-07-03 DIAGNOSIS — R339 Retention of urine, unspecified: Secondary | ICD-10-CM | POA: Insufficient documentation

## 2022-07-03 DIAGNOSIS — K6389 Other specified diseases of intestine: Secondary | ICD-10-CM | POA: Diagnosis not present

## 2022-07-03 DIAGNOSIS — R188 Other ascites: Secondary | ICD-10-CM | POA: Diagnosis not present

## 2022-07-03 DIAGNOSIS — Z462 Encounter for fitting and adjustment of other devices related to nervous system and special senses: Secondary | ICD-10-CM | POA: Diagnosis not present

## 2022-07-03 DIAGNOSIS — K3189 Other diseases of stomach and duodenum: Secondary | ICD-10-CM | POA: Diagnosis not present

## 2022-07-03 DIAGNOSIS — Z9049 Acquired absence of other specified parts of digestive tract: Secondary | ICD-10-CM | POA: Diagnosis not present

## 2022-07-03 NOTE — Progress Notes (Unsigned)
07/03/2022 3:08 PM   Kayla Curry 11/03/1970 [id number]  Referring provider: Maryland Pink, MD [address] [facility] [place],  [place] [address]  No chief complaint on file.   HPI: The patient had InterStim recently for retention. She was empirically treated for a possible wound infection. There has been a lot of troubleshooting over the phone and in the office for recurrent retention. The device was turned off as part of the process. Impedance checks were normal. When the device was turned up she started to feel a sensation in her foot again. The device has been replaced twice in the face of not be a little trouble shoot the exact dysfunction in the replacement has been successful. She has had pain on the left side before.   On April 16 she had a 2.5 cm x 1 cm raised area involving the right midline incision that was a bit tender. Some of the sutures were still present. The IPG was normal. My concerns were noted and I started her on Keflex     Today Subsequently the incision issues above normalize. Further programming was performed then. Fortunately the patient has not needed to catheterize. She finds that she turns it off at night and then again on in the morning it works better. She says her incision has completely cleared   Incision looks great    I explained once again to Kayla Curry that I think there is a very narrow margin for the device to work for her complicated voiding dysfunction. The 2 times the device was replaced and we really could not troubleshoot a true problem and likely there is a very narrow margin for benefit. Millimeters could make a difference. We will troubleshoot device again. I will see her.   Today I reviewed my operative note from November 13, 2016. The following is a note from February 2018. The patient has a complicated InterStim history. I did a revision in 2013 and then again in 2014. She has incomplete bladder emptying and urinary retention that  responds favorably to InterStim area she has had urinary tract infections needing Rocephin in the past. I spent a lot of time reviewing my medical records and her last surgery was in July 2014. I believe the right side it lead was replaced in the exact same position and I used the same IPG. At one point in time she had 2 leads and the one on the left was very sensitive with leg pain.   The patient in the last 2 weeks had to go back on self-catheterization. Prior to this she was voiding 4-5 times a day with a good flow and had no nocturia. She recently was treated twice for urinary tract infection with no improvement. She has not been getting urinary tract infections and they may have been over diagnosed   She reiterated that all the hardware is now on the right and her left-sided pain since removal of the left lead no longer is present. She has tried different settings and different amplitudes with no benefit. She used to feel vaginal sensation that now she does not and this corresponds with her retention. She feels a little bit of a pull in the right foot which she has for a long while  he patient has idiopathic urinary retention that responds favorably to InterStim. Unfortunately the durability of the treatment has not been ideal. She has had pain on the left side before.   A lot of troubleshooting was done by  [name] as well as the [employer] representative over the phone. They rerouted one of the pathways but she could still feel in her foot. They could not get good vaginal sensation but it appears that the impedance checks were normal. She's going to try the new setting. We will then turned off and on to see if that helps. We will get an AP and lateral view of the sacrum. We will follow her by telephone and reassess her in a few weeks.  Note from October 23, 2016 The patient has incomplete bladder emptying. Similar to last time we decided to replace the entire implant but this time the IPG as well. We  talked about retained tip and the chances of needing to use the left side if needed intraoperatively. We both recognize she has had pain on the left side before. She understands increased risk of infection and other complications. Watchful waiting and clean intermittent catheterization as an option was discussed. 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   I discussed the findings with her. She also understands that was scarring the lead may be dictated by previous insertions  I reviewed notes from [facility] In February 2018 I noted a similar malfunction in 2014 where we could not find obvious problems with the device and troubleshooting.  We use motor and sensory responses in the operating room to replace it and it worked well.  She is to get a lot of pain in the left side.  When I reviewed x-rays before the lead may have been a few millimeters deeper than it was prior.  She lost weight and it could have been artifact  Today the patient says in the last week she has had low back pain near the midline.  She says since then her flow is much slower and she is straining to urinate.  She says she can take many minutes to urinate.  She does not think she could urinate if she did not strain.  She is not sure she feels empty.  She is only voiding once or twice a day.  When the device is working well her flow was good and she voids about 3 times a day.  On physical examination she is very petite.  I could feel the lead to the right of the midline.  It was difficult to explain but I wondered if the lead was even frayed.  IPG is exceptionally close.  Incisions look good.  No cellulitis.  No infection.  No left-sided pain  The patient had an AP view of the pelvis and the lead looked intact and I could see it curl up underneath the IPG or next to the IPG.  It was  not a lateral x-ray taken.  Patient has not self catheterize yet but she thinks she probably needs to.  When she turned it up she feels it in the vagina and knows how to change programs.   PMH: Past Medical History:  Diagnosis Date   Adrenal adenoma, right    Asthma    Chronic pain syndrome    COVID-09 August 2019 and January 2022   History of pelvic fracture    History of supraventricular tachycardia    PER PT ON 02-28-2013 NO ISSUES SINCE ABLATION IN 2005   Injury of pelvis or lower limb  peripheral nerve, late effect    HX PELVIC FX-- RESIDUAL URINARY RETENTION   Magnesium metabolism disorder NEPHROLOGIST-  DR [name] ([facility] [place])   [facility] SYNDROME   Malfunction of device    INTERSTIM   Neurogenic bladder    PONV (postoperative nausea and vomiting)    reports PONV and rash with propofol   S/P ablation of ventricular arrhythmia HX SVT--  2005   [place]--  LAST VISIT 2012 -- PT RELEASED FROM CARE ON PRN BASIS   Self-catheterizes urinary bladder    Urinary retention    RESIDUAL FROM PELVIC FX INJURY---Self cath TID and prn   Wears contact lenses     Surgical History: Past Surgical History:  Procedure Laterality Date   APPENDECTOMY  1994   EXPL. LAP.   CARDIAC ELECTROPHYSIOLOGY STUDY AND ABLATION  2005- AT [facility]   SVT--  PT STATES NO ISSUES SINCE    ESOPHAGOGASTRODUODENOSCOPY (EGD) WITH PROPOFOL N/A 11/02/2017   Procedure: ESOPHAGOGASTRODUODENOSCOPY (EGD) WITH PROPOFOL;  Surgeon: Lucilla Lame, MD;  Location: [facility];  Service: Endoscopy;  Laterality: N/A;   [facility]ST[facility] IM[facility]ANT PLACEMENT  [facility]   INTERSTIM IMPLANT PLACEMENT  10/05/2011   Procedure: Barrie Lyme IMPLANT FIRST STAGE;  Surgeon: [name], MD;  Location: [facility];  Service: Urology;  Laterality: N/A;   INTERSTIM IMPLANT PLACEMENT  10/05/2011   Procedure: Barrie Lyme IMPLANT SECOND STAGE;  Surgeon: [name], MD;   Location[facility];  Service: Urology;  Laterality: N/A;   INTERSTIM IMPLANT PLACEMENT N/A 11/13/2016   Procedure: Barrie Lyme IMPLANT FIRST STAGE;  Surgeon: [name], MD;  Location: [facility];  Service: Urology;  Laterality: N/A;   INTERSTIM IMPLANT PLACEMENT N/A 11/13/2016   Procedure: Barrie Lyme IMPLANT SECOND STAGE;  Surgeon: [name], MD;  Location: [facility];  Service: Urology;  Laterality: N/A;   INTERSTIM IMPLANT REMOVAL N/A 11/13/2016   Procedure: REMOVAL OF INTERSTIM IMPLANT ONE AND TWO;  Surgeon: [name], MD;  Location: [facility];  Service: Urology;  Laterality: N/A;   INTERSTIM IMPLANT REVISION N/A 03/06/2013   Procedure: REVISION OF Barrie Lyme;  Surgeon: [name], MD;  Location: [facility];  Service: Urology;  Laterality: N/A;   LAPAROSCOPIC CHOLECYSTECTOMY  2001   TONSILLECTOMY  1998   TOTAL VAGINAL HYSTERECTOMY  2003   by patient report due to uterine prolapse. Advised she does not need CCa Screening    Home Medications:  Allergies as of 07/03/2022       Reactions   Advair Diskus [fluticasone-salmeterol] Shortness Of Breath   Albuterol Sulfate Shortness Of Breath   Atrovent Shortness Of Breath   Bethanechol Shortness Of Breath, Rash, Other (See Comments)   RESPIRATORY DISTRESS   Budesonide-formoterol Fumarate Shortness Of Breath   Influenza Vac Split Quad Anaphylaxis   Influenza Vaccines Anaphylaxis, Shortness Of Breath   Ipratropium Shortness Of Breath   Ondansetron Rash   Shellfish Allergy Anaphylaxis, Shortness Of Breath   Xopenex [levalbuterol] Shortness Of Breath   Compazine Other (See Comments)   DYSTONIC   Fluoride Preparations    Lorazepam Other (See Comments)   "HAS OPPOSITE EFFECT"   Levofloxacin Rash   Other Rash   DYSTONIC   Potassium-containing Compounds Nausea And Vomiting   Just the po potassium  No problem with iv   Propofol Nausea And  Vomiting, Rash   Zofran [ondansetron Hcl] Rash        Medication  List        Accurate as of July 03, 2022  3:08 PM. If you have any questions, ask your nurse or doctor.          baclofen 20 MG tablet Commonly known as: LIORESAL TAKE 1 TABLET BY MOUTH 4 TIMES DAILY.   celecoxib 100 MG capsule Commonly known as: CELEBREX TAKE 1 CAPSULE BY MOUTH TWICE A DAY   EPINEPHrine 0.3 mg/0.3 mL Soaj injection Commonly known as: EPI-PEN Inject into the muscle.   esomeprazole 40 MG capsule Commonly known as: NEXIUM TAKE 1 CAPSULE BY MOUTH EVERY DAY BEFORE BREAKFAST   Klor-Con M20 20 MEQ tablet Generic drug: potassium chloride SA TAKE 1 TABLET BY MOUTH 3 TIMES DAILY.   levothyroxine 75 MCG tablet Commonly known as: SYNTHROID Take 1 tablet (75 mcg total) by mouth daily before breakfast.   magnesium oxide 400 (241.3 Mg) MG tablet Commonly known as: MAG-OX Take 1 tablet (400 mg total) by mouth 2 (two) times daily. What changed: when to take this   metaproterenol 20 MG tablet Commonly known as: ALUPENT Take 1 tablet (20 mg total) by mouth 3 (three) times daily as needed.   promethazine 25 MG tablet Commonly known as: PHENERGAN TAKE 1 TABLET (25 MG TOTAL) BY MOUTH EVERY 6 (SIX) HOURS AS NEEDED. FOR NAUSEA   tiZANidine 4 MG tablet Commonly known as: ZANAFLEX TAKE 1 TABLET (4 MG TOTAL) BY MOUTH 2 (TWO) TIMES DAILY.   Vitamin D3 25 MCG (1000 UT) Caps Take 1 capsule (1,000 Units total) by mouth daily.        Allergies:  Allergies  Allergen Reactions   Advair Diskus [Fluticasone-Salmeterol] Shortness Of Breath   Albuterol Sulfate Shortness Of Breath   Atrovent Shortness Of Breath   Bethanechol Shortness Of Breath, Rash and Other (See Comments)    RESPIRATORY DISTRESS   Budesonide-Formoterol Fumarate Shortness Of Breath   Influenza Vac Split Quad Anaphylaxis   Influenza Vaccines Anaphylaxis and Shortness Of Breath   Ipratropium Shortness Of Breath   Ondansetron  Rash   Shellfish Allergy Anaphylaxis and Shortness Of Breath   Xopenex [Levalbuterol] Shortness Of Breath   Compazine Other (See Comments)    DYSTONIC   Fluoride Preparations    Lorazepam Other (See Comments)    "HAS OPPOSITE EFFECT"   Levofloxacin Rash   Other Rash    DYSTONIC   Potassium-Containing Compounds Nausea And Vomiting    Just the po potassium  No problem with iv   Propofol Nausea And Vomiting and Rash   Zofran [Ondansetron Hcl] Rash    Family History: Family History  Problem Relation Age of Onset   Hypertension Father    Colon cancer Neg Hx     Social History:  reports that she has never smoked. She has never used smokeless tobacco. She reports that she does not drink alcohol and does not use drugs.  ROS:                                        Physical Exam: There were no vitals taken for this visit.  Constitutional:  Alert and oriented, No acute distress.   Laboratory Data: Lab Results  Component Value Date   WBC 9.7 08/09/2018   HGB 14.0 08/09/2018   HCT 42.4 08/09/2018   MCV 90 08/09/2018   PLT 338 08/09/2018    Lab Results  Component Value Date  CREATININE 0.86 06/14/2021    No results found for: "PSA"  No results found for: "TESTOSTERONE"  No results found for: "HGBA1C"  Urinalysis    Component Value Date/Time   APPEARANCEUR Clear 10/10/2016 1428   GLUCOSEU Negative 10/10/2016 1428   BILIRUBINUR Negative 06/14/2021 1534   BILIRUBINUR Negative 10/10/2016 1428   PROTEINUR Positive (A) 06/14/2021 1534   PROTEINUR Negative 10/10/2016 1428   UROBILINOGEN 0.2 06/14/2021 1534   NITRITE Negative 06/14/2021 1534   NITRITE Negative 10/10/2016 1428   LEUKOCYTESUR Moderate (2+) (A) 06/14/2021 1534   LEUKOCYTESUR Negative 10/10/2016 1428    Pertinent Imaging:   Assessment & Plan: It is difficult to say why the patient is having discomfort.  I do not see any local tissue effects.  In regards to function she always  has such a narrow margin and not working or not working.  She is also very petite and is no longer 1 can feel the lead.  I am afraid if it is replaced she may not get the same benefit or she still could have similar pain.  I will have the [employer] rep troubleshoot based upon its complexity.   Patient understands my concern of replacing it and it not working or she still has pain.  She understands increased risk of infection.  She understands that she still petite is not a lot of room for the device.  We will schedule complete replacement like we have had before but we will have the [employer] rep troubleshoot her prior.  We will do this at [facility] long hospital to make certain we have a consistent team.  I will get a lateral x-ray of the pelvis today  Patient understands the concept of ongoing neuromodulation and scar tissue and how effects may not be reproducible    There are no diagnoses linked to this encounter.  No follow-ups on file.  [name], MD  [facility] [address], [place], [place] [address] [phone]

## 2022-07-04 ENCOUNTER — Other Ambulatory Visit: Payer: Self-pay | Admitting: Urology

## 2022-07-05 ENCOUNTER — Encounter: Payer: Self-pay | Admitting: Urology

## 2022-07-05 NOTE — Progress Notes (Signed)
Sent message, via epic in basket, requesting orders in epic from surgeon.  

## 2022-07-06 ENCOUNTER — Ambulatory Visit (INDEPENDENT_AMBULATORY_CARE_PROVIDER_SITE_OTHER): Payer: BC Managed Care – PPO | Admitting: Physician Assistant

## 2022-07-06 VITALS — BP 147/83 | HR 90 | Ht 62.0 in | Wt 112.0 lb

## 2022-07-06 DIAGNOSIS — T859XXA Unspecified complication of internal prosthetic device, implant and graft, initial encounter: Secondary | ICD-10-CM

## 2022-07-06 DIAGNOSIS — T859XXD Unspecified complication of internal prosthetic device, implant and graft, subsequent encounter: Secondary | ICD-10-CM

## 2022-07-06 NOTE — Progress Notes (Signed)
This interesting patient returned to clinic today for troubleshooting with [employer] rep [place].  An impedance check was performed, which was negative.  Battery check results as below. Capacity: 0 to 12% Longevity: 0 to 4 months  No gross abnormalities on her AP and lateral pelvic x-rays.  We were able to palpate her lead, but there is no evidence of overlying tissue infection.  She is already scheduled for InterStim replacement with Dr. Matilde Sprang.  We will keep this plan.  She is in agreement.  I spent 20 minutes on the day of the encounter to include pre-visit record review, face-to-face time with the patient, and post-visit ordering of tests.

## 2022-07-10 ENCOUNTER — Other Ambulatory Visit: Payer: Self-pay | Admitting: Urology

## 2022-07-11 NOTE — Patient Instructions (Signed)
SURGICAL WAITING ROOM VISITATION Patients having surgery or a procedure may have no more than 2 support people in the waiting area - these visitors may rotate.   Children under the age of 16 must have an adult with them who is not the patient. If the patient needs to stay at the hospital during part of their recovery, the visitor guidelines for inpatient rooms apply. Pre-op nurse will coordinate an appropriate time for 1 support person to accompany patient in pre-op.  This support person may not rotate.    Please refer to the [facility] website for the visitor guidelines for Inpatients (after your surgery is over and you are in a regular room).     Your procedure is scheduled on: 07/18/22   Report to [facility] Main Entrance    Report to admitting at 5:15 AM   Call this number if you have problems the morning of surgery [phone]   Do not eat food or drink liquids :After Midnight.          If you have questions, please contact your surgeon's office.   FOLLOW BOWEL PREP AND ANY ADDITIONAL PRE OP INSTRUCTIONS YOU RECEIVED FROM YOUR SURGEON'S OFFICE!!!     Oral Hygiene is also important to reduce your risk of infection.                                    Remember - BRUSH YOUR TEETH THE MORNING OF SURGERY WITH YOUR REGULAR TOOTHPASTE   Do NOT smoke after Midnight   Take these medicines the morning of surgery with A SIP OF WATER: Tylenol, Nexium, Levothyroxine                              You may not have any metal on your body including hair pins, jewelry, and body piercing             Do not wear make-up, lotions, powders, perfumes, or deodorant  Do not wear nail polish including gel and S&S, artificial/acrylic nails, or any other type of covering on natural nails including finger and toenails. If you have artificial nails, gel coating, etc. that needs to be removed by a nail salon please have this removed prior to surgery or surgery may need to be canceled/ delayed if  the surgeon/ anesthesia feels like they are unable to be safely monitored.   Do not shave  48 hours prior to surgery.    Do not bring valuables to the hospital. [place].   Contacts, dentures or bridgework may not be worn into surgery.  DO NOT [place]. PHARMACY WILL DISPENSE MEDICATIONS LISTED ON YOUR MEDICATION LIST TO YOU DURING YOUR ADMISSION [place]!    Patients discharged on the day of surgery will not be allowed to drive home.  Someone NEEDS to stay with you for the first 24 hours after anesthesia.              Please read over the following fact sheets you were given: IF [place] [phone] Schneiders    If you received a COVID test during your pre-op visit  it is requested that you wear a mask when out  in public, stay away from anyone that may not be feeling well and notify your surgeon if you develop symptoms. If you test positive for Covid or have been in contact with anyone that has tested positive in the last 10 days please notify you surgeon.    [place] [place] - Preparing for Surgery Before surgery, you can play an important role.  Because skin is not sterile, your skin needs to be as free of germs as possible.  You can reduce the number of germs on your skin by washing with CHG (chlorahexidine gluconate) soap before surgery.  CHG is an antiseptic cleaner which kills germs and bonds with the skin to continue killing germs even after washing. Please DO NOT use if you have an allergy to CHG or antibacterial soaps.  If your skin becomes reddened/irritated stop using the CHG and inform your nurse when you arrive at Short Stay. Do not shave (including legs and underarms) for at least 48 hours prior to the first CHG shower.  You may shave your face/neck.  Please follow these instructions carefully:  1.  Shower with CHG Soap the night before  surgery and the  morning of surgery.  2.  If you choose to wash your hair, wash your hair first as usual with your normal  shampoo.  3.  After you shampoo, rinse your hair and body thoroughly to remove the shampoo.                             4.  Use CHG as you would any other liquid soap.  You can apply chg directly to the skin and wash.  Gently with a scrungie or clean washcloth.  5.  Apply the CHG Soap to your body ONLY FROM THE NECK DOWN.   Do   not use on face/ open                           Wound or open sores. Avoid contact with eyes, ears mouth and   genitals (private parts).                       Wash face,  Genitals (private parts) with your normal soap.             6.  Wash thoroughly, paying special attention to the area where your    surgery  will be performed.  7.  Thoroughly rinse your body with warm water from the neck down.  8.  DO NOT shower/wash with your normal soap after using and rinsing off the CHG Soap.                9.  Pat yourself dry with a clean towel.            10.  Wear clean pajamas.            11.  Place clean sheets on your bed the night of your first shower and do not  sleep with pets. Day of Surgery : Do not apply any lotions/deodorants the morning of surgery.  Please wear clean clothes to the hospital/surgery center.  FAILURE TO FOLLOW THESE INSTRUCTIONS MAY RESULT IN THE CANCELLATION OF YOUR SURGERY  PATIENT SIGNATURE_________________________________  NURSE SIGNATURE__________________________________  ________________________________________________________________________

## 2022-07-11 NOTE — H&P (Signed)
The patient had InterStim recently for retention. She was empirically treated for a possible wound infection. There has been a lot of troubleshooting over the phone and in the office for recurrent retention. The device was turned off as part of the process. Impedance checks were normal. When the device was turned up she started to feel a sensation in her foot again. The device has been replaced twice in the face of not be a little trouble shoot the exact dysfunction in the replacement has been successful. She has had pain on the left side before.   On April 16 she had a 2.5 cm x 1 cm raised area involving the right midline incision that was a bit tender. Some of the sutures were still present. The IPG was normal. My concerns were noted and I started her on Keflex     Today Subsequently the incision issues above normalize. Further programming was performed then. Fortunately the patient has not needed to catheterize. She finds that she turns it off at night and then again on in the morning it works better. She says her incision has completely cleared   Incision looks great     I explained once again to Brittanee that I think there is a very narrow margin for the device to work for her complicated voiding dysfunction. The 2 times the device was replaced and we really could not troubleshoot a true problem and likely there is a very narrow margin for benefit. Millimeters could make a difference. We will troubleshoot device again. I will see her.    Today I reviewed my operative note from November 13, 2016. The following is a note from February 2018. The patient has a complicated InterStim history. I did a revision in 2013 and then again in 2014. She has incomplete bladder emptying and urinary retention that responds favorably to InterStim area she has had urinary tract infections needing Rocephin in the past. I spent a lot of time reviewing my medical records and her last surgery was in July 2014. I believe the  right side it lead was replaced in the exact same position and I used the same IPG. At one point in time she had 2 leads and the one on the left was very sensitive with leg pain.   The patient in the last 2 weeks had to go back on self-catheterization. Prior to this she was voiding 4-5 times a day with a good flow and had no nocturia. She recently was treated twice for urinary tract infection with no improvement. She has not been getting urinary tract infections and they may have been over diagnosed   She reiterated that all the hardware is now on the right and her left-sided pain since removal of the left lead no longer is present. She has tried different settings and different amplitudes with no benefit. She used to feel vaginal sensation that now she does not and this corresponds with her retention. She feels a little bit of a pull in the right foot which she has for a long while   he patient has idiopathic urinary retention that responds favorably to InterStim. Unfortunately the durability of the treatment has not been ideal. She has had pain on the left side before.   A lot of troubleshooting was done by Judson Roch as well as the [employer] representative over the phone. They rerouted one of the pathways but she could still feel in her foot. They could not get good vaginal sensation but it appears  that the impedance checks were normal. She's going to try the new setting. We will then turned off and on to see if that helps. We will get an AP and lateral view of the sacrum. We will follow her by telephone and reassess her in a few weeks.   Note from October 23, 2016 The patient has incomplete bladder emptying. Similar to last time we decided to replace the entire implant but this time the IPG as well. We talked about retained tip and the chances of needing to use the left side if needed intraoperatively. We both recognize she has had pain on the left side before. She understands increased risk of infection and  other complications. Watchful waiting and clean intermittent catheterization as an option was discussed. 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   I discussed the findings with her. She also understands that was scarring the lead may be dictated by previous insertions   I reviewed notes from [facility] In February 2018 I noted a similar malfunction in 2014 where we could not find obvious problems with the device and troubleshooting.  We use motor and sensory responses in the operating room to replace it and it worked well.  She is to get a lot of pain in the left side.  When I reviewed x-rays before the lead may have been a few millimeters deeper than it was prior.  She lost weight and it could have been artifact   Today the patient says in the last week she has had low back pain near the midline.  She says since then her flow is much slower and she is straining to urinate.  She says she can take many minutes to urinate.  She does not think she could urinate if she did not strain.  She is not sure she feels empty.  She is only voiding once or twice a day.  When the device is working well her flow was good and she voids about 3 times a day.   On physical examination she is very petite.  I could feel the lead to the right of the midline.  It was difficult to explain but I wondered if the lead was even frayed.  IPG is exceptionally close.  Incisions look good.  No cellulitis.  No infection.  No left-sided pain   The patient had an AP view of the pelvis and the lead looked intact and I could see it curl up underneath the IPG or next to the IPG.  It was not a lateral x-ray taken.   Patient has not self catheterize yet but she thinks she probably needs to.  When she turned it up she feels it in the vagina and knows how to change programs.     PMH:      Past Medical History:  Diagnosis Date   Adrenal adenoma, right     Asthma     Chronic pain syndrome     COVID-09 August 2019 and January 2022   History of pelvic fracture     History of supraventricular tachycardia      PER PT ON 02-28-2013 NO ISSUES SINCE ABLATION IN 2005   Injury of pelvis or lower limb peripheral nerve, late effect      HX PELVIC FX-- RESIDUAL URINARY  RETENTION   Magnesium metabolism disorder NEPHROLOGIST-  DR [name] ([facility] [place])    [facility] SYNDROME   Malfunction of device      INTERSTIM   Neurogenic bladder     PONV (postoperative nausea and vomiting)      reports PONV and rash with propofol   S/P ablation of ventricular arrhythmia HX SVT--  2005    [facility]--  LAST VISIT 2012 -- PT RELEASED FROM CARE ON PRN BASIS   Self-catheterizes urinary bladder     Urinary retention      RESIDUAL FROM PELVIC FX INJURY---Self cath TID and prn   Wears contact lenses        Surgical History:      Past Surgical History:  Procedure Laterality Date   APPENDECTOMY   1994    EXPL. LAP.   CARDIAC ELECTROPHYSIOLOGY STUDY AND ABLATION   2005- AT [facility]    SVT--  PT STATES NO ISSUES SINCE    ESOPHAGOGASTRODUODENOSCOPY (EGD) WITH PROPOFOL N/A 11/02/2017    Procedure: ESOPHAGOGASTRODUODENOSCOPY (EGD) WITH PROPOFOL;  Surgeon: Lucilla Lame, MD;  Location: [place];  Service: Endoscopy;  Laterality: N/A;   INTERSTIM IMPLANT PLACEMENT   2009[facility]   INTERSTIM IMPLANT PLACEMENT   10/05/2011    Procedure: Barrie Lyme IMPLANT FIRST STAGE;  Surgeon: [name], MD;  Location: [facility];  Service: Urology;  Laterality: N/A;   INTERSTIM IMPLANT PLACEMENT   10/05/2011    Procedure: Barrie Lyme IMPLANT SECOND STAGE;  Surgeon: [name], MD;  Location: [facility];  Service: Urology;  Laterality: N/A;   INTERSTIM IMPLANT PLACEMENT N/A 11/13/2016    Procedure: Barrie Lyme IMPLANT FIRST  STAGE;  Surgeon: [name], MD;  Location: [facility];  Service: Urology;  Laterality: N/A;   INTERSTIM IMPLANT PLACEMENT N/A 11/13/2016    Procedure: Barrie Lyme IMPLANT SECOND STAGE;  Surgeon: [name], MD;  Location: [facility];  Service: Urology;  Laterality: N/A;   INTERSTIM IMPLANT REMOVAL N/A 11/13/2016    Procedure: REMOVAL OF INTERSTIM IMPLANT ONE AND TWO;  Surgeon: [name], MD;  Location: [facility];  Service: Urology;  Laterality: N/A;   INTERSTIM IMPLANT REVISION N/A 03/06/2013    Procedure: REVISION OF Barrie Lyme;  Surgeon: [name], MD;  Location: [facility];  Service: Urology;  Laterality: N/A;   LAPAROSCOPIC CHOLECYSTECTOMY   2001   TONSILLECTOMY   1998   TOTAL VAGINAL HYSTERECTOMY   2003    by patient report due to uterine prolapse. Advised she does not need CCa Screening      Home Medications:  Allergies as of 07/03/2022         Reactions    Advair Diskus [fluticasone-salmeterol] Shortness Of Breath    Albuterol Sulfate Shortness Of Breath    Atrovent Shortness Of Breath    Bethanechol Shortness Of Breath, Rash, Other (See Comments)    RESPIRATORY DISTRESS    Budesonide-formoterol Fumarate Shortness Of Breath    Influenza Vac Split Quad Anaphylaxis    Influenza Vaccines Anaphylaxis, Shortness Of Breath    Ipratropium Shortness Of Breath    Ondansetron Rash    Shellfish Allergy Anaphylaxis, Shortness Of Breath    Xopenex [levalbuterol] Shortness Of Breath    Compazine Other (See Comments)    DYSTONIC    Fluoride Preparations      Lorazepam Other (See Comments)    "HAS OPPOSITE EFFECT"    Levofloxacin Rash  Other Rash    DYSTONIC    Potassium-containing Compounds Nausea And Vomiting    Just the po potassium  No problem with iv    Propofol Nausea And Vomiting, Rash    Zofran [ondansetron Hcl] Rash            Medication List           Accurate as of July 03, 2022  3:08 PM. If you have any questions, ask your nurse or doctor.              baclofen 20 MG tablet Commonly known as: LIORESAL TAKE 1 TABLET BY MOUTH 4 TIMES DAILY.    celecoxib 100 MG capsule Commonly known as: CELEBREX TAKE 1 CAPSULE BY MOUTH TWICE A DAY    EPINEPHrine 0.3 mg/0.3 mL Soaj injection Commonly known as: EPI-PEN Inject into the muscle.    esomeprazole 40 MG capsule Commonly known as: NEXIUM TAKE 1 CAPSULE BY MOUTH EVERY DAY BEFORE BREAKFAST    Klor-Con M20 20 MEQ tablet Generic drug: potassium chloride SA TAKE 1 TABLET BY MOUTH 3 TIMES DAILY.    levothyroxine 75 MCG tablet Commonly known as: SYNTHROID Take 1 tablet (75 mcg total) by mouth daily before breakfast.    magnesium oxide 400 (241.3 Mg) MG tablet Commonly known as: MAG-OX Take 1 tablet (400 mg total) by mouth 2 (two) times daily. What changed: when to take this    metaproterenol 20 MG tablet Commonly known as: ALUPENT Take 1 tablet (20 mg total) by mouth 3 (three) times daily as needed.    promethazine 25 MG tablet Commonly known as: PHENERGAN TAKE 1 TABLET (25 MG TOTAL) BY MOUTH EVERY 6 (SIX) HOURS AS NEEDED. FOR NAUSEA    tiZANidine 4 MG tablet Commonly known as: ZANAFLEX TAKE 1 TABLET (4 MG TOTAL) BY MOUTH 2 (TWO) TIMES DAILY.    Vitamin D3 25 MCG (1000 UT) Caps Take 1 capsule (1,000 Units total) by mouth daily.             Allergies:       Allergies  Allergen Reactions   Advair Diskus [Fluticasone-Salmeterol] Shortness Of Breath   Albuterol Sulfate Shortness Of Breath   Atrovent Shortness Of Breath   Bethanechol Shortness Of Breath, Rash and Other (See Comments)      RESPIRATORY DISTRESS   Budesonide-Formoterol Fumarate Shortness Of Breath   Influenza Vac Split Quad Anaphylaxis   Influenza Vaccines Anaphylaxis and Shortness Of Breath   Ipratropium Shortness Of Breath   Ondansetron Rash   Shellfish Allergy Anaphylaxis and Shortness Of Breath   Xopenex [Levalbuterol]  Shortness Of Breath   Compazine Other (See Comments)      DYSTONIC   Fluoride Preparations     Lorazepam Other (See Comments)      "HAS OPPOSITE EFFECT"   Levofloxacin Rash   Other Rash      DYSTONIC   Potassium-Containing Compounds Nausea And Vomiting      Just the po potassium  No problem with iv   Propofol Nausea And Vomiting and Rash   Zofran [Ondansetron Hcl] Rash      Family History:      Family History  Problem Relation Age of Onset   Hypertension Father     Colon cancer Neg Hx        Social History:  reports that she has never smoked. She has never used smokeless tobacco. She reports that she does not drink alcohol and does not use drugs.  ROS:                           Physical Exam: There were no vitals taken for this visit.  Constitutional:  Alert and oriented, No acute distress.     Laboratory Data: Recent Labs       Lab Results  Component Value Date    WBC 9.7 08/09/2018    HGB 14.0 08/09/2018    HCT 42.4 08/09/2018    MCV 90 08/09/2018    PLT 338 08/09/2018        Recent Labs       Lab Results  Component Value Date    CREATININE 0.86 06/14/2021        Recent Labs  No results found for: "PSA"     Recent Labs  No results found for: "TESTOSTERONE"     Recent Labs  No results found for: "HGBA1C"     Urinalysis Labs (Brief)          Component Value Date/Time    APPEARANCEUR Clear 10/10/2016 1428    GLUCOSEU Negative 10/10/2016 1428    BILIRUBINUR Negative 06/14/2021 1534    BILIRUBINUR Negative 10/10/2016 1428    PROTEINUR Positive (A) 06/14/2021 1534    PROTEINUR Negative 10/10/2016 1428    UROBILINOGEN 0.2 06/14/2021 1534    NITRITE Negative 06/14/2021 1534    NITRITE Negative 10/10/2016 1428    LEUKOCYTESUR Moderate (2+) (A) 06/14/2021 1534    LEUKOCYTESUR Negative 10/10/2016 1428        Pertinent Imaging:     Assessment & Plan: It is difficult to say why the patient is having discomfort.  I do not see any  local tissue effects.  In regards to function she always has such a narrow margin and not working or not working.  She is also very petite and is no longer 1 can feel the lead.  I am afraid if it is replaced she may not get the same benefit or she still could have similar pain.  I will have the [employer] rep troubleshoot based upon its complexity.    Patient understands my concern of replacing it and it not working or she still has pain.  She understands increased risk of infection.  She understands that she still petite is not a lot of room for the device.  We will schedule complete replacement like we have had before but we will have the [employer] rep troubleshoot her prior.  We will do this at [facility] to make certain we have a consistent team.  I will get a lateral x-ray of the pelvis today   Patient understands the concept of ongoing neuromodulation and scar tissue and how effects may not be reproducible

## 2022-07-11 NOTE — Progress Notes (Addendum)
COVID Vaccine Completed:no  Date of COVID positive in last 90 days: no  PCP - [place] Pink, MD Cardiologist - n/a  Chest x-ray - n/a EKG - n/a Stress Test - 27 years ago for tachycardia ECHO - 27 years ago Cardiac Cath - 27 years ago Pacemaker/ICD device last checked: n/a Spinal Cord Stimulator: pt instructed  to bring remote   Bowel Prep - no  Sleep Study - no CPAP -   Fasting Blood Sugar - n/a Checks Blood Sugar _____ times a day  Last dose of GLP1 agonist-  N/A GLP1 instructions:  N/A   Last dose of SGLT-2 inhibitors-  N/A SGLT-2 instructions: N/A   Blood Thinner Instructions: n/a Aspirin Instructions: Last Dose:  Activity level: Can go up a flight of stairs and perform activities of daily living without stopping and without symptoms of chest pain or shortness of breath.   Anesthesia review:   Patient denies shortness of breath, fever, cough and chest pain at PAT appointment  Patient verbalized understanding of instructions that were given to them at the PAT appointment. Patient was also instructed that they will need to review over the PAT instructions again at home before surgery.

## 2022-07-12 ENCOUNTER — Encounter (HOSPITAL_COMMUNITY): Payer: Self-pay

## 2022-07-12 ENCOUNTER — Encounter (HOSPITAL_COMMUNITY)
Admission: RE | Admit: 2022-07-12 | Discharge: 2022-07-12 | Disposition: A | Discharge status: Home / Self Care | Payer: BC Managed Care – PPO | Source: Ambulatory Visit | Attending: Urology | Admitting: Urology

## 2022-07-12 VITALS — BP 144/88 | HR 75 | Temp 98.1°F | Resp 12 | Ht 62.0 in | Wt 107.0 lb

## 2022-07-12 DIAGNOSIS — Z01818 Encounter for other preprocedural examination: Secondary | ICD-10-CM | POA: Insufficient documentation

## 2022-07-12 HISTORY — DX: Pneumonia, unspecified organism: J18.9

## 2022-07-12 HISTORY — DX: Unspecified osteoarthritis, unspecified site: M19.90

## 2022-07-12 HISTORY — DX: Personal history of urinary calculi: Z87.442

## 2022-07-12 LAB — CBC
HCT: 45.5 % (ref 36.0–46.0)
Hemoglobin: 14.7 g/dL (ref 12.0–15.0)
MCH: 30.1 pg (ref 26.0–34.0)
MCHC: 32.3 g/dL (ref 30.0–36.0)
MCV: 93 fL (ref 80.0–100.0)
Platelets: 322 10*3/uL (ref 150–400)
RBC: 4.89 MIL/uL (ref 3.87–5.11)
RDW: 13.6 % (ref 11.5–15.5)
WBC: 8.6 10*3/uL (ref 4.0–10.5)
nRBC: 0 % (ref 0.0–0.2)

## 2022-07-17 NOTE — Anesthesia Preprocedure Evaluation (Signed)
Anesthesia Evaluation  Patient identified by MRN, date of birth, ID band Patient awake    Reviewed: Allergy & Precautions, H&P , NPO status , Patient's Chart, lab work & pertinent test results  History of Anesthesia Complications (+) PONV and history of anesthetic complications  Airway Mallampati: II  TM Distance: >3 FB Neck ROM: Full    Dental no notable dental hx. (+) Teeth Intact, Dental Advisory Given   Pulmonary asthma    Pulmonary exam normal breath sounds clear to auscultation       Cardiovascular Exercise Tolerance: Good negative cardio ROS Supra Ventricular Tachycardia  Rhythm:Regular Rate:Normal     Neuro/Psych negative neurological ROS  negative psych ROS   GI/Hepatic Neg liver ROS,GERD  Medicated,,  Endo/Other  Hypothyroidism    Renal/GU negative Renal ROS  negative genitourinary   Musculoskeletal  (+) Arthritis , Osteoarthritis,    Abdominal   Peds  Hematology negative hematology ROS (+)   Anesthesia Other Findings   Reproductive/Obstetrics negative OB ROS                             Anesthesia Physical Anesthesia Plan  ASA: 3  Anesthesia Plan: General   Post-op Pain Management: Tylenol PO (pre-op)*   Induction: Intravenous  PONV Risk Score and Plan: 4 or greater and Ondansetron, Dexamethasone and Midazolam  Airway Management Planned: Oral ETT  Additional Equipment:   Intra-op Plan:   Post-operative Plan: Extubation in OR  Informed Consent: I have reviewed the patients History and Physical, chart, labs and discussed the procedure including the risks, benefits and alternatives for the proposed anesthesia with the patient or authorized representative who has indicated his/her understanding and acceptance.       Plan Discussed with: CRNA and Surgeon  Anesthesia Plan Comments:        Anesthesia Quick Evaluation

## 2022-07-18 ENCOUNTER — Encounter (HOSPITAL_COMMUNITY)
Admission: RE | Disposition: A | Discharge status: Home / Self Care | Payer: Self-pay | Source: Home / Self Care | Attending: Urology

## 2022-07-18 ENCOUNTER — Other Ambulatory Visit: Payer: Self-pay

## 2022-07-18 ENCOUNTER — Ambulatory Visit (HOSPITAL_COMMUNITY)
Admission: RE | Admit: 2022-07-18 | Discharge: 2022-07-18 | Disposition: A | Discharge status: Home / Self Care | Payer: BC Managed Care – PPO | Attending: Urology | Admitting: Urology

## 2022-07-18 ENCOUNTER — Ambulatory Visit (HOSPITAL_COMMUNITY): Payer: BC Managed Care – PPO

## 2022-07-18 ENCOUNTER — Ambulatory Visit (HOSPITAL_COMMUNITY): Payer: BC Managed Care – PPO | Admitting: Certified Registered Nurse Anesthetist

## 2022-07-18 ENCOUNTER — Encounter (HOSPITAL_COMMUNITY): Payer: Self-pay | Admitting: Urology

## 2022-07-18 DIAGNOSIS — M1990 Unspecified osteoarthritis, unspecified site: Secondary | ICD-10-CM | POA: Diagnosis not present

## 2022-07-18 DIAGNOSIS — R338 Other retention of urine: Secondary | ICD-10-CM | POA: Diagnosis not present

## 2022-07-18 DIAGNOSIS — Z01818 Encounter for other preprocedural examination: Secondary | ICD-10-CM

## 2022-07-18 DIAGNOSIS — T85191D Other mechanical complication of implanted electronic neurostimulator (electrode) of peripheral nerve, subsequent encounter: Secondary | ICD-10-CM | POA: Diagnosis not present

## 2022-07-18 DIAGNOSIS — T85193A Other mechanical complication of implanted electronic neurostimulator, generator, initial encounter: Secondary | ICD-10-CM | POA: Diagnosis not present

## 2022-07-18 DIAGNOSIS — R339 Retention of urine, unspecified: Secondary | ICD-10-CM | POA: Diagnosis not present

## 2022-07-18 DIAGNOSIS — J45909 Unspecified asthma, uncomplicated: Secondary | ICD-10-CM | POA: Diagnosis not present

## 2022-07-18 DIAGNOSIS — E039 Hypothyroidism, unspecified: Secondary | ICD-10-CM | POA: Insufficient documentation

## 2022-07-18 DIAGNOSIS — T83110A Breakdown (mechanical) of urinary electronic stimulator device, initial encounter: Secondary | ICD-10-CM | POA: Insufficient documentation

## 2022-07-18 DIAGNOSIS — T85193D Other mechanical complication of implanted electronic neurostimulator, generator, subsequent encounter: Secondary | ICD-10-CM | POA: Diagnosis not present

## 2022-07-18 DIAGNOSIS — I471 Supraventricular tachycardia, unspecified: Secondary | ICD-10-CM | POA: Diagnosis not present

## 2022-07-18 DIAGNOSIS — K219 Gastro-esophageal reflux disease without esophagitis: Secondary | ICD-10-CM | POA: Insufficient documentation

## 2022-07-18 HISTORY — PX: INTERSTIM IMPLANT PLACEMENT: SHX5130

## 2022-07-18 SURGERY — INSERTION, SACRAL NERVE STIMULATOR, INTERSTIM, STAGE 1
Anesthesia: General

## 2022-07-18 MED ORDER — VANCOMYCIN HCL IN DEXTROSE 1-5 GM/200ML-% IV SOLN
1000.0000 mg | INTRAVENOUS | Status: AC
  Administered 2022-07-18: 1000 mg via INTRAVENOUS
  Filled 2022-07-18: qty 200

## 2022-07-18 MED ORDER — SUCCINYLCHOLINE CHLORIDE 200 MG/10ML IV SOSY
PREFILLED_SYRINGE | INTRAVENOUS | Status: DC | PRN
  Administered 2022-07-18: 80 mg via INTRAVENOUS

## 2022-07-18 MED ORDER — LIDOCAINE 2% (20 MG/ML) 5 ML SYRINGE
INTRAMUSCULAR | Status: DC | PRN
  Administered 2022-07-18: 60 mg via INTRAVENOUS

## 2022-07-18 MED ORDER — ACETAMINOPHEN 500 MG PO TABS
1000.0000 mg | ORAL_TABLET | Freq: Once | ORAL | Status: DC
  Filled 2022-07-18: qty 2

## 2022-07-18 MED ORDER — PHENYLEPHRINE 80 MCG/ML (10ML) SYRINGE FOR IV PUSH (FOR BLOOD PRESSURE SUPPORT)
PREFILLED_SYRINGE | INTRAVENOUS | Status: DC | PRN
  Administered 2022-07-18 (×2): 80 ug via INTRAVENOUS

## 2022-07-18 MED ORDER — ORAL CARE MOUTH RINSE: 15.0000 mL | Freq: Once | OROMUCOSAL | Status: AC

## 2022-07-18 MED ORDER — MIDAZOLAM HCL 2 MG/2ML IJ SOLN
INTRAMUSCULAR | Status: AC
  Filled 2022-07-18: qty 2

## 2022-07-18 MED ORDER — CHLORHEXIDINE GLUCONATE 0.12 % MT SOLN
15.0000 mL | Freq: Once | OROMUCOSAL | Status: AC
  Administered 2022-07-18: 15 mL via OROMUCOSAL

## 2022-07-18 MED ORDER — HYDROMORPHONE HCL 1 MG/ML IJ SOLN
INTRAMUSCULAR | Status: AC
  Filled 2022-07-18: qty 1

## 2022-07-18 MED ORDER — DEXAMETHASONE SODIUM PHOSPHATE 10 MG/ML IJ SOLN
INTRAMUSCULAR | Status: DC | PRN
  Administered 2022-07-18: 10 mg via INTRAVENOUS

## 2022-07-18 MED ORDER — LIDOCAINE-EPINEPHRINE (PF) 1 %-1:200000 IJ SOLN
INTRAMUSCULAR | Status: AC
  Filled 2022-07-18: qty 30

## 2022-07-18 MED ORDER — FENTANYL CITRATE (PF) 250 MCG/5ML IJ SOLN
INTRAMUSCULAR | Status: AC
  Filled 2022-07-18: qty 5

## 2022-07-18 MED ORDER — PROPOFOL 10 MG/ML IV BOLUS
INTRAVENOUS | Status: AC
  Filled 2022-07-18: qty 20

## 2022-07-18 MED ORDER — PROPOFOL 10 MG/ML IV BOLUS
INTRAVENOUS | Status: DC | PRN
  Administered 2022-07-18: 100 mg via INTRAVENOUS

## 2022-07-18 MED ORDER — SODIUM CHLORIDE 0.9 % IV SOLN: INTRAVENOUS | Status: DC

## 2022-07-18 MED ORDER — MIDAZOLAM HCL 5 MG/5ML IJ SOLN
INTRAMUSCULAR | Status: DC | PRN
  Administered 2022-07-18: 2 mg via INTRAVENOUS

## 2022-07-18 MED ORDER — ACETAMINOPHEN 10 MG/ML IV SOLN
INTRAVENOUS | Status: AC
  Filled 2022-07-18: qty 100

## 2022-07-18 MED ORDER — ACETAMINOPHEN 10 MG/ML IV SOLN
INTRAVENOUS | Status: DC | PRN
  Administered 2022-07-18: 1000 mg via INTRAVENOUS

## 2022-07-18 MED ORDER — ONDANSETRON HCL 4 MG/2ML IJ SOLN
INTRAMUSCULAR | Status: AC
  Filled 2022-07-18: qty 2

## 2022-07-18 MED ORDER — PROMETHAZINE HCL 25 MG/ML IJ SOLN
12.5000 mg | Freq: Once | INTRAMUSCULAR | Status: AC
  Administered 2022-07-18: 12.5 mg via INTRAVENOUS

## 2022-07-18 MED ORDER — SULFAMETHOXAZOLE-TRIMETHOPRIM 800-160 MG PO TABS: 1.0000 | ORAL_TABLET | Freq: Two times a day (BID) | ORAL | 0 refills | Status: DC

## 2022-07-18 MED ORDER — FENTANYL CITRATE (PF) 100 MCG/2ML IJ SOLN
INTRAMUSCULAR | Status: DC | PRN
  Administered 2022-07-18: 50 ug via INTRAVENOUS
  Administered 2022-07-18: 100 ug via INTRAVENOUS

## 2022-07-18 MED ORDER — ONDANSETRON HCL 4 MG/2ML IJ SOLN
INTRAMUSCULAR | Status: DC | PRN
  Administered 2022-07-18: 4 mg via INTRAVENOUS

## 2022-07-18 MED ORDER — STERILE WATER FOR IRRIGATION IR SOLN
Status: DC | PRN
  Administered 2022-07-18: 1000 mL

## 2022-07-18 MED ORDER — PROMETHAZINE HCL 25 MG/ML IJ SOLN
INTRAMUSCULAR | Status: AC
  Filled 2022-07-18: qty 1

## 2022-07-18 MED ORDER — BUPIVACAINE-EPINEPHRINE 0.5% -1:200000 IJ SOLN
INTRAMUSCULAR | Status: DC | PRN
  Administered 2022-07-18: 20 mL

## 2022-07-18 MED ORDER — BUPIVACAINE-EPINEPHRINE (PF) 0.5% -1:200000 IJ SOLN
INTRAMUSCULAR | Status: AC
  Filled 2022-07-18: qty 30

## 2022-07-18 MED ORDER — HYDROMORPHONE HCL 1 MG/ML IJ SOLN
0.2500 mg | INTRAMUSCULAR | Status: DC | PRN
  Administered 2022-07-18 (×2): 0.5 mg via INTRAVENOUS

## 2022-07-18 SURGICAL SUPPLY — 60 items
ADH SKN CLS APL DERMABOND .7 (GAUZE/BANDAGES/DRESSINGS) ×1
APL PRP STRL LF DISP 70% ISPRP (MISCELLANEOUS) ×1
APL SKNCLS STERI-STRIP NONHPOA (GAUZE/BANDAGES/DRESSINGS)
BAG COUNTER SPONGE SURGICOUNT (BAG) IMPLANT
BAG SPNG CNTER NS LX DISP (BAG)
BELT PT INTERSTIM MICRO SYSTEM (MISCELLANEOUS) ×1 IMPLANT
BELT SPRT LRG NS LF REUSE (MISCELLANEOUS) ×1
BENZOIN TINCTURE PRP APPL 2/3 (GAUZE/BANDAGES/DRESSINGS) IMPLANT
BLADE HEX COATED 2.75 (ELECTRODE) ×1 IMPLANT
BLADE SURG 15 STRL LF DISP TIS (BLADE) ×1 IMPLANT
BLADE SURG 15 STRL SS (BLADE) ×1
BLADE SURG SZ10 CARB STEEL (BLADE) IMPLANT
CHLORAPREP W/TINT 26 (MISCELLANEOUS) ×1 IMPLANT
COVER TRANSDUCER ULTRASND GEL (DISPOSABLE) ×1 IMPLANT
DERMABOND ADVANCED .7 DNX12 (GAUZE/BANDAGES/DRESSINGS) IMPLANT
DRAPE C-ARM 42X120 X-RAY (DRAPES) ×1 IMPLANT
DRAPE C-ARMOR (DRAPES) ×1 IMPLANT
DRAPE INCISE 23X17 IOBAN STRL (DRAPES) ×1
DRAPE INCISE 23X17 STRL (DRAPES) ×1 IMPLANT
DRAPE INCISE IOBAN 23X17 STRL (DRAPES) ×1 IMPLANT
DRAPE LAPAROSCOPIC ABDOMINAL (DRAPES) ×1 IMPLANT
DRAPE SHEET LG 3/4 BI-LAMINATE (DRAPES) IMPLANT
DRSG TEGADERM 2-3/8X2-3/4 SM (GAUZE/BANDAGES/DRESSINGS) ×1 IMPLANT
DRSG TEGADERM 4X4.75 (GAUZE/BANDAGES/DRESSINGS) ×2 IMPLANT
DRSG TELFA 3X8 NADH STRL (GAUZE/BANDAGES/DRESSINGS) IMPLANT
DRSG TELFA PLUS 4X6 ADH ISLAND (GAUZE/BANDAGES/DRESSINGS) ×1 IMPLANT
GAUZE 4X4 16PLY ~~LOC~~+RFID DBL (SPONGE) ×1 IMPLANT
GAUZE SPONGE 4X4 12PLY STRL (GAUZE/BANDAGES/DRESSINGS) ×1 IMPLANT
GLOVE SURG LX STRL 7.5 STRW (GLOVE) ×2 IMPLANT
GOWN STRL REUS W/ TWL XL LVL3 (GOWN DISPOSABLE) ×1 IMPLANT
GOWN STRL REUS W/TWL XL LVL3 (GOWN DISPOSABLE) ×1
KIT BASIN OR (CUSTOM PROCEDURE TRAY) ×1 IMPLANT
KIT HANDSET INTERSTIM COMM (NEUROSURGERY SUPPLIES) ×1 IMPLANT
KIT RECHARGE INTERSTIM (NEUROSURGERY SUPPLIES) ×1 IMPLANT
LEAD INTERSTIM 4.32 28 L (Lead) IMPLANT
NDL FORAMN 5 20GA (NEUROSURGERY SUPPLIES) IMPLANT
NDL HYPO 25X1 1.5 SAFETY (NEEDLE) ×1 IMPLANT
NEEDLE FORAMN 5 20GA (NEUROSURGERY SUPPLIES) IMPLANT
NEEDLE HYPO 25X1 1.5 SAFETY (NEEDLE) ×1 IMPLANT
NEUROSTIMULATOR 1.7X2X.06 (UROLOGICAL SUPPLIES) ×1 IMPLANT
NS IRRIG 1000ML POUR BTL (IV SOLUTION) ×1 IMPLANT
PACK BASIC VI WITH GOWN DISP (CUSTOM PROCEDURE TRAY) ×1 IMPLANT
PACK GENERAL/GYN (CUSTOM PROCEDURE TRAY) ×1 IMPLANT
PENCIL SMOKE EVACUATOR (MISCELLANEOUS) IMPLANT
SPIKE FLUID TRANSFER (MISCELLANEOUS) ×1 IMPLANT
STAPLER VISISTAT 35W (STAPLE) IMPLANT
STIMULATOR INTERSTIM 2X1.7X.3 (Miscellaneous) IMPLANT
SUT MNCRL AB 4-0 PS2 18 (SUTURE) ×2 IMPLANT
SUT SILK 2 0 (SUTURE)
SUT SILK 2 0 SH (SUTURE) IMPLANT
SUT SILK 2-0 18XBRD TIE 12 (SUTURE) IMPLANT
SUT VIC AB 2-0 UR6 27 (SUTURE) ×2 IMPLANT
SUT VIC AB 3-0 SH 27 (SUTURE) ×2
SUT VIC AB 3-0 SH 27X BRD (SUTURE) ×2 IMPLANT
SUT VIC AB 4-0 PS2 27 (SUTURE) ×2 IMPLANT
SYR CONTROL 10ML LL (SYRINGE) ×1 IMPLANT
TOWEL OR 17X26 10 PK STRL BLUE (TOWEL DISPOSABLE) ×1 IMPLANT
TOWEL OR NON WOVEN STRL DISP B (DISPOSABLE) ×1 IMPLANT
TRAY FOLEY MTR SLVR 16FR STAT (SET/KITS/TRAYS/PACK) IMPLANT
WATER STERILE IRR 1000ML POUR (IV SOLUTION) ×1 IMPLANT

## 2022-07-18 NOTE — Anesthesia Postprocedure Evaluation (Signed)
Anesthesia Post Note  Patient: Kayla Curry  Procedure(s) Performed: REPLACE INTERSTIM IMPLANT FIRST STAGE REPLACE INTERSTIM IMPLANT SECOND STAGE AND IMPEDANCE CHECK     Patient location during evaluation: PACU Anesthesia Type: General Level of consciousness: awake and alert Pain management: pain level controlled Vital Signs Assessment: post-procedure vital signs reviewed and stable Respiratory status: spontaneous breathing, nonlabored ventilation and respiratory function stable Cardiovascular status: blood pressure returned to baseline and stable Postop Assessment: no apparent nausea or vomiting Anesthetic complications: no  No notable events documented.  Last Vitals:  Vitals:   07/18/22 1034 07/18/22 1045  BP: (!) 169/96 (!) 176/96  Pulse: 80 72  Resp: 18   Temp: 36.5 C   SpO2: 98% 99%    Last Pain:  Vitals:   07/18/22 1100  TempSrc:   PainSc: 3                  Soliana Kitko,W. EDMOND

## 2022-07-18 NOTE — Discharge Instructions (Signed)
I have reviewed discharge instructions in detail with the patient. They will follow-up with me or their physician as scheduled. My nurse will also be calling the patients as per protocol.   

## 2022-07-18 NOTE — Op Note (Signed)
Preoperative diagnosis: Malfunctioning InterStim and refractory idiopathic urinary retention Postoperative diagnosis malfunctioning InterStim and refractory idiopathic urinary retention Surgery: Removal of InterStim; stage I and stage II implantation of InterStim and impedance check Surgeon: Dr. Nicki Reaper McDiarmid   The patient has the above diagnoses and consented to the above procedure. The patient was prepped and draped in the usual fashion.  Extra care was taken with positioning.  She has multiple allergies and was given preoperative antibiotics.  Fluoroscopy palpation and soft tissue landmarks were used to mark the position of the IPG and lead on the patient's right side.  I instilled 10 cc of a Marcaine epinephrine mixture over the right buttock incision and IPG that was quite medial overlying her sacrum.  I made a scalpel incision and dissected down opening the pseudocapsule easily removing the IPG.  A hemostat was placed across the lead cutting the lead and removing the IPG.  I made 3 linear incisions in the posterior wall of the right side at pseudocapsule.  When I would tug on the lead I could easily see it medial pulling on the skin under her midline incision on the right side also identified fluoroscopically  I placed approximately 5 cc of Marcaine epinephrine mixture in this area and made a 2 and half centimeter incision in the area of the previous incision.  I carefully dissected down very thin subcutaneous tissue and mobilized the lead with scissors.  I mobilized down to the bony table.  Under fluoroscopic guidance I easily removed the lead in total with no retained tip  It was then easy to find the S3 foramina on the right with a 3.5 inch foramen needle with excellent toe and bellows response at low amplitude.  The inner aspect of foramen needle was removed and guide was placed to appropriate depth.  White trocar was placed to the appropriate depth.  Well-prepared Coude shaped lead was placed  to the appropriate depth and she had excellent toe and bellows response at low amplitude at all 4-lead positions.  Hardcopy x-rays were taken  She is petite and her IPG was overlying bone.  The lead was curled up and she could feel the lead underneath the skin.  Preoperative physical examination was unchanged.  I thought was her best interest to bring the IPG to her left side into a soft tissue area away from bone.  This should prevent tenting of the lead.  This was discussed preoperatively to the patient.  I marked a 4.5 cm left upper buttock incision very carefully marking bony and soft tissue landmarks.  I was very pleased with the position and I tended to keep it a little bit higher because she is so petite and I did not want her to sit on it.  I placed 10 cc of Marcaine epinephrine mixture.  I made the appropriate depth incision.  The IPG pocket was easily made with cautery and finger dissection.  I was careful not to oversize it.  I brought the lead from medial to lateral with the passer.  It was connected to the IPG and attached with screwdriver  Impedance check was done and was normal in all 4-lead positions.  The IPG laid in very nicely and incision was tension-free.  I closed both larger incisions with running 3-0 Vicryl subcuticular tissue followed by 4-0 Monocryl.  I closed the midline incision with 3-0 Vicryl interrupted subcuticular followed by 4-0 interrupted Vicryl.  Sterile dressing was applied.  I was exceptionally happy with the procedure  and also lead placement with no further tenting in the midline.

## 2022-07-18 NOTE — Interval H&P Note (Signed)
History and Physical Interval Note:  07/18/2022 7:13 AM  [name]  has presented today for surgery, with the diagnosis of malfunctioning interstim.  The various methods of treatment have been discussed with the patient and family. After consideration of risks, benefits and other options for treatment, the patient has consented to  Procedure(s) with comments: REPLACE INTERSTIM IMPLANT FIRST STAGE (N/A) REPLACE INTERSTIM IMPLANT SECOND STAGE AND IMPEDANCE CHECK (N/A) - 90 MINS FOR CASE as a surgical intervention.  The patient's history has been reviewed, patient examined, no change in status, stable for surgery.  I have reviewed the patient's chart and labs.  Questions were answered to the patient's satisfaction.     Nnaemeka Samson A Tayjah Lobdell

## 2022-07-18 NOTE — Transfer of Care (Signed)
Immediate Anesthesia Transfer of Care Note  Patient: Kayla Curry  Procedure(s) Performed: REPLACE INTERSTIM IMPLANT FIRST STAGE REPLACE INTERSTIM IMPLANT SECOND STAGE AND IMPEDANCE CHECK  Patient Location: PACU  Anesthesia Type:General  Level of Consciousness: awake, alert , and oriented  Airway & Oxygen Therapy: Patient Spontanous Breathing and Patient connected to face mask oxygen  Post-op Assessment: Report given to RN and Post -op Vital signs reviewed and stable  Post vital signs: Reviewed and stable  Last Vitals:  Vitals Value Taken Time  BP    Temp    Pulse    Resp    SpO2      Last Pain:  Vitals:   07/18/22 0615  TempSrc: Oral  PainSc: 7          Complications: No notable events documented.

## 2022-07-18 NOTE — Anesthesia Procedure Notes (Signed)
Procedure Name: Intubation Date/Time: 07/18/2022 7:55 AM  Performed by: Gean Maidens, CRNAPre-anesthesia Checklist: Patient identified, Emergency Drugs available, Suction available, Patient being monitored and Timeout performed Patient Re-evaluated:Patient Re-evaluated prior to induction Oxygen Delivery Method: Circle system utilized Preoxygenation: Pre-oxygenation with 100% oxygen Induction Type: IV induction Ventilation: Mask ventilation without difficulty Laryngoscope Size: Mac and 3 Grade View: Grade I Tube type: Oral Tube size: 7.0 mm Number of attempts: 1 Airway Equipment and Method: Stylet Placement Confirmation: ETT inserted through vocal cords under direct vision, positive ETCO2 and breath sounds checked- equal and bilateral Secured at: 21 cm Tube secured with: Tape Dental Injury: Teeth and Oropharynx as per pre-operative assessment

## 2022-07-20 ENCOUNTER — Encounter (HOSPITAL_COMMUNITY): Payer: Self-pay | Admitting: Urology

## 2022-07-20 ENCOUNTER — Other Ambulatory Visit: Payer: Self-pay | Admitting: Family Medicine

## 2022-07-21 ENCOUNTER — Telehealth: Payer: Self-pay | Admitting: *Deleted

## 2022-07-21 NOTE — Telephone Encounter (Signed)
Pt is not able to come at the time available, pt states she will call [facility] and schedule with them. I advised it doesn't work like that but pt states she will make it happen.

## 2022-07-21 NOTE — Telephone Encounter (Signed)
Pt calling stating she had her interstim replaced and wanted to know when should she follow-up?

## 2022-07-24 ENCOUNTER — Ambulatory Visit (INDEPENDENT_AMBULATORY_CARE_PROVIDER_SITE_OTHER): Payer: BC Managed Care – PPO | Admitting: Urology

## 2022-07-24 ENCOUNTER — Encounter: Payer: Self-pay | Admitting: Urology

## 2022-07-24 VITALS — BP 154/85 | HR 91

## 2022-07-24 DIAGNOSIS — R339 Retention of urine, unspecified: Secondary | ICD-10-CM

## 2022-07-24 NOTE — Progress Notes (Signed)
07/24/2022 2:43 PM   Kayla Curry 1970-11-19 [id number]  Referring provider: Maryland Pink, MD [address] [facility] [place],  [place] [address]  No chief complaint on file.   HPI: Patient has complex incomplete bladder emptying and just had her InterStim replaced and it went very well on October 18, 2021  She is a bit sore in the back but she is a petite lady.  Incisions look perfect.  I had located the IPG to the other side more lateral and soft tissue first overlying the sacrum.  She is voiding well and very pleased PMH: Past Medical History:  Diagnosis Date   Adrenal adenoma, right    Arthritis    Asthma    Chronic pain syndrome    COVID-09 August 2019 and January 2022   GERD (gastroesophageal reflux disease)    History of kidney stones    History of pelvic fracture    History of supraventricular tachycardia    PER PT ON 02-28-2013 NO ISSUES SINCE ABLATION IN 2005   Hypothyroidism    Injury of pelvis or lower limb peripheral nerve, late effect    HX PELVIC FX-- RESIDUAL URINARY RETENTION   Magnesium metabolism disorder NEPHROLOGIST-  DR [name] ([facility] [place])   [facility]NDROME   Malfunction of device    INTERSTIM   Neurogenic bladder    Pneumonia    PONV (postoperative nausea and vomiting)    reports PONV and rash with propofol   S/P ablation of ventricular arrhythmia HX SVT--  2005   [place]--  LAST VISIT 2012 -- PT RELEASED FROM CARE ON PRN BASIS   Self-catheterizes urinary bladder    Urinary retention    RESIDUAL FROM PELVIC FX INJURY---Self cath TID and prn   Wears contact lenses     Surgical History: Past Surgical History:  Procedure Laterality Date   APPENDECTOMY  1994   EXPL. LAP.   CARDIAC ELECTROPHYSIOLOGY STUDY AND ABLATION  2005- AT [facility]   SVT--  PT STATES NO ISSUES SINCE    ESOPHAGOGASTRODUODENOSCOPY (EGD) WITH PROPOFOL N/A 11/02/2017   Procedure: ESOPHAGOGASTRODUODENOSCOPY (EGD) WITH  PROPOFOL;  Surgeon: Lucilla Lame, MD;  [place];  Service: Endoscopy;  Laterality: N/A;   INTERSTIM IMPL[facility] PLACEMENT  [facility]   INTERSTIM IMPLANT PLACEMENT  10/05/2011   Procedure: Barrie Lyme IMPLANT FIRST STAGE;  Surgeon: [name], MD;  Location: [facility];  Service: Urology;  Laterality: N/A;   INTERSTIM IMPLANT PLACEMENT  10/05/2011   Procedure: Barrie Lyme IMPLANT SECOND STAGE;  Surgeon: [name], MD;  Location: [facility];  Service: Urology;  Laterality: N/A;   INTERSTIM IMPLANT PLACEMENT N/A 11/13/2016   Procedure: Barrie Lyme IMPLANT FIRST STAGE;  Surgeon: [name], MD;  Location: [facility];  Service: Urology;  Laterality: N/A;   INTERSTIM IMPLANT PLACEMENT N/A 11/13/2016   Procedure: Barrie Lyme IMPLANT SECOND STAGE;  Surgeon: [name], MD;  Location: [facility];  Service: Urology;  Laterality: N/A;   INTERSTIM IMPLANT PLACEMENT N/A 07/18/2022   Procedure: REPLACE INTERSTIM IMPLANT FIRST STAGE;  Surgeon: [name], MD;  Location: [facility];  Service: Urology;  Laterality: N/A;   INTERSTIM IMPLANT PLACEMENT N/A 07/18/2022   Procedure: REPLACE INTERSTIM IMPLANT SECOND STAGE AND IMPEDANCE CHECK;  Surgeon: [name], MD;  Location: [facility];  Service: Urology;  Laterality: N/A;  90 MINS FOR CASE   INTERSTIM IMPLANT REMOVAL N/A 11/13/2016  Procedure: REMOVAL OF INTERSTIM IMPLANT ONE AND TWO;  Surgeon: [name], MD;  Location: [facility];  Service: Urology;  Laterality: N/A;   INTERSTIM IMPLANT REVISION N/A 03/06/2013   Procedure: REVISION OF Barrie Lyme;  Surgeon: [name], MD;  Location: [facility];  Service: Urology;  Laterality: N/A;   LAPAROSCOPIC CHOLECYSTECTOMY  2001   TONSILLECTOMY  1998   TOTAL VAGINAL HYSTERECTOMY  2003   by patient report due to uterine prolapse. Advised she does not need CCa  Screening    Home Medications:  Allergies as of 07/24/2022       Reactions   Advair Diskus [fluticasone-salmeterol] Shortness Of Breath   Albuterol Sulfate Shortness Of Breath   Atrovent Shortness Of Breath   Bethanechol Shortness Of Breath, Rash, Other (See Comments)   RESPIRATORY DISTRESS   Budesonide-formoterol Fumarate Shortness Of Breath   Influenza Vac Split Quad Anaphylaxis   Influenza Vaccines Anaphylaxis, Shortness Of Breath   Ipratropium Shortness Of Breath   Xopenex [levalbuterol] Shortness Of Breath   Compazine Other (See Comments)   DYSTONIC   Fluoride Preparations    Unknown reaction    Lorazepam Other (See Comments)   "HAS OPPOSITE EFFECT"   Levofloxacin Rash   Potassium-containing Compounds Nausea And Vomiting   Just the po potassium  No problem with iv. Tolerates tablets with phenergan    Propofol Nausea And Vomiting, Rash   Zofran [ondansetron Hcl] Rash        Medication List        Accurate as of July 24, 2022  2:43 PM. If you have any questions, ask your nurse or doctor.          acetaminophen 500 MG tablet Commonly known as: TYLENOL Take 1,000 mg by mouth every 6 (six) hours as needed for moderate pain.   baclofen 20 MG tablet Commonly known as: LIORESAL TAKE 1 TABLET BY MOUTH 4 TIMES DAILY.   celecoxib 100 MG capsule Commonly known as: CELEBREX TAKE 1 CAPSULE BY MOUTH TWICE A DAY   EPINEPHrine 0.3 mg/0.3 mL Soaj injection Commonly known as: EPI-PEN Inject 0.3 mg into the muscle as needed for anaphylaxis.   esomeprazole 40 MG capsule Commonly known as: NEXIUM TAKE 1 CAPSULE BY MOUTH EVERY DAY BEFORE BREAKFAST   Klor-Con M20 20 MEQ tablet Generic drug: potassium chloride SA TAKE 1 TABLET BY MOUTH 3 TIMES DAILY.   levothyroxine 75 MCG tablet Commonly known as: SYNTHROID Take 1 tablet (75 mcg total) by mouth daily before breakfast.   magnesium oxide 400 (241.3 Mg) MG tablet Commonly known as: MAG-OX Take 1 tablet (400 mg  total) by mouth 2 (two) times daily.   MAGNESIUM PO Take 1 tablet by mouth daily.   metaproterenol 20 MG tablet Commonly known as: ALUPENT Take 1 tablet (20 mg total) by mouth 3 (three) times daily as needed.   promethazine 25 MG tablet Commonly known as: PHENERGAN TAKE 1 TABLET (25 MG TOTAL) BY MOUTH EVERY 6 (SIX) HOURS AS NEEDED. FOR NAUSEA What changed:  when to take this additional instructions   sulfamethoxazole-trimethoprim 800-160 MG tablet Commonly known as: BACTRIM DS Take 1 tablet by mouth 2 (two) times daily.   tiZANidine 4 MG tablet Commonly known as: ZANAFLEX TAKE 1 TABLET (4 MG TOTAL) BY MOUTH 2 (TWO) TIMES DAILY. What changed:  how much to take how to take this when to take this reasons to take this additional instructions   VITAMIN D PO Take 1 capsule by mouth  daily.   Vitamin D3 25 MCG (1000 UT) Caps Take 1 capsule (1,000 Units total) by mouth daily.        Allergies:  Allergies  Allergen Reactions   Advair Diskus [Fluticasone-Salmeterol] Shortness Of Breath   Albuterol Sulfate Shortness Of Breath   Atrovent Shortness Of Breath   Bethanechol Shortness Of Breath, Rash and Other (See Comments)    RESPIRATORY DISTRESS   Budesonide-Formoterol Fumarate Shortness Of Breath   Influenza Vac Split Quad Anaphylaxis   Influenza Vaccines Anaphylaxis and Shortness Of Breath   Ipratropium Shortness Of Breath   Xopenex [Levalbuterol] Shortness Of Breath   Compazine Other (See Comments)    DYSTONIC   Fluoride Preparations     Unknown reaction    Lorazepam Other (See Comments)    "HAS OPPOSITE EFFECT"   Levofloxacin Rash   Potassium-Containing Compounds Nausea And Vomiting    Just the po potassium  No problem with iv. Tolerates tablets with phenergan    Propofol Nausea And Vomiting and Rash   Zofran [Ondansetron Hcl] Rash    Family History: Family History  Problem Relation Age of Onset   Hypertension Father    Colon cancer Neg Hx     Social  History:  reports that she has never smoked. She has never used smokeless tobacco. She reports current alcohol use. She reports that she does not use drugs.  ROS:                                        Physical Exam:   Laboratory Data: Lab Results  Component Value Date   WBC 8.6 07/12/2022   HGB 14.7 07/12/2022   HCT 45.5 07/12/2022   MCV 93.0 07/12/2022   PLT 322 07/12/2022    Lab Results  Component Value Date   CREATININE 0.86 06/14/2021    No results found for: "PSA"  No results found for: "TESTOSTERONE"  No results found for: "HGBA1C"  Urinalysis    Component Value Date/Time   APPEARANCEUR Clear 10/10/2016 1428   GLUCOSEU Negative 10/10/2016 1428   BILIRUBINUR Negative 06/14/2021 1534   BILIRUBINUR Negative 10/10/2016 1428   PROTEINUR Positive (A) 06/14/2021 1534   PROTEINUR Negative 10/10/2016 1428   UROBILINOGEN 0.2 06/14/2021 1534   NITRITE Negative 06/14/2021 1534   NITRITE Negative 10/10/2016 1428   LEUKOCYTESUR Moderate (2+) (A) 06/14/2021 1534   LEUKOCYTESUR Negative 10/10/2016 1428    Pertinent Imaging:   Assessment & Plan: Reassess durability in 4 months  There are no diagnoses linked to this encounter.  No follow-ups on file.  [name], MD  [facility] [address] [place], [place] [address] [phone]

## 2022-07-30 ENCOUNTER — Other Ambulatory Visit: Payer: Self-pay | Admitting: Family Medicine

## 2022-07-31 NOTE — Telephone Encounter (Signed)
Requested medications are due for refill today.  yes  Requested medications are on the active medications list.  yes  Last refill. 06/30/2022 #60 0 rf  Future visit scheduled.   no  Notes to clinic.  [place] Pink listed as pcp.     Requested Prescriptions  Pending Prescriptions Disp Refills   celecoxib (CELEBREX) 100 MG capsule [Pharmacy Med Name: CELECOXIB 100 MG CAPSULE] 60 capsule 0    Sig: TAKE 1 CAPSULE BY MOUTH TWICE A DAY     Analgesics:  COX2 Inhibitors Failed - 07/30/2022  8:47 AM      Failed - Manual Review: Labs are only required if the patient has taken medication for more than 8 weeks.      Failed - Cr in normal range and within 360 days    Creat  Date Value Ref Range Status  06/15/2017 0.86 0.50 - 1.10 mg/dL Final   Creatinine, Ser  Date Value Ref Range Status  06/14/2021 0.86 0.57 - 1.00 mg/dL Final         Failed - AST in normal range and within 360 days    AST  Date Value Ref Range Status  08/09/2018 21 0 - 40 IU/L Final   SGOT(AST)  Date Value Ref Range Status  01/31/2013 22 15 - 37 Unit/L Final         Failed - ALT in normal range and within 360 days    ALT  Date Value Ref Range Status  08/09/2018 26 0 - 32 IU/L Final   SGPT (ALT)  Date Value Ref Range Status  01/31/2013 29 12 - 78 U/L Final         Failed - eGFR is 30 or above and within 360 days    GFR, Est African American  Date Value Ref Range Status  06/15/2017 95 > OR = 60 mL/min/1.55m Final   GFR calc Af Amer  Date Value Ref Range Status  06/19/2019 78 >59 mL/min/1.73 Final   GFR, Est Non African American  Date Value Ref Range Status  06/15/2017 82 > OR = 60 mL/min/1.743mFinal   GFR calc non Af Amer  Date Value Ref Range Status  06/19/2019 68 >59 mL/min/1.73 Final   GFR  Date Value Ref Range Status  05/28/2012 107.22 >60.00 mL/min Final   eGFR  Date Value Ref Range Status  06/14/2021 83 >59 mL/min/1.73 Final         Failed - Valid encounter within last 12 months     Recent Outpatient Visits           1 year ago GiLesterDonald E, MD   1 year ago COHelenaDonald E, MD   2 years ago Hypothyroidism, unspecified type   [facility]   2 years ago COCOVID-42irus infection   [facility]   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   [facility]       Future Appointments             In 3 months MacDiarmid, ScNicki ReaperMD [facility] [place]            Passed - HGB in normal range and within 360 days    Hemoglobin  Date Value Ref Range Status  07/12/2022 14.7 12.0 - 15.0 g/dL Final  08/09/2018 14.0 11.1 - 15.9 g/dL Final  Passed - HCT in normal range and within 360 days    HCT  Date Value Ref Range Status  07/12/2022 45.5 36.0 - 46.0 % Final   Hematocrit  Date Value Ref Range Status  08/09/2018 42.4 34.0 - 46.6 % Final         Passed - Patient is not pregnant

## 2022-09-23 ENCOUNTER — Other Ambulatory Visit: Payer: Self-pay | Admitting: Family Medicine

## 2022-09-25 NOTE — Telephone Encounter (Signed)
Requested medication (s) are due for refill today: yes  Requested medication (s) are on the active medication list: yes  Last refill:  07/07/22  Future visit scheduled: yes  Notes to clinic:  Unable to refill per protocol, cannot delegate.      Requested Prescriptions  Pending Prescriptions Disp Refills   tiZANidine (ZANAFLEX) 4 MG tablet [Pharmacy Med Name: TIZANIDINE HCL 4 MG TABLET] 180 tablet 3    Sig: TAKE 1 TABLET BY MOUTH 2 TIMES DAILY.     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

## 2022-11-27 ENCOUNTER — Ambulatory Visit: Payer: BC Managed Care – PPO | Admitting: Urology

## 2022-11-29 ENCOUNTER — Other Ambulatory Visit: Payer: Self-pay | Admitting: Family Medicine

## 2022-11-29 DIAGNOSIS — K219 Gastro-esophageal reflux disease without esophagitis: Secondary | ICD-10-CM

## 2022-12-11 ENCOUNTER — Ambulatory Visit: Payer: BC Managed Care – PPO | Admitting: Urology

## 2022-12-18 ENCOUNTER — Encounter: Payer: Self-pay | Admitting: Urology

## 2022-12-18 ENCOUNTER — Ambulatory Visit: Payer: BC Managed Care – PPO | Admitting: Urology

## 2022-12-18 VITALS — BP 145/90 | HR 77 | Ht 62.0 in | Wt 105.0 lb

## 2022-12-18 DIAGNOSIS — R339 Retention of urine, unspecified: Secondary | ICD-10-CM

## 2022-12-18 NOTE — Progress Notes (Signed)
12/18/2022 2:00 PM   Kayla Curry May 17, 1971 [id number]  Referring provider: Jerl Mina, MD [address] [facility] [place],  [place] [address]  Chief Complaint  Patient presents with   Follow-up    follow-up    HPI: Patient has complex incomplete bladder emptying and just had her InterStim replaced and it went very well on October 18, 2021   She is a bit sore in the back but she is a petite lady.  Incisions look perfect.  I had located the IPG to the other side more lateral and soft tissue first overlying the sacrum.  Reassess durability in 4 months  Today Very well.  No need to catheterize.  Very happy.  No incision issues.  No change in frequency.  Not no pain   PMH: Past Medical History:  Diagnosis Date   Adrenal adenoma, right    Arthritis    Asthma    Chronic pain syndrome    COVID-09 August 2019 and January 2022   GERD (gastroesophageal reflux disease)    History of kidney stones    History of pelvic fracture    History of supraventricular tachycardia    PER PT ON 02-28-2013 NO ISSUES SINCE ABLATION IN 2005   Hypothyroidism    Injury of pelvis or lower limb peripheral nerve, late effect    HX PELVIC FX-- RESIDUAL URINARY RETENTION   Magnesium metabolism disorder NEPHROLOGIST-  DR Einar Gip ([facility] [place])   [facility] SYNDROME   Malfunction of device    INTERSTIM   Neurogenic bladder    Pneumonia    PONV (postoperative nausea and vomiting)    reports PONV and rash with propofol   S/P ablation of ventricular arrhythmia HX SVT--  2005   CARDIOLOGIST - [facility]--  LAST VISIT 2012 -- PT RELEASED FROM CARE ON PRN BASIS   Self-catheterizes urinary bladder    Urinary retention    RESIDUAL FROM PELVIC FX INJURY---Self cath TID and prn   Wears contact lenses     Surgical History: Past Surgical History:  Procedure Laterality Date   APPENDECTOMY  1994   EXPL. LAP.   CARDIAC ELECTROPHYSIOLOGY STUDY AND ABLATION  2005-  AT [facility]   SVT--  PT STATES NO ISSUES SINCE    ESOPHAGOGASTRODUODENOSCOPY (EGD) WITH PROPOFOL N/A 11/02/2017   Procedure: ESOPHAGOGASTRODUODENOSCOPY (EGD) WITH PROPOFOL;  Surgeon: Midge Minium, MD;  [facility];  Service: Endoscopy;  Laterality: N/A;   INTERSTIM IMPLANT PLACEMENT  [facility]   INTERSTIM IMPLANT PLACEMENT  10/05/2011   Procedure: Leane Platt IMPLANT FIRST STAGE;  Surgeon: Martina Sinner, MD;  Location: [facility];  Service: Urology;  Laterality: N/A;   INTERSTIM IMPLANT PLACEMENT  10/05/2011   Procedure: Leane Platt IMPLANT SECOND STAGE;  Surgeon: Martina Sinner, MD;  Location: [facility];  Service: Urology;  Laterality: N/A;   INTERSTIM IMPLANT PLACEMENT N/A 11/13/2016   Procedure: Leane Platt IMPLANT FIRST STAGE;  Surgeon: Alfredo Martinez, MD;  Location[facility];  Service: Urology;  Laterality: N/A;   INTERSTIM IMPLANT PLACEMENT N/A 11/13/2016   Procedure: Leane Platt IMPLANT SECOND STAGE;  Surgeon: Alfredo Martinez, MD;  Location: [facility];  Service: Urology;  Laterality: N/A;   INTERSTIM IMPLANT PLACEMENT N/A 07/18/2022   Procedure: REPLACE INTERSTIM IMPLANT FIRST STAGE;  Surgeon: Alfredo Martinez, MD;  [facility];  Service: Urology;  Laterality: N/A;   INTERSTIM IMPLANT PLACEMENT N/A 07/18/2022   Procedure:  REPLACE INTERSTIM IMPLANT SECOND STAGE AND IMPEDANCE CHECK;  Surgeon: Alfredo Martinez, MD;  [facility];  Service: Urology;  Laterality: N/A;  90 MINS FOR CASE   INTERSTIM IMPLANT REMOVAL N/A 11/13/2016   Procedure: REMOVAL OF INTERSTIM IMPLANT ONE AND TWO;  Surgeon: Alfredo Martinez, MD;  Location[facility];  Service: Urology;  Laterality: N/A;   INTERSTIM IMPLANT REVISION N/A 03/06/2013   Procedure: REVISION OF Leane Platt;  Surgeon: Martina Sinner, MD;  Location: [facility];  Service: Urology;  Laterality: N/A;   LAPAROSCOPIC  CHOLECYSTECTOMY  2001   TONSILLECTOMY  1998   TOTAL VAGINAL HYSTERECTOMY  2003   by patient report due to uterine prolapse. Advised she does not need CCa Screening    Home Medications:  Allergies as of 12/18/2022       Reactions   Advair Diskus [fluticasone-salmeterol] Shortness Of Breath   Albuterol Sulfate Shortness Of Breath   Atrovent Shortness Of Breath   Bethanechol Shortness Of Breath, Rash, Other (See Comments)   RESPIRATORY DISTRESS   Budesonide-formoterol Fumarate Shortness Of Breath   Influenza Vac Split Quad Anaphylaxis   Influenza Vaccines Anaphylaxis, Shortness Of Breath   Ipratropium Shortness Of Breath   Xopenex [levalbuterol] Shortness Of Breath   Compazine Other (See Comments)   DYSTONIC   Fluoride Preparations    Unknown reaction    Lorazepam Other (See Comments)   "HAS OPPOSITE EFFECT"   Levofloxacin Rash   Potassium-containing Compounds Nausea And Vomiting   Just the po potassium  No problem with iv. Tolerates tablets with phenergan    Propofol Nausea And Vomiting, Rash   Zofran [ondansetron Hcl] Rash        Medication List        Accurate as of December 18, 2022  2:00 PM. If you have any questions, ask your nurse or doctor.          acetaminophen 500 MG tablet Commonly known as: TYLENOL Take 1,000 mg by mouth every 6 (six) hours as needed for moderate pain.   baclofen 20 MG tablet Commonly known as: LIORESAL TAKE 1 TABLET BY MOUTH 4 TIMES DAILY.   celecoxib 100 MG capsule Commonly known as: CELEBREX TAKE 1 CAPSULE BY MOUTH TWICE A DAY   EPINEPHrine 0.3 mg/0.3 mL Soaj injection Commonly known as: EPI-PEN Inject 0.3 mg into the muscle as needed for anaphylaxis.   esomeprazole 40 MG capsule Commonly known as: NEXIUM TAKE 1 CAPSULE BY MOUTH EVERY DAY BEFORE BREAKFAST   Klor-Con M20 20 MEQ tablet Generic drug: potassium chloride SA TAKE 1 TABLET BY MOUTH 3 TIMES DAILY.   levothyroxine 75 MCG tablet Commonly known as: SYNTHROID Take 1  tablet (75 mcg total) by mouth daily before breakfast.   magnesium oxide 400 (241.3 Mg) MG tablet Commonly known as: MAG-OX Take 1 tablet (400 mg total) by mouth 2 (two) times daily.   promethazine 25 MG tablet Commonly known as: PHENERGAN TAKE 1 TABLET (25 MG TOTAL) BY MOUTH EVERY 6 (SIX) HOURS AS NEEDED. FOR NAUSEA   tiZANidine 4 MG tablet Commonly known as: ZANAFLEX TAKE 1 TABLET (4 MG TOTAL) BY MOUTH 2 (TWO) TIMES DAILY.        Allergies:  Allergies  Allergen Reactions   Advair Diskus [Fluticasone-Salmeterol] Shortness Of Breath   Albuterol Sulfate Shortness Of Breath   Atrovent Shortness Of Breath   Bethanechol Shortness Of Breath, Rash and Other (See Comments)    RESPIRATORY DISTRESS   Budesonide-Formoterol Fumarate Shortness Of Breath  Influenza Vac Split Quad Anaphylaxis   Influenza Vaccines Anaphylaxis and Shortness Of Breath   Ipratropium Shortness Of Breath   Xopenex [Levalbuterol] Shortness Of Breath   Compazine Other (See Comments)    DYSTONIC   Fluoride Preparations     Unknown reaction    Lorazepam Other (See Comments)    "HAS OPPOSITE EFFECT"   Levofloxacin Rash   Potassium-Containing Compounds Nausea And Vomiting    Just the po potassium  No problem with iv. Tolerates tablets with phenergan    Propofol Nausea And Vomiting and Rash   Zofran [Ondansetron Hcl] Rash    Family History: Family History  Problem Relation Age of Onset   Hypertension Father    Colon cancer Neg Hx     Social History:  reports that she has never smoked. She has never been exposed to tobacco smoke. She has never used smokeless tobacco. She reports current alcohol use. She reports that she does not use drugs.  ROS:                                        Physical Exam: BP (!) 145/90   Pulse 77   Ht 5\' 2"  (1.575 m)   Wt 47.6 kg   BMI 19.20 kg/m   Constitutional:  Alert and oriented, No acute distress.  Laboratory Data: Lab Results  Component  Value Date   WBC 8.6 07/12/2022   HGB 14.7 07/12/2022   HCT 45.5 07/12/2022   MCV 93.0 07/12/2022   PLT 322 07/12/2022    Lab Results  Component Value Date   CREATININE 0.86 06/14/2021    No results found for: "PSA"  No results found for: "TESTOSTERONE"  No results found for: "HGBA1C"  Urinalysis    Component Value Date/Time   APPEARANCEUR Clear 10/10/2016 1428   GLUCOSEU Negative 10/10/2016 1428   BILIRUBINUR Negative 06/14/2021 1534   BILIRUBINUR Negative 10/10/2016 1428   PROTEINUR Positive (A) 06/14/2021 1534   PROTEINUR Negative 10/10/2016 1428   UROBILINOGEN 0.2 06/14/2021 1534   NITRITE Negative 06/14/2021 1534   NITRITE Negative 10/10/2016 1428   LEUKOCYTESUR Moderate (2+) (A) 06/14/2021 1534   LEUKOCYTESUR Negative 10/10/2016 1428    Pertinent Imaging:   Assessment & Plan: Assess 1 year  1. Urinary retention  - Urinalysis, Complete   No follow-ups on file.  Martina Sinner, MD  [facility] [address], [address] [place], [place] [address] [phone]

## 2022-12-18 NOTE — Addendum Note (Signed)
Addended by: Consuella Lose on: 12/18/2022 04:23 PM   Modules accepted: Orders

## 2023-01-08 ENCOUNTER — Emergency Department
Admission: EM | Admit: 2023-01-08 | Discharge: 2023-01-08 | Discharge status: Against Medical Advice | Payer: BC Managed Care – PPO | Attending: Emergency Medicine | Admitting: Emergency Medicine

## 2023-01-08 DIAGNOSIS — L089 Local infection of the skin and subcutaneous tissue, unspecified: Secondary | ICD-10-CM | POA: Diagnosis present

## 2023-01-08 DIAGNOSIS — Z5321 Procedure and treatment not carried out due to patient leaving prior to being seen by health care provider: Secondary | ICD-10-CM | POA: Diagnosis not present

## 2023-01-08 NOTE — ED Notes (Signed)
Pt and husband came into triage room and sts " here is the blood pressure cuff. We have to go somewhere where she can get help and not have to wait. " RN unable to go over risks and benefits up to including death with pt due them swiftly walking out the door.

## 2023-01-08 NOTE — ED Triage Notes (Signed)
Pt sts that she was seen at [facility] and her dx was an infected parotid gland. Pt sts that she was started on Augmentin  and was told to come in if it got any worse.

## 2023-01-08 NOTE — ED Notes (Signed)
RN and tech unable to get blood samples. Phlebotomy called.

## 2023-04-12 ENCOUNTER — Other Ambulatory Visit: Payer: Self-pay | Admitting: Family Medicine

## 2023-04-12 DIAGNOSIS — Z1231 Encounter for screening mammogram for malignant neoplasm of breast: Secondary | ICD-10-CM

## 2023-04-25 ENCOUNTER — Ambulatory Visit
Admission: RE | Admit: 2023-04-25 | Discharge: 2023-04-25 | Disposition: A | Discharge status: Home / Self Care | Payer: BC Managed Care – PPO | Source: Ambulatory Visit | Attending: Family Medicine | Admitting: Family Medicine

## 2023-04-25 DIAGNOSIS — Z1231 Encounter for screening mammogram for malignant neoplasm of breast: Secondary | ICD-10-CM | POA: Diagnosis present

## 2023-04-30 ENCOUNTER — Other Ambulatory Visit: Payer: Self-pay | Admitting: Family Medicine

## 2023-04-30 DIAGNOSIS — R928 Other abnormal and inconclusive findings on diagnostic imaging of breast: Secondary | ICD-10-CM

## 2023-05-03 ENCOUNTER — Encounter: Payer: Self-pay | Admitting: Family Medicine

## 2023-05-03 DIAGNOSIS — N6489 Other specified disorders of breast: Secondary | ICD-10-CM

## 2023-05-03 DIAGNOSIS — R928 Other abnormal and inconclusive findings on diagnostic imaging of breast: Secondary | ICD-10-CM

## 2023-05-16 ENCOUNTER — Ambulatory Visit
Admission: RE | Admit: 2023-05-16 | Discharge: 2023-05-16 | Disposition: A | Discharge status: Home / Self Care | Payer: BC Managed Care – PPO | Source: Ambulatory Visit | Attending: Family Medicine | Admitting: Family Medicine

## 2023-05-16 DIAGNOSIS — R928 Other abnormal and inconclusive findings on diagnostic imaging of breast: Secondary | ICD-10-CM | POA: Insufficient documentation

## 2023-12-17 ENCOUNTER — Ambulatory Visit: Payer: BC Managed Care – PPO | Admitting: Urology

## 2023-12-24 ENCOUNTER — Ambulatory Visit: Payer: Self-pay | Admitting: Urology

## 2023-12-24 VITALS — BP 112/74 | HR 78 | Ht 62.0 in | Wt 115.0 lb

## 2023-12-24 DIAGNOSIS — R339 Retention of urine, unspecified: Secondary | ICD-10-CM

## 2023-12-24 LAB — MICROSCOPIC EXAMINATION

## 2023-12-24 NOTE — Progress Notes (Signed)
 12/24/2023 2:26 PM   Kayla Curry 08/14/1971 [id number]  Referring provider: Lyle San, MD [address] [facility] [place],  [place] [address]  Chief Complaint  Patient presents with   Urinary Retention    HPI: Patient has complex incomplete bladder emptying and just had her InterStim replaced and it went very well on October 18, 2021   She is a bit sore in the back but she is a petite lady.  Incisions look perfect.  I had located the IPG to the other side more lateral and soft tissue first overlying the sacrum.  Reassess durability in 4 months   Today Very well.  No need to catheterize.  Very happy.  No incision issues.  No change in frequency.  Not no pain    TOday Flow excellent.  No infections.  Frequency stable.  No pain   PMH: Past Medical History:  Diagnosis Date   Adrenal adenoma, right    Arthritis    Asthma    Chronic pain syndrome    COVID-09 August 2019 and January 2022   GERD (gastroesophageal reflux disease)    History of kidney stones    History of pelvic fracture    History of supraventricular tachycardia    PER PT ON 02-28-2013 NO ISSUES SINCE ABLATION IN 2005   Hypothyroidism    Injury of pelvis or lower limb peripheral nerve, late effect    HX PELVIC FX-- RESIDUAL URINARY RETENTION   Magnesium  metabolism disorder NEPHROLOGIST-  DR Alyson Jump ([facility] [place])   [facility] SYNDROME   Malfunction of device    INTERSTIM   Neurogenic bladder    Pneumonia    PONV (postoperative nausea and vomiting)    reports PONV and rash with propofol    S/P ablation of ventricular arrhythmia HX SVT--  2005   CARDIOLOGIST - [facility]--  LAST VISIT 2012 -- PT RELEASED FROM CARE ON PRN BASIS   Self-catheterizes urinary bladder    Urinary retention    RESIDUAL FROM PELVIC FX INJURY---Self cath TID and prn   Wears contact lenses     Surgical History: Past Surgical History:  Procedure Laterality Date   APPENDECTOMY  1994   EXPL.  LAP.   CARDIAC ELECTROPHYSIOLOGY STUDY AND ABLATION  2005- AT [facility]   SVT--  PT STATES NO ISSUES SINCE    ESOPHAGOGASTRODUODENOSCOPY (EGD) WITH PROPOFOL  N/A 11/02/2017   Procedure: ESOPHAGOGASTRODUODENOSCOPY (EGD) WITH PROPOFOL ;  Surgeon: Marnee Sink, MD;  Location: [facility];  Service: Endoscopy;  Laterality: N/A;   INTERSTIM IMPLANT PLACEMENT  2009[facility]   INTERSTIM IMPLANT PLACEMENT  10/05/2011   Procedure: Simona Dublin IMPLANT FIRST STAGE;  Surgeon: Devorah Fonder, MD;  Location: [facility];  Service: Urology;  Laterality: N/A;   INTERSTIM IMPLANT PLACEMENT  10/05/2011   Procedure: Simona Dublin IMPLANT SECOND STAGE;  Surgeon: Devorah Fonder, MD;  Location: [facility];  Service: Urology;  Laterality: N/A;   INTERSTIM IMPLANT PLACEMENT N/A 11/13/2016   Procedure: Simona Dublin IMPLANT FIRST STAGE;  Surgeon: Erman Hayward, MD;  Location: [facility];  Service: Urology;  Laterality: N/A;   INTERSTIM IMPLANT PLACEMENT N/A 11/13/2016   Procedure: Simona Dublin IMPLANT SECOND STAGE;  Surgeon: Erman Hayward, MD;  Location: [facility];  Service: Urology;  Laterality: N/A;   INTERSTIM IMPLANT PLACEMENT N/A 07/18/2022   Procedure: REPLACE INTERSTIM IMPLANT FIRST STAGE;  Surgeon: Erman Hayward, MD;  Location: [facility];  Service: Urology;  Laterality: N/A;   INTERSTIM IMPLANT PLACEMENT N/A 07/18/2022   Procedure: REPLACE INTERSTIM IMPLANT SECOND STAGE AND IMPEDANCE CHECK;  Surgeon: Erman Hayward, MD;  Location: [facility];  Service: Urology;  Laterality: N/A;  90 MINS FOR CASE   INTERSTIM IMPLANT REMOVAL N/A 11/13/2016   Procedure: REMOVAL OF INTERSTIM IMPLANT ONE AND TWO;  Surgeon: Erman Hayward, MD;  Location: [facility];  Service: Urology;  Laterality: N/A;   INTERSTIM IMPLANT REVISION N/A 03/06/2013   Procedure: REVISION OF Simona Dublin;  Surgeon: Devorah Fonder, MD;  Location: [facility];  Service: Urology;  Laterality: N/A;   LAPAROSCOPIC CHOLECYSTECTOMY  2001   TONSILLECTOMY  1998   TOTAL VAGINAL HYSTERECTOMY  2003   by patient report due to uterine prolapse. Advised she does not need CCa Screening    Home Medications:  Allergies as of 12/24/2023       Reactions   Advair Diskus [fluticasone-salmeterol] Shortness Of Breath   Albuterol Sulfate Shortness Of Breath   Atrovent Shortness Of Breath   Bethanechol Shortness Of Breath, Rash, Other (See Comments)   RESPIRATORY DISTRESS   Budesonide-formoterol Fumarate Shortness Of Breath   Influenza Vac Split Quad Anaphylaxis   Influenza Vaccines Anaphylaxis, Shortness Of Breath   Ipratropium Shortness Of Breath   Xopenex [levalbuterol] Shortness Of Breath   Compazine Other (See Comments)   DYSTONIC   Fluoride Preparations    Unknown reaction    Lorazepam Other (See Comments)   "HAS OPPOSITE EFFECT"   Levofloxacin Rash   Potassium-containing Compounds Nausea And Vomiting   Just the po potassium  No problem with iv. Tolerates tablets with phenergan     Propofol  Nausea And Vomiting, Rash   Zofran  [ondansetron  Hcl] Rash        Medication List        Accurate as of Dec 24, 2023  2:26 PM. If you have any questions, ask your nurse or doctor.          acetaminophen  500 MG tablet Commonly known as: TYLENOL  Take 1,000 mg by mouth every 6 (six) hours as needed for moderate pain.   baclofen  20 MG tablet Commonly known as: LIORESAL  TAKE 1 TABLET BY MOUTH 4 TIMES DAILY.   celecoxib  100 MG capsule Commonly known as: CELEBREX  TAKE 1 CAPSULE BY MOUTH TWICE A DAY   clonazePAM 2 MG tablet Commonly known as: KLONOPIN Take 2 mg by mouth.   EPINEPHrine  0.3 mg/0.3 mL Soaj injection Commonly known as: EPI-PEN Inject 0.3 mg into the muscle as needed for anaphylaxis.   esomeprazole  40 MG capsule Commonly known as: NEXIUM  TAKE 1 CAPSULE BY MOUTH EVERY DAY BEFORE BREAKFAST   Klor-Con  M20 20 MEQ tablet Generic  drug: potassium chloride  SA TAKE 1 TABLET BY MOUTH 3 TIMES DAILY.   levothyroxine  75 MCG tablet Commonly known as: SYNTHROID  Take 1 tablet (75 mcg total) by mouth daily before breakfast.   magnesium  oxide 400 (241.3 Mg) MG tablet Commonly known as: MAG-OX Take 1 tablet (400 mg total) by mouth 2 (two) times daily.   promethazine  25 MG tablet Commonly known as: PHENERGAN  TAKE 1 TABLET (25 MG TOTAL) BY MOUTH EVERY 6 (SIX) HOURS AS NEEDED. FOR NAUSEA   tiZANidine  4 MG tablet Commonly known as: ZANAFLEX  TAKE 1 TABLET (4 MG TOTAL) BY MOUTH 2 (TWO) TIMES DAILY.        Allergies:  Allergies  Allergen Reactions   Advair Diskus [Fluticasone-Salmeterol] Shortness Of Breath   Albuterol Sulfate Shortness Of Breath  Atrovent Shortness Of Breath   Bethanechol Shortness Of Breath, Rash and Other (See Comments)    RESPIRATORY DISTRESS   Budesonide-Formoterol Fumarate Shortness Of Breath   Influenza Vac Split Quad Anaphylaxis   Influenza Vaccines Anaphylaxis and Shortness Of Breath   Ipratropium Shortness Of Breath   Xopenex [Levalbuterol] Shortness Of Breath   Compazine Other (See Comments)    DYSTONIC   Fluoride Preparations     Unknown reaction    Lorazepam Other (See Comments)    "HAS OPPOSITE EFFECT"   Levofloxacin Rash   Potassium-Containing Compounds Nausea And Vomiting    Just the po potassium  No problem with iv. Tolerates tablets with phenergan     Propofol  Nausea And Vomiting and Rash   Zofran  [Ondansetron  Hcl] Rash    Family History: Family History  Problem Relation Age of Onset   Hypertension Father    Colon cancer Neg Hx     Social History:  reports that she has never smoked. She has never been exposed to tobacco smoke. She has never used smokeless tobacco. She reports current alcohol use. She reports that she does not use drugs.  ROS:                                        Physical Exam: BP 112/74   Pulse 78   Ht 5\' 2"  (1.575 m)    Wt 52.2 kg   BMI 21.03 kg/m   Constitutional:  Alert and oriented, No acute distress. HEENT: [place] AT, moist mucus membranes.  Trachea midline, no masses.   Laboratory Data: Lab Results  Component Value Date   WBC 8.6 07/12/2022   HGB 14.7 07/12/2022   HCT 45.5 07/12/2022   MCV 93.0 07/12/2022   PLT 322 07/12/2022    Lab Results  Component Value Date   CREATININE 0.86 06/14/2021    No results found for: "PSA"  No results found for: "TESTOSTERONE"  No results found for: "HGBA1C"  Urinalysis    Component Value Date/Time   APPEARANCEUR Clear 10/10/2016 1428   GLUCOSEU Negative 10/10/2016 1428   BILIRUBINUR Negative 06/14/2021 1534   BILIRUBINUR Negative 10/10/2016 1428   PROTEINUR Positive (A) 06/14/2021 1534   PROTEINUR Negative 10/10/2016 1428   UROBILINOGEN 0.2 06/14/2021 1534   NITRITE Negative 06/14/2021 1534   NITRITE Negative 10/10/2016 1428   LEUKOCYTESUR Moderate (2+) (A) 06/14/2021 1534   LEUKOCYTESUR Negative 10/10/2016 1428    Pertinent Imaging: Still works as a Building control surveyor.  Husband went back to the police force.  See in a year  Assessment & Plan: As noted above  1. Urinary retention (Primary)  - Urinalysis, Complete   No follow-ups on file.  Devorah Fonder, MD  [facility] [address], [address] [place], [place] [address] [phone]

## 2023-12-25 LAB — URINALYSIS, COMPLETE
Bilirubin, UA: NEGATIVE
Glucose, UA: NEGATIVE
Ketones, UA: NEGATIVE
Leukocytes,UA: NEGATIVE
Nitrite, UA: NEGATIVE
Protein,UA: NEGATIVE
RBC, UA: NEGATIVE
Specific Gravity, UA: <1.005 — ABNORMAL LOW (ref 1.005–1.030)
Urobilinogen, Ur: 0.2 mg/dL (ref 0.2–1.0)
pH, UA: 6 (ref 5.0–7.5)

## 2023-12-25 LAB — MICROSCOPIC EXAMINATION

## 2024-01-15 ENCOUNTER — Telehealth: Payer: Self-pay | Admitting: Urology
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
## 2024-01-18 NOTE — Telephone Encounter (Signed)
 Based on the mechanism, I suspect something about the device itself has been disrupted. I think Dr. Clarke Crouch and I can manage her ourselves for right now.  I was able to get a copy of her [facility] x-rays. Let's book her for an appointment with me to check for impedance and then get her on Dr. Ned Balint schedule ASAP to discuss possible lead replacement based on results.

## 2024-01-18 NOTE — Telephone Encounter (Signed)
 Pt informed. Appt scheduled for 01/28/2024. Waiting on [employer] to response for representative availability.

## 2024-01-28 ENCOUNTER — Other Ambulatory Visit
Admission: RE | Admit: 2024-01-28 | Discharge: 2024-01-28 | Disposition: A | Discharge status: Home / Self Care | Source: Ambulatory Visit | Attending: Physician Assistant | Admitting: Physician Assistant

## 2024-01-28 ENCOUNTER — Ambulatory Visit: Admitting: Physician Assistant

## 2024-01-28 VITALS — Ht 62.0 in | Wt 110.0 lb

## 2024-01-28 DIAGNOSIS — T859XXD Unspecified complication of internal prosthetic device, implant and graft, subsequent encounter: Secondary | ICD-10-CM | POA: Insufficient documentation

## 2024-01-28 DIAGNOSIS — T859XXA Unspecified complication of internal prosthetic device, implant and graft, initial encounter: Secondary | ICD-10-CM | POA: Diagnosis not present

## 2024-01-28 LAB — BASIC METABOLIC PANEL WITH GFR
Anion gap: 6 (ref 5–15)
BUN: 34 mg/dL — ABNORMAL HIGH (ref 6–20)
CO2: 15 mmol/L — ABNORMAL LOW (ref 22–32)
Calcium: 9.3 mg/dL (ref 8.9–10.3)
Chloride: 114 mmol/L — ABNORMAL HIGH (ref 98–111)
Creatinine, Ser: 1.36 mg/dL — ABNORMAL HIGH (ref 0.44–1.00)
GFR, Estimated: 47 mL/min — ABNORMAL LOW (ref >60)
Glucose, Bld: 100 mg/dL — ABNORMAL HIGH (ref 70–99)
Potassium: 4.6 mmol/L (ref 3.5–5.1)
Sodium: 135 mmol/L (ref 135–145)
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
## 2024-01-29 ENCOUNTER — Ambulatory Visit: Payer: Self-pay | Admitting: Physician Assistant

## 2024-01-29 LAB — URINALYSIS, COMPLETE
Bilirubin, UA: NEGATIVE
Glucose, UA: NEGATIVE
Ketones, UA: NEGATIVE
Leukocytes,UA: NEGATIVE
Nitrite, UA: NEGATIVE
Specific Gravity, UA: 1.025 (ref 1.005–1.030)
Urobilinogen, Ur: 0.2 mg/dL (ref 0.2–1.0)
pH, UA: 6 (ref 5.0–7.5)

## 2024-01-29 LAB — MICROSCOPIC EXAMINATION: Epithelial Cells (non renal): >10 /HPF — AB (ref 0–10)

## 2024-02-04 NOTE — Telephone Encounter (Signed)
 Please let her know that I spoke with Dr. Clarke Crouch about her.  He is pleased that her bladder scan was normal when I saw her.  He recommends outpatient follow-up with him, soonest available.  Please schedule, okay to defer until August if that is how far he is booking out.

## 2024-02-05 NOTE — Telephone Encounter (Signed)
 PT called office asking if we would send a message to Dr. Clarke Crouch to discuss surgery.  She would like to get this scheduled sooner than later.  Her interstim isn't working and she has to self cath.  She said she can't wait until September.

## 2024-02-11 ENCOUNTER — Ambulatory Visit (INDEPENDENT_AMBULATORY_CARE_PROVIDER_SITE_OTHER): Admitting: Urology

## 2024-02-11 VITALS — BP 104/68 | HR 69 | Ht 62.0 in | Wt 110.0 lb

## 2024-02-11 DIAGNOSIS — R339 Retention of urine, unspecified: Secondary | ICD-10-CM | POA: Diagnosis not present
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
## 2024-02-11 NOTE — Progress Notes (Signed)
 Patient ID: Kayla Curry, female   DOB: 10/01/1970, 53 y.o.   MRN: [id number]

## 2024-02-26 ENCOUNTER — Telehealth: Payer: Self-pay | Admitting: Urology

## 2024-02-26 NOTE — Telephone Encounter (Signed)
 Pt called and would like to schedule a post op. Pt had surgery done in [facility] but would like to schedule her POST-OP in [place]. Pt sees [facility] for Office Visits with Macdiarmid but has her surgeries scheduled in [place] with [facility]. Pt was last seen in office 02/11/2024.  Pt would like to receive a call back on clarification.

## 2024-02-27 NOTE — Telephone Encounter (Signed)
 Pt called back and she is scheduled with Sam on the 21st. She said that is when the post op appt was due and MacDiarmid is on vacation that day.

## 2024-03-10 ENCOUNTER — Ambulatory Visit (INDEPENDENT_AMBULATORY_CARE_PROVIDER_SITE_OTHER): Admitting: Physician Assistant

## 2024-03-10 VITALS — BP 117/77 | HR 92 | Ht 62.0 in | Wt 107.0 lb

## 2024-03-10 DIAGNOSIS — N398 Other specified disorders of urinary system: Secondary | ICD-10-CM

## 2024-03-10 LAB — BLADDER SCAN AMB NON-IMAGING

## 2024-03-10 NOTE — Progress Notes (Signed)
 Patient presents today for postop wound check after device exchange with Dr. Gaston about 2 weeks ago.  She reports she is doing well, though she is having to run the device at a higher intensity than she did previously.  Her straining has significantly improved.  She is having some irritation at the left lateral apex of her incision, but denies fevers or purulent drainage.  On visual inspection, her incisions are well-healing.  At the left lateral apex, her suture knot is protruding from the incision and I expect that her skin will split this piece out.  No evidence of wound infection or separation.  She may keep scheduled follow-up with Dr. Gaston next year, or return sooner as needed.

## 2024-04-30 ENCOUNTER — Telehealth: Payer: Self-pay | Admitting: Urology

## 2024-04-30 NOTE — Telephone Encounter (Signed)
 pt states she is meeting up with an intersim rep at our clinic and they will check her interstim to make sure it's wokring right and then the rep with talk to Dr.MacDiarmid, I told the pt that he has a full clinic on Monday. So idk if he will be able to talk to them because she's not on the schedule. she didn't want to wait until 11/24 (his next available) and didn't want to see a PA so she called an interstim rep

## 2024-04-30 NOTE — Telephone Encounter (Signed)
 Patient called and reported that the device implanted by Dr. Gaston is not functioning effectively. She is experiencing urinary retention despite having the device set to the highest setting. The patient is requesting an earlier appointment and is unwilling to wait until the next available slot on 07/14/24. Additionally, she does not prefer seeing a  PA.

## 2024-05-13 ENCOUNTER — Telehealth: Payer: Self-pay

## 2024-05-13 NOTE — Telephone Encounter (Signed)
 Patient called stating that her interstim device is not working properly, even after seeing the rep at our clinic. Patient is stating that the rep spoke with Dr. McDiarmid and Dr. McDiarmid said to schedule an appointment with him for as soon as possible. Please advise patient.

## 2024-05-14 NOTE — Telephone Encounter (Signed)
 Pt called stating she would like to be seen due to her interstim not working properly. Pt was offered a visit with Sam our PA. Pt declined visit and states that an interstim rep spoke with Dr.MacDiarmid and wanted to see if we would schedule sooner than December 8th. Please advise.

## 2024-05-14 NOTE — Telephone Encounter (Signed)
 Appt scheduled. Pt aware

## 2024-05-14 NOTE — Telephone Encounter (Signed)
 See separate encounter

## 2024-05-19 ENCOUNTER — Ambulatory Visit (INDEPENDENT_AMBULATORY_CARE_PROVIDER_SITE_OTHER): Admitting: Urology

## 2024-05-19 VITALS — BP 121/75 | HR 70 | Ht 62.0 in | Wt 105.6 lb

## 2024-05-19 DIAGNOSIS — N398 Other specified disorders of urinary system: Secondary | ICD-10-CM

## 2024-05-19 NOTE — Addendum Note (Signed)
 Addended by: CLYDELL GLATTER A on: 05/19/2024 09:36 AM   Modules accepted: Orders
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
## 2024-05-19 NOTE — Addendum Note (Signed)
 Addended by: CLEOTILDE CAMELIA MATSU on: 05/19/2024 09:53 AM   Modules accepted: Orders

## 2024-05-20 ENCOUNTER — Ambulatory Visit
Admission: RE | Admit: 2024-05-20 | Discharge: 2024-05-20 | Disposition: A | Discharge status: Home / Self Care | Source: Ambulatory Visit | Attending: Urology | Admitting: Urology

## 2024-05-20 ENCOUNTER — Ambulatory Visit
Admission: RE | Admit: 2024-05-20 | Discharge: 2024-05-20 | Disposition: A | Source: Ambulatory Visit | Attending: Urology | Admitting: Urology

## 2024-05-20 DIAGNOSIS — N398 Other specified disorders of urinary system: Secondary | ICD-10-CM | POA: Insufficient documentation

## 2024-05-26 ENCOUNTER — Ambulatory Visit (INDEPENDENT_AMBULATORY_CARE_PROVIDER_SITE_OTHER): Admitting: Urology

## 2024-05-26 VITALS — BP 143/78 | HR 81

## 2024-05-26 DIAGNOSIS — N398 Other specified disorders of urinary system: Secondary | ICD-10-CM | POA: Diagnosis not present

## 2024-05-26 DIAGNOSIS — R339 Retention of urine, unspecified: Secondary | ICD-10-CM | POA: Diagnosis not present

## 2024-05-26 LAB — MICROSCOPIC EXAMINATION: WBC, UA: >30 /HPF — AB (ref 0–5)

## 2024-05-26 LAB — URINALYSIS, COMPLETE
Bilirubin, UA: NEGATIVE
Glucose, UA: NEGATIVE
Ketones, UA: NEGATIVE
Nitrite, UA: NEGATIVE
Specific Gravity, UA: 1.02 (ref 1.005–1.030)
Urobilinogen, Ur: 0.2 mg/dL (ref 0.2–1.0)
pH, UA: 6 (ref 5.0–7.5)

## 2024-05-26 NOTE — Progress Notes (Signed)
 05/26/2024 9:27 AM   [name] Dec 10, 1970 [id number]  Referring provider: Stanton Lynwood FALCON, MD [address] [facility] [place],  [place] [address]  Chief Complaint  Patient presents with   Follow-up    HPI: Reviewed last week's note.  I had to readvance and manipulate the white trocar multiple times as noted as the lead would not curve downward but the responses were excellent.  Intraoperatively I did not feel like to get a better position.  I reviewed her x-rays from last week and the leads in the same position.  There was no obvious fracture of the lead.   Patient reports in the last week she is voiding better.  She stopped self catheterizing and wondered if that allowed her bladder to feel better and she can void better.  She has a device turned up quite high.  In the last few days she had little bit of burning.   PMH: Past Medical History:  Diagnosis Date   Adrenal adenoma, right    Arthritis    Asthma    Chronic pain syndrome    COVID-09 August 2019 and January 2022   GERD (gastroesophageal reflux disease)    History of kidney stones    History of pelvic fracture    History of supraventricular tachycardia    PER PT ON 02-28-2013 NO ISSUES SINCE ABLATION IN 2005   Hypothyroidism    Injury of pelvis or lower limb peripheral nerve, late effect    HX PELVIC FX-- RESIDUAL URINARY RETENTION   Magnesium  metabolism disorder NEPHROLOGIST-  DR MABELENE ([facility] [place])   [facility] SYNDROME   Malfunction of device    INTERSTIM   Neurogenic bladder    Pneumonia    PONV (postoperative nausea and vomiting)    reports PONV and rash with propofol    S/P ablation of ventricular arrhythmia HX SVT--  2005   CARDIOLOGIST - [facility]--  LAST VISIT 2012 -- PT RELEASED FROM CARE ON PRN BASIS   Self-catheterizes urinary bladder    Urinary retention    RESIDUAL FROM PELVIC FX INJURY---Self cath TID and prn   Wears contact lenses     Surgical  History: Past Surgical History:  Procedure Laterality Date   APPENDECTOMY  1994   EXPL. LAP.   CARDIAC ELECTROPHYSIOLOGY STUDY AND ABLATION  2005- AT [facility]   SVT--  PT STATES NO ISSUES SINCE    ESOPHAGOGASTRODUODENOSCOPY (EGD) WITH PROPOFOL  N/A 11/02/2017   Procedure: ESOPHAGOGASTRODUODENOSCOPY (EGD) WITH PROPOFOL ;  Surgeon: Jinny Carmine, MD;  Location: [facility];  Service: Endoscopy;  Laterality: N/A;   INTERSTIM IMPLANT PLACEMENT  [facility]   INTERSTIM IMPLANT PLACEMENT  10/05/2011   Procedure: RENNA IMPLANT FIRST STAGE;  Surgeon: Kayla Kayla Elizabeth, MD;  Location: [facility];  Service: Urology;  Laterality: N/A;   INTERSTIM IMPLANT PLACEMENT  10/05/2011   Procedure: RENNA IMPLANT SECOND STAGE;  Surgeon: Kayla Kayla Elizabeth, MD;  Location: [facility];  Service: Urology;  Laterality: N/A;   INTERSTIM IMPLANT PLACEMENT N/A 11/13/2016   Procedure: RENNA IMPLANT FIRST STAGE;  Surgeon: Kayla Elizabeth, MD;  Location: [facility];  Service: Urology;  Laterality: N/A;   INTERSTIM IMPLANT PLACEMENT N/A 11/13/2016   Procedure: RENNA IMPLANT SECOND STAGE;  Surgeon: Kayla Elizabeth, MD;  Location: [facility];  Service: Urology;  Laterality: N/A;   INTERSTIM IMPLANT PLACEMENT N/A 07/18/2022   Procedure: REPLACE INTERSTIM IMPLANT FIRST STAGE;  Surgeon: Gaston Hamilton, MD;  Location: [facility];  Service: Urology;  Laterality: N/A;   INTERSTIM IMPLANT PLACEMENT N/A 07/18/2022   Procedure: REPLACE INTERSTIM IMPLANT SECOND STAGE AND IMPEDANCE CHECK;  Surgeon: Gaston Hamilton, MD;  Location: [facility];  Service: Urology;  Laterality: N/A;  90 MINS FOR CASE   INTERSTIM IMPLANT REMOVAL N/A 11/13/2016   Procedure: REMOVAL OF INTERSTIM IMPLANT ONE AND TWO;  Surgeon: Hamilton Gaston, MD;  Location: [facility];  Service: Urology;  Laterality: N/A;   INTERSTIM IMPLANT REVISION N/A 03/06/2013   Procedure:  REVISION OF RENNA;  Surgeon: Hamilton Kayla Gaston, MD;  Location: [facility];  Service: Urology;  Laterality: N/A;   LAPAROSCOPIC CHOLECYSTECTOMY  2001   TONSILLECTOMY  1998   TOTAL VAGINAL HYSTERECTOMY  2003   by patient report due to uterine prolapse. Advised she does not need CCa Screening    Home Medications:  Allergies as of 05/26/2024       Reactions   Advair Diskus [fluticasone-salmeterol] Shortness Of Breath   Albuterol Sulfate Shortness Of Breath   Atrovent Shortness Of Breath   Bethanechol Shortness Of Breath, Rash, Other (See Comments)   RESPIRATORY DISTRESS   Budesonide-formoterol Fumarate Shortness Of Breath   Influenza Vac Split Quad Anaphylaxis   Influenza Vaccines Anaphylaxis, Shortness Of Breath   Ipratropium Shortness Of Breath   Xopenex [levalbuterol] Shortness Of Breath   Compazine Other (See Comments)   DYSTONIC   Fluoride Preparations    Unknown reaction    Lorazepam Other (See Comments)   HAS OPPOSITE EFFECT   Levofloxacin Rash   Potassium-containing Compounds Nausea And Vomiting   Just the po potassium  No problem with iv. Tolerates tablets with phenergan     Propofol  Nausea And Vomiting, Rash   Zofran  [ondansetron  Hcl] Rash        Medication List        Accurate as of May 26, 2024  9:27 AM. If you have any questions, ask your nurse or doctor.          acetaminophen  500 MG tablet Commonly known as: TYLENOL  Take 1,000 mg by mouth every 6 (six) hours as needed for moderate pain.   baclofen  20 MG tablet Commonly known as: LIORESAL  TAKE 1 TABLET BY MOUTH 4 TIMES DAILY.   celecoxib  100 MG capsule Commonly known as: CELEBREX  TAKE 1 CAPSULE BY MOUTH TWICE A DAY   clonazePAM 2 MG tablet Commonly known as: KLONOPIN Take 2 mg by mouth.   EPINEPHrine  0.3 mg/0.3 mL Soaj injection Commonly known as: EPI-PEN Inject 0.3 mg into the muscle as needed for anaphylaxis.   esomeprazole  40 MG capsule Commonly known as:  NEXIUM  TAKE 1 CAPSULE BY MOUTH EVERY DAY BEFORE BREAKFAST   Klor-Con  M20 20 MEQ tablet Generic drug: potassium chloride  SA TAKE 1 TABLET BY MOUTH 3 TIMES DAILY.   levothyroxine  75 MCG tablet Commonly known as: SYNTHROID  Take 1 tablet (75 mcg total) by mouth daily before breakfast.   magnesium  oxide 400 (241.3 Mg) MG tablet Commonly known as: MAG-OX Take 1 tablet (400 mg total) by mouth 2 (two) times daily.   promethazine  25 MG tablet Commonly known as: PHENERGAN  TAKE 1 TABLET (25 MG TOTAL) BY MOUTH EVERY 6 (SIX) HOURS AS NEEDED. FOR NAUSEA   tiZANidine  4 MG tablet Commonly known as: ZANAFLEX  TAKE 1 TABLET (4 MG TOTAL) BY MOUTH 2 (TWO) TIMES DAILY.        Allergies:  Allergies  Allergen Reactions   Advair Diskus [Fluticasone-Salmeterol]  Shortness Of Breath   Albuterol Sulfate Shortness Of Breath   Atrovent Shortness Of Breath   Bethanechol Shortness Of Breath, Rash and Other (See Comments)    RESPIRATORY DISTRESS   Budesonide-Formoterol Fumarate Shortness Of Breath   Influenza Vac Split Quad Anaphylaxis   Influenza Vaccines Anaphylaxis and Shortness Of Breath   Ipratropium Shortness Of Breath   Xopenex [Levalbuterol] Shortness Of Breath   Compazine Other (See Comments)    DYSTONIC   Fluoride Preparations     Unknown reaction    Lorazepam Other (See Comments)    HAS OPPOSITE EFFECT   Levofloxacin Rash   Potassium-Containing Compounds Nausea And Vomiting    Just the po potassium  No problem with iv. Tolerates tablets with phenergan     Propofol  Nausea And Vomiting and Rash   Zofran  [Ondansetron  Hcl] Rash    Family History: Family History  Problem Relation Age of Onset   Hypertension Father    Colon cancer Neg Hx     Social History:  reports that she has never smoked. She has never been exposed to tobacco smoke. She has never used smokeless tobacco. She reports current alcohol use. She reports that she does not use drugs.  ROS:                                         Physical Exam: BP (!) 143/78 (BP Location: Left Arm, Patient Position: Sitting, Cuff Size: Normal)   Pulse 81   SpO2 98%   Constitutional:  Alert and oriented, No acute distress. HEENT: [place] AT, moist mucus membranes.  Trachea midline, no masses.   Laboratory Data: Lab Results  Component Value Date   WBC 8.6 07/12/2022   HGB 14.7 07/12/2022   HCT 45.5 07/12/2022   MCV 93.0 07/12/2022   PLT 322 07/12/2022    Lab Results  Component Value Date   CREATININE 1.36 (H) 01/28/2024    No results found for: PSA  No results found for: TESTOSTERONE  No results found for: HGBA1C  Urinalysis    Component Value Date/Time   APPEARANCEUR Clear 01/28/2024 1627   GLUCOSEU Negative 01/28/2024 1627   BILIRUBINUR Negative 01/28/2024 1627   PROTEINUR 2+ (A) 01/28/2024 1627   UROBILINOGEN 0.2 06/14/2021 1534   NITRITE Negative 01/28/2024 1627   LEUKOCYTESUR Negative 01/28/2024 1627    Pertinent Imaging: Urine reviewed and sent for culture  Assessment & Plan: Call if urine culture positive.  Continue with InterStim.  She understands that I concerned that if she had another revision it will not reach her treatment goal.  A lot of time was spent intraoperatively during the last case.  I will assess patient in her next appointment in May.  We were both glad it was working better  1. Dysfunctional voiding of urine (Primary)  - Urinalysis, Complete   No follow-ups on file.  Kayla Kayla Elizabeth, MD  [facility] [address] [place], [place] [address] [phone]

## 2024-05-30 ENCOUNTER — Ambulatory Visit: Payer: Self-pay

## 2024-05-30 DIAGNOSIS — N398 Other specified disorders of urinary system: Secondary | ICD-10-CM

## 2024-05-30 LAB — CULTURE, URINE COMPREHENSIVE

## 2024-05-30 MED ORDER — CIPROFLOXACIN HCL 250 MG PO TABS: 250.0000 mg | ORAL_TABLET | Freq: Two times a day (BID) | ORAL | 0 refills | Status: AC

## 2024-12-29 ENCOUNTER — Ambulatory Visit: Admitting: Urology
